# Patient Record
Sex: Male | Born: 1945 | Race: White | Hispanic: No | Marital: Married | Smoking: Never smoker
Health system: Southern US, Community
[De-identification: ages and names within clinical notes are randomized; demographics above are authoritative.]

## PROBLEM LIST (undated history)

## (undated) DIAGNOSIS — I2510 Atherosclerotic heart disease of native coronary artery without angina pectoris: Secondary | ICD-10-CM

## (undated) DIAGNOSIS — I722 Aneurysm of renal artery: Secondary | ICD-10-CM

## (undated) DIAGNOSIS — I679 Cerebrovascular disease, unspecified: Secondary | ICD-10-CM

## (undated) DIAGNOSIS — F329 Major depressive disorder, single episode, unspecified: Secondary | ICD-10-CM

## (undated) DIAGNOSIS — I6381 Other cerebral infarction due to occlusion or stenosis of small artery: Secondary | ICD-10-CM

## (undated) DIAGNOSIS — G459 Transient cerebral ischemic attack, unspecified: Secondary | ICD-10-CM

## (undated) DIAGNOSIS — E785 Hyperlipidemia, unspecified: Secondary | ICD-10-CM

## (undated) DIAGNOSIS — I779 Disorder of arteries and arterioles, unspecified: Secondary | ICD-10-CM

## (undated) DIAGNOSIS — I10 Essential (primary) hypertension: Secondary | ICD-10-CM

## (undated) DIAGNOSIS — E119 Type 2 diabetes mellitus without complications: Secondary | ICD-10-CM

## (undated) DIAGNOSIS — J439 Emphysema, unspecified: Secondary | ICD-10-CM

## (undated) DIAGNOSIS — H9190 Unspecified hearing loss, unspecified ear: Secondary | ICD-10-CM

## (undated) DIAGNOSIS — I4891 Unspecified atrial fibrillation: Secondary | ICD-10-CM

## (undated) DIAGNOSIS — F32A Depression, unspecified: Secondary | ICD-10-CM

## (undated) HISTORY — DX: Emphysema, unspecified: J43.9

## (undated) HISTORY — DX: Hyperlipidemia, unspecified: E78.5

## (undated) HISTORY — DX: Depression, unspecified: F32.A

## (undated) HISTORY — DX: Cerebrovascular disease, unspecified: I67.9

## (undated) HISTORY — DX: Other cerebral infarction due to occlusion or stenosis of small artery: I63.81

## (undated) HISTORY — DX: Major depressive disorder, single episode, unspecified: F32.9

---

## 2012-08-06 HISTORY — PX: COLONOSCOPY: SHX174

## 2012-11-17 HISTORY — PX: SIGMOIDOSCOPY: SHX6686

## 2013-11-12 ENCOUNTER — Ambulatory Visit: Payer: Self-pay

## 2014-04-08 DIAGNOSIS — Z7901 Long term (current) use of anticoagulants: Secondary | ICD-10-CM

## 2014-04-08 DIAGNOSIS — E785 Hyperlipidemia, unspecified: Secondary | ICD-10-CM | POA: Insufficient documentation

## 2014-04-08 DIAGNOSIS — I10 Essential (primary) hypertension: Secondary | ICD-10-CM

## 2014-04-08 HISTORY — DX: Essential (primary) hypertension: I10

## 2014-04-08 HISTORY — DX: Long term (current) use of anticoagulants: Z79.01

## 2014-04-09 HISTORY — PX: CAROTID ENDARTERECTOMY: SUR193

## 2015-05-04 DIAGNOSIS — E785 Hyperlipidemia, unspecified: Secondary | ICD-10-CM | POA: Insufficient documentation

## 2015-05-04 DIAGNOSIS — I722 Aneurysm of renal artery: Secondary | ICD-10-CM

## 2015-05-04 DIAGNOSIS — I482 Chronic atrial fibrillation, unspecified: Secondary | ICD-10-CM

## 2015-05-04 HISTORY — DX: Hyperlipidemia, unspecified: E78.5

## 2015-05-04 HISTORY — DX: Chronic atrial fibrillation, unspecified: I48.20

## 2015-05-04 HISTORY — DX: Aneurysm of renal artery: I72.2

## 2015-11-10 DIAGNOSIS — M7742 Metatarsalgia, left foot: Secondary | ICD-10-CM | POA: Diagnosis not present

## 2015-11-10 DIAGNOSIS — M2042 Other hammer toe(s) (acquired), left foot: Secondary | ICD-10-CM | POA: Diagnosis not present

## 2015-12-07 DIAGNOSIS — I10 Essential (primary) hypertension: Secondary | ICD-10-CM | POA: Diagnosis not present

## 2015-12-07 DIAGNOSIS — I722 Aneurysm of renal artery: Secondary | ICD-10-CM | POA: Diagnosis not present

## 2015-12-07 DIAGNOSIS — I482 Chronic atrial fibrillation: Secondary | ICD-10-CM | POA: Diagnosis not present

## 2015-12-07 DIAGNOSIS — I6523 Occlusion and stenosis of bilateral carotid arteries: Secondary | ICD-10-CM | POA: Diagnosis not present

## 2015-12-15 DIAGNOSIS — I129 Hypertensive chronic kidney disease with stage 1 through stage 4 chronic kidney disease, or unspecified chronic kidney disease: Secondary | ICD-10-CM | POA: Diagnosis not present

## 2015-12-15 DIAGNOSIS — E559 Vitamin D deficiency, unspecified: Secondary | ICD-10-CM | POA: Diagnosis not present

## 2015-12-15 DIAGNOSIS — E1122 Type 2 diabetes mellitus with diabetic chronic kidney disease: Secondary | ICD-10-CM | POA: Diagnosis not present

## 2015-12-15 DIAGNOSIS — I1310 Hypertensive heart and chronic kidney disease without heart failure, with stage 1 through stage 4 chronic kidney disease, or unspecified chronic kidney disease: Secondary | ICD-10-CM | POA: Diagnosis not present

## 2015-12-15 DIAGNOSIS — N182 Chronic kidney disease, stage 2 (mild): Secondary | ICD-10-CM | POA: Diagnosis not present

## 2015-12-15 DIAGNOSIS — E785 Hyperlipidemia, unspecified: Secondary | ICD-10-CM | POA: Diagnosis not present

## 2015-12-15 DIAGNOSIS — M109 Gout, unspecified: Secondary | ICD-10-CM | POA: Diagnosis not present

## 2015-12-26 DIAGNOSIS — E119 Type 2 diabetes mellitus without complications: Secondary | ICD-10-CM | POA: Diagnosis not present

## 2015-12-26 DIAGNOSIS — H40003 Preglaucoma, unspecified, bilateral: Secondary | ICD-10-CM | POA: Diagnosis not present

## 2016-11-01 DIAGNOSIS — Z79899 Other long term (current) drug therapy: Secondary | ICD-10-CM | POA: Diagnosis not present

## 2016-11-01 DIAGNOSIS — E1122 Type 2 diabetes mellitus with diabetic chronic kidney disease: Secondary | ICD-10-CM | POA: Diagnosis not present

## 2016-11-01 DIAGNOSIS — I129 Hypertensive chronic kidney disease with stage 1 through stage 4 chronic kidney disease, or unspecified chronic kidney disease: Secondary | ICD-10-CM | POA: Diagnosis not present

## 2016-11-01 DIAGNOSIS — N182 Chronic kidney disease, stage 2 (mild): Secondary | ICD-10-CM | POA: Diagnosis not present

## 2016-11-05 DIAGNOSIS — I10 Essential (primary) hypertension: Secondary | ICD-10-CM | POA: Diagnosis not present

## 2016-11-05 DIAGNOSIS — E118 Type 2 diabetes mellitus with unspecified complications: Secondary | ICD-10-CM | POA: Diagnosis not present

## 2016-11-05 DIAGNOSIS — I6523 Occlusion and stenosis of bilateral carotid arteries: Secondary | ICD-10-CM | POA: Diagnosis not present

## 2016-11-05 DIAGNOSIS — I722 Aneurysm of renal artery: Secondary | ICD-10-CM | POA: Diagnosis not present

## 2016-11-05 DIAGNOSIS — I482 Chronic atrial fibrillation: Secondary | ICD-10-CM | POA: Diagnosis not present

## 2016-11-05 DIAGNOSIS — E785 Hyperlipidemia, unspecified: Secondary | ICD-10-CM | POA: Diagnosis not present

## 2016-11-27 DIAGNOSIS — I129 Hypertensive chronic kidney disease with stage 1 through stage 4 chronic kidney disease, or unspecified chronic kidney disease: Secondary | ICD-10-CM | POA: Diagnosis not present

## 2016-11-27 DIAGNOSIS — I1310 Hypertensive heart and chronic kidney disease without heart failure, with stage 1 through stage 4 chronic kidney disease, or unspecified chronic kidney disease: Secondary | ICD-10-CM | POA: Diagnosis not present

## 2016-11-27 DIAGNOSIS — I4891 Unspecified atrial fibrillation: Secondary | ICD-10-CM | POA: Diagnosis not present

## 2016-11-27 DIAGNOSIS — M109 Gout, unspecified: Secondary | ICD-10-CM | POA: Diagnosis not present

## 2016-11-27 DIAGNOSIS — E785 Hyperlipidemia, unspecified: Secondary | ICD-10-CM | POA: Diagnosis not present

## 2016-11-27 DIAGNOSIS — E669 Obesity, unspecified: Secondary | ICD-10-CM | POA: Diagnosis not present

## 2016-11-27 DIAGNOSIS — N182 Chronic kidney disease, stage 2 (mild): Secondary | ICD-10-CM | POA: Diagnosis not present

## 2016-11-27 DIAGNOSIS — E1122 Type 2 diabetes mellitus with diabetic chronic kidney disease: Secondary | ICD-10-CM | POA: Diagnosis not present

## 2016-11-27 DIAGNOSIS — Z79899 Other long term (current) drug therapy: Secondary | ICD-10-CM | POA: Diagnosis not present

## 2016-11-27 DIAGNOSIS — Z87898 Personal history of other specified conditions: Secondary | ICD-10-CM | POA: Diagnosis not present

## 2017-03-10 DIAGNOSIS — M65311 Trigger thumb, right thumb: Secondary | ICD-10-CM | POA: Diagnosis not present

## 2017-03-12 DIAGNOSIS — M722 Plantar fascial fibromatosis: Secondary | ICD-10-CM | POA: Diagnosis not present

## 2017-03-12 DIAGNOSIS — M6702 Short Achilles tendon (acquired), left ankle: Secondary | ICD-10-CM | POA: Insufficient documentation

## 2017-03-12 HISTORY — DX: Short Achilles tendon (acquired), left ankle: M67.02

## 2017-03-14 DIAGNOSIS — M65311 Trigger thumb, right thumb: Secondary | ICD-10-CM | POA: Diagnosis not present

## 2017-03-14 DIAGNOSIS — M79641 Pain in right hand: Secondary | ICD-10-CM | POA: Diagnosis not present

## 2017-04-11 DIAGNOSIS — M65311 Trigger thumb, right thumb: Secondary | ICD-10-CM | POA: Diagnosis not present

## 2017-04-11 DIAGNOSIS — M79641 Pain in right hand: Secondary | ICD-10-CM | POA: Diagnosis not present

## 2017-05-02 DIAGNOSIS — I722 Aneurysm of renal artery: Secondary | ICD-10-CM | POA: Diagnosis not present

## 2017-05-02 DIAGNOSIS — I6521 Occlusion and stenosis of right carotid artery: Secondary | ICD-10-CM | POA: Diagnosis not present

## 2017-05-02 DIAGNOSIS — I701 Atherosclerosis of renal artery: Secondary | ICD-10-CM | POA: Diagnosis not present

## 2017-05-08 DIAGNOSIS — N182 Chronic kidney disease, stage 2 (mild): Secondary | ICD-10-CM | POA: Diagnosis not present

## 2017-05-08 DIAGNOSIS — I4891 Unspecified atrial fibrillation: Secondary | ICD-10-CM | POA: Diagnosis not present

## 2017-05-08 DIAGNOSIS — E785 Hyperlipidemia, unspecified: Secondary | ICD-10-CM | POA: Diagnosis not present

## 2017-05-08 DIAGNOSIS — I129 Hypertensive chronic kidney disease with stage 1 through stage 4 chronic kidney disease, or unspecified chronic kidney disease: Secondary | ICD-10-CM | POA: Diagnosis not present

## 2017-05-08 DIAGNOSIS — M109 Gout, unspecified: Secondary | ICD-10-CM | POA: Diagnosis not present

## 2017-05-08 DIAGNOSIS — E1122 Type 2 diabetes mellitus with diabetic chronic kidney disease: Secondary | ICD-10-CM | POA: Diagnosis not present

## 2017-05-08 DIAGNOSIS — E559 Vitamin D deficiency, unspecified: Secondary | ICD-10-CM | POA: Diagnosis not present

## 2017-05-08 DIAGNOSIS — I1310 Hypertensive heart and chronic kidney disease without heart failure, with stage 1 through stage 4 chronic kidney disease, or unspecified chronic kidney disease: Secondary | ICD-10-CM | POA: Diagnosis not present

## 2017-05-08 DIAGNOSIS — Z79899 Other long term (current) drug therapy: Secondary | ICD-10-CM | POA: Diagnosis not present

## 2017-05-14 ENCOUNTER — Ambulatory Visit (INDEPENDENT_AMBULATORY_CARE_PROVIDER_SITE_OTHER): Payer: PPO | Admitting: Cardiology

## 2017-05-14 ENCOUNTER — Encounter: Payer: Self-pay | Admitting: Cardiology

## 2017-05-14 VITALS — BP 138/86 | HR 57 | Ht 77.0 in | Wt 294.1 lb

## 2017-05-14 DIAGNOSIS — E785 Hyperlipidemia, unspecified: Secondary | ICD-10-CM | POA: Diagnosis not present

## 2017-05-14 DIAGNOSIS — Z7901 Long term (current) use of anticoagulants: Secondary | ICD-10-CM | POA: Diagnosis not present

## 2017-05-14 DIAGNOSIS — I722 Aneurysm of renal artery: Secondary | ICD-10-CM | POA: Diagnosis not present

## 2017-05-14 DIAGNOSIS — I10 Essential (primary) hypertension: Secondary | ICD-10-CM

## 2017-05-14 DIAGNOSIS — Z9889 Other specified postprocedural states: Secondary | ICD-10-CM

## 2017-05-14 DIAGNOSIS — E0840 Diabetes mellitus due to underlying condition with diabetic neuropathy, unspecified: Secondary | ICD-10-CM | POA: Diagnosis not present

## 2017-05-14 DIAGNOSIS — I6522 Occlusion and stenosis of left carotid artery: Secondary | ICD-10-CM

## 2017-05-14 DIAGNOSIS — I482 Chronic atrial fibrillation, unspecified: Secondary | ICD-10-CM

## 2017-05-14 DIAGNOSIS — I6529 Occlusion and stenosis of unspecified carotid artery: Secondary | ICD-10-CM | POA: Insufficient documentation

## 2017-05-14 HISTORY — DX: Occlusion and stenosis of unspecified carotid artery: I65.29

## 2017-05-14 HISTORY — DX: Other specified postprocedural states: Z98.890

## 2017-05-14 NOTE — Progress Notes (Signed)
Cardiology Office Note:    Date:  05/14/2017   ID:  Ryan Vaughan, DOB 03/30/1946, MRN [id number]  PCP:  Melony Overly, MD  Cardiologist:  Jenean Lindau, MD   Referring MD: Melony Overly, MD    ASSESSMENT:    1. Chronic atrial fibrillation ([place])   2. H/O carotid endarterectomy   3. Stenosis of left carotid artery   4. Essential hypertension   5. 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 Diabetes mellitus due to underlying condition with diabetic neuropathy, without long-term current use of insulin ([place])   7. Chronic anticoagulation   8. Dyslipidemia    PLAN:    In order of problems listed above:  1. I discussed with the patient atrial fibrillation, disease process. Management and therapy including rate and rhythm control, anticoagulation benefits and potential risks were discussed extensively with the patient. Patient had multiple questions which were answered to patient's satisfaction. 2. Patient's blood pressure stable. Lipids are followed by his primary care physician and we will await a copy of those records. 3. We will monitor and follow his lipids aggressively once they're available from his primary care physician. This is in light of his extensive atherosclerotic vascular disease documented on carotid and renal arterial Dopplers. 4. Diet was discussed with dyslipidemia and diabetes mellitus and obesity. This of obesity explained and he verbalized understanding. Importance of regular exercise stressed. He will be seen in follow-up appointment in 6 months or earlier if he has any concerns. Total time for this evaluation was 35 minutes.   Medication Adjustments/Labs and Tests Ordered: Current medicines are reviewed at length with the patient today.  Concerns regarding medicines are outlined above.  No orders of the defined types were placed in this encounter.  No orders of the defined types were placed in this encounter.    History of Present Illness:    Ryan Vaughan is a 71 y.o. male who is being seen today for the evaluation of Chronic atrial fibrillation at the request of Melony Overly, MD. Patient is a pleasant 71 year old male with past medical history of atherosclerotic vascular disease, chronic atrial fibrillation on anticoagulation, diabetes mellitus and dyslipidemia and obesity. I have taken care of him in the past in the previous practice and he transferred his care here at this time and wants to be established. He denies any problems at this time and takes care of activities of daily living. No chest pain orthopnea or PND. At the time of my evaluation is alert awake oriented and in no distress. He walks on a regular basis as his health departments and the weather permits. Patient mentions to me he had evaluation done on his renal arteries and carotid arteries by his vascular surgeon.  History reviewed. No pertinent past medical history.  History reviewed. No pertinent surgical history.  Current Medications: Current Meds  Medication Sig  . amLODipine (NORVASC) 5 MG tablet Take 5 mg by mouth daily.  Marland Kitchen aspirin EC 81 MG tablet Take 81 mg by mouth daily.  Marland Kitchen atorvastatin (LIPITOR) 80 MG tablet Take 80 mg by mouth daily.  . carvedilol (COREG) 12.5 MG tablet Take 12.5 mg by mouth 2 (two) times daily.  Marland Kitchen DIGOX 250 MCG tablet Take 0.25 mg by mouth daily.  . empagliflozin (JARDIANCE) 25 MG TABS tablet Take 25 mg by mouth daily.  Marland Kitchen gabapentin (NEURONTIN) 800 MG tablet Take 800 mg by mouth daily as needed.  . hydrALAZINE (APRESOLINE) 10 MG tablet Take  10 mg by mouth daily.  Marland Kitchen losartan (COZAAR) 100 MG tablet Take 100 mg by mouth.  . nitroGLYCERIN (NITROSTAT) 0.4 MG SL tablet Place 0.4 mg under the tongue.  . rivaroxaban (XARELTO) 20 MG TABS tablet Take 20 mg by mouth daily.  Marland Kitchen VICTOZA 18 MG/3ML SOPN Inject 1.6 mg into the skin daily.     Allergies:   Penicillins   Social History   Social History  . Marital status: Married    Spouse name: N/A   . Number of children: N/A  . Years of education: N/A   Social History Main Topics  . Smoking status: Never Smoker  . Smokeless tobacco: Never Used  . Alcohol use No  . Drug use: No  . Sexual activity: Not Asked   Other Topics Concern  . None   Social History Narrative  . None     Family History: The patient's family history is not on file.  ROS:   Please see the history of present illness.    All other systems reviewed and are negative.  EKGs/Labs/Other Studies Reviewed:    The following studies were reviewed today: I reviewed his carotid Doppler and renal arterial ultrasounds and discussed these results with him. I told him to wait for his doctor who will perform these tests for an official evaluation and interpretation of these results and localized understanding. I reviewed previous office records extensively. Patient has had blood work with his primary care physician and he has told his doctor to send me those records and I will review them when available.   Recent Labs: No results found for requested labs within last 8760 hours.  Recent Lipid Panel No results found for: CHOL, TRIG, HDL, CHOLHDL, VLDL, LDLCALC, LDLDIRECT  Physical Exam:    VS:  BP 138/86   Pulse (!) 57   Ht 6\' 5"  (1.956 m)   Wt 294 lb 0.8 oz (133.4 kg)   SpO2 98%   BMI 34.87 kg/m     Wt Readings from Last 3 Encounters:  05/14/17 294 lb 0.8 oz (133.4 kg)     GEN: Patient is in no acute distress HEENT: Normal NECK: No JVD; No carotid bruits LYMPHATICS: No lymphadenopathy CARDIAC: S1 S2 irregular, 2/6 systolic murmur at the apex. RESPIRATORY:  Clear to auscultation without rales, wheezing or rhonchi  ABDOMEN: Soft, non-tender, non-distended MUSCULOSKELETAL:  No edema; No deformity  SKIN: Warm and dry NEUROLOGIC:  Alert and oriented x 3 PSYCHIATRIC:  Normal affect    Signed, Jenean Lindau, MD  05/14/2017 8:45 AM    [facility]

## 2017-05-14 NOTE — Patient Instructions (Signed)
Medication Instructions:  Your physician recommends that you continue on your current medications as directed. Please refer to the Current Medication list given to you today. Please complete the patient assistance form for Xarelto.   Labwork: None   Testing/Procedures: None   Follow-Up: Your physician wants you to follow-up in: 6 Months. You will receive a reminder letter in the mail two months in advance. If you don't receive a letter, please call our office to schedule the follow-up appointment.  Any Other Special Instructions Will Be Listed Below (If Applicable).  Please note that any paperwork needing to be filled out by the provider will need to be addressed at the front desk prior to seeing the provider. Please note that any paperwork FMLA, Disability or other documents regarding health condition is subject to a $25.00 charge that must be received prior to completion of paperwork.    If you need a refill on your cardiac medications before your next appointment, please call your pharmacy.

## 2017-05-24 DIAGNOSIS — I722 Aneurysm of renal artery: Secondary | ICD-10-CM | POA: Diagnosis not present

## 2017-05-24 DIAGNOSIS — I6521 Occlusion and stenosis of right carotid artery: Secondary | ICD-10-CM | POA: Diagnosis not present

## 2017-05-24 DIAGNOSIS — I701 Atherosclerosis of renal artery: Secondary | ICD-10-CM | POA: Diagnosis not present

## 2017-05-24 DIAGNOSIS — Z48812 Encounter for surgical aftercare following surgery on the circulatory system: Secondary | ICD-10-CM | POA: Diagnosis not present

## 2017-05-30 DIAGNOSIS — E119 Type 2 diabetes mellitus without complications: Secondary | ICD-10-CM | POA: Diagnosis not present

## 2017-05-30 DIAGNOSIS — H40003 Preglaucoma, unspecified, bilateral: Secondary | ICD-10-CM | POA: Diagnosis not present

## 2017-06-11 DIAGNOSIS — I129 Hypertensive chronic kidney disease with stage 1 through stage 4 chronic kidney disease, or unspecified chronic kidney disease: Secondary | ICD-10-CM | POA: Diagnosis not present

## 2017-06-11 DIAGNOSIS — E1122 Type 2 diabetes mellitus with diabetic chronic kidney disease: Secondary | ICD-10-CM | POA: Diagnosis not present

## 2017-06-11 DIAGNOSIS — N182 Chronic kidney disease, stage 2 (mild): Secondary | ICD-10-CM | POA: Diagnosis not present

## 2017-07-05 DIAGNOSIS — I129 Hypertensive chronic kidney disease with stage 1 through stage 4 chronic kidney disease, or unspecified chronic kidney disease: Secondary | ICD-10-CM | POA: Diagnosis not present

## 2017-07-05 DIAGNOSIS — N182 Chronic kidney disease, stage 2 (mild): Secondary | ICD-10-CM | POA: Diagnosis not present

## 2017-07-05 DIAGNOSIS — E1122 Type 2 diabetes mellitus with diabetic chronic kidney disease: Secondary | ICD-10-CM | POA: Diagnosis not present

## 2017-07-05 DIAGNOSIS — Z23 Encounter for immunization: Secondary | ICD-10-CM | POA: Diagnosis not present

## 2017-08-05 DIAGNOSIS — I4891 Unspecified atrial fibrillation: Secondary | ICD-10-CM | POA: Diagnosis not present

## 2017-08-05 DIAGNOSIS — I160 Hypertensive urgency: Secondary | ICD-10-CM | POA: Diagnosis not present

## 2017-08-05 DIAGNOSIS — R531 Weakness: Secondary | ICD-10-CM | POA: Diagnosis not present

## 2017-08-05 DIAGNOSIS — R079 Chest pain, unspecified: Secondary | ICD-10-CM | POA: Diagnosis not present

## 2017-08-05 DIAGNOSIS — N182 Chronic kidney disease, stage 2 (mild): Secondary | ICD-10-CM | POA: Diagnosis not present

## 2017-08-05 DIAGNOSIS — Z87891 Personal history of nicotine dependence: Secondary | ICD-10-CM | POA: Diagnosis not present

## 2017-08-05 DIAGNOSIS — E785 Hyperlipidemia, unspecified: Secondary | ICD-10-CM | POA: Diagnosis not present

## 2017-08-05 DIAGNOSIS — E1122 Type 2 diabetes mellitus with diabetic chronic kidney disease: Secondary | ICD-10-CM | POA: Diagnosis not present

## 2017-08-05 DIAGNOSIS — Z8673 Personal history of transient ischemic attack (TIA), and cerebral infarction without residual deficits: Secondary | ICD-10-CM | POA: Diagnosis not present

## 2017-08-05 DIAGNOSIS — Z6836 Body mass index (BMI) 36.0-36.9, adult: Secondary | ICD-10-CM | POA: Diagnosis not present

## 2017-08-05 DIAGNOSIS — Z713 Dietary counseling and surveillance: Secondary | ICD-10-CM | POA: Diagnosis not present

## 2017-08-05 DIAGNOSIS — Z79899 Other long term (current) drug therapy: Secondary | ICD-10-CM | POA: Diagnosis not present

## 2017-08-05 DIAGNOSIS — Z7982 Long term (current) use of aspirin: Secondary | ICD-10-CM | POA: Diagnosis not present

## 2017-08-05 DIAGNOSIS — E119 Type 2 diabetes mellitus without complications: Secondary | ICD-10-CM | POA: Diagnosis not present

## 2017-08-05 DIAGNOSIS — I480 Paroxysmal atrial fibrillation: Secondary | ICD-10-CM | POA: Diagnosis not present

## 2017-08-05 DIAGNOSIS — I10 Essential (primary) hypertension: Secondary | ICD-10-CM | POA: Diagnosis not present

## 2017-08-05 DIAGNOSIS — R0789 Other chest pain: Secondary | ICD-10-CM | POA: Diagnosis not present

## 2017-08-05 DIAGNOSIS — I1310 Hypertensive heart and chronic kidney disease without heart failure, with stage 1 through stage 4 chronic kidney disease, or unspecified chronic kidney disease: Secondary | ICD-10-CM | POA: Diagnosis not present

## 2017-08-05 DIAGNOSIS — I129 Hypertensive chronic kidney disease with stage 1 through stage 4 chronic kidney disease, or unspecified chronic kidney disease: Secondary | ICD-10-CM | POA: Diagnosis not present

## 2017-08-05 DIAGNOSIS — R0602 Shortness of breath: Secondary | ICD-10-CM | POA: Diagnosis not present

## 2017-08-06 DIAGNOSIS — I4891 Unspecified atrial fibrillation: Secondary | ICD-10-CM | POA: Diagnosis not present

## 2017-08-06 DIAGNOSIS — E785 Hyperlipidemia, unspecified: Secondary | ICD-10-CM | POA: Diagnosis not present

## 2017-08-06 DIAGNOSIS — I10 Essential (primary) hypertension: Secondary | ICD-10-CM | POA: Diagnosis not present

## 2017-08-06 DIAGNOSIS — R079 Chest pain, unspecified: Secondary | ICD-10-CM | POA: Diagnosis not present

## 2017-08-07 ENCOUNTER — Other Ambulatory Visit: Payer: Self-pay | Admitting: Cardiology

## 2017-08-12 DIAGNOSIS — E1122 Type 2 diabetes mellitus with diabetic chronic kidney disease: Secondary | ICD-10-CM | POA: Diagnosis not present

## 2017-08-12 DIAGNOSIS — Z79899 Other long term (current) drug therapy: Secondary | ICD-10-CM | POA: Diagnosis not present

## 2017-08-12 DIAGNOSIS — I4891 Unspecified atrial fibrillation: Secondary | ICD-10-CM | POA: Diagnosis not present

## 2017-08-12 DIAGNOSIS — N182 Chronic kidney disease, stage 2 (mild): Secondary | ICD-10-CM | POA: Diagnosis not present

## 2017-08-12 DIAGNOSIS — I129 Hypertensive chronic kidney disease with stage 1 through stage 4 chronic kidney disease, or unspecified chronic kidney disease: Secondary | ICD-10-CM | POA: Diagnosis not present

## 2017-08-12 DIAGNOSIS — R079 Chest pain, unspecified: Secondary | ICD-10-CM | POA: Diagnosis not present

## 2017-08-12 DIAGNOSIS — I1310 Hypertensive heart and chronic kidney disease without heart failure, with stage 1 through stage 4 chronic kidney disease, or unspecified chronic kidney disease: Secondary | ICD-10-CM | POA: Diagnosis not present

## 2017-08-13 ENCOUNTER — Other Ambulatory Visit: Payer: Self-pay

## 2017-08-13 ENCOUNTER — Encounter: Payer: Self-pay | Admitting: Cardiology

## 2017-08-13 ENCOUNTER — Ambulatory Visit: Payer: PPO | Admitting: Cardiology

## 2017-08-13 VITALS — BP 136/80 | HR 58 | Ht 77.0 in | Wt 289.1 lb

## 2017-08-13 DIAGNOSIS — E0840 Diabetes mellitus due to underlying condition with diabetic neuropathy, unspecified: Secondary | ICD-10-CM

## 2017-08-13 DIAGNOSIS — I6529 Occlusion and stenosis of unspecified carotid artery: Secondary | ICD-10-CM

## 2017-08-13 DIAGNOSIS — I722 Aneurysm of renal artery: Secondary | ICD-10-CM

## 2017-08-13 DIAGNOSIS — Z7901 Long term (current) use of anticoagulants: Secondary | ICD-10-CM | POA: Diagnosis not present

## 2017-08-13 DIAGNOSIS — Z9889 Other specified postprocedural states: Secondary | ICD-10-CM

## 2017-08-13 DIAGNOSIS — E785 Hyperlipidemia, unspecified: Secondary | ICD-10-CM | POA: Diagnosis not present

## 2017-08-13 DIAGNOSIS — I482 Chronic atrial fibrillation, unspecified: Secondary | ICD-10-CM

## 2017-08-13 DIAGNOSIS — I10 Essential (primary) hypertension: Secondary | ICD-10-CM | POA: Diagnosis not present

## 2017-08-13 MED ORDER — CARVEDILOL 12.5 MG PO TABS: 12.5000 mg | ORAL_TABLET | Freq: Two times a day (BID) | ORAL | 2 refills | Status: DC

## 2017-08-13 MED ORDER — HYDRALAZINE HCL 10 MG PO TABS: 10.0000 mg | ORAL_TABLET | Freq: Every day | ORAL | 1 refills | Status: DC

## 2017-08-13 NOTE — Patient Instructions (Signed)
Medication Instructions:  Your physician recommends that you continue on your current medications as directed. Please refer to the Current Medication list given to you today.  Refill sent for Coreg Samples of Xarelto given  Labwork: None  Testing/Procedures: None  Follow-Up: Your physician recommends that you schedule a follow-up appointment in: 6 months   Any Other Special Instructions Will Be Listed Below (If Applicable).     If you need a refill on your cardiac medications before your next appointment, please call your pharmacy.   [place], RN, BSN

## 2017-08-13 NOTE — Addendum Note (Signed)
Addended by: Mattie Marlin on: 08/13/2017 10:17 AM   Modules accepted: Orders

## 2017-08-13 NOTE — Progress Notes (Signed)
Cardiology Office Note:    Date:  08/13/2017   ID:  Ryan Vaughan, DOB 05/07/1946, MRN [id number]  PCP:  Melony Overly, MD  Cardiologist:  Jenean Lindau, MD   Referring MD: Melony Overly, MD    ASSESSMENT:    1. Stenosis of carotid artery, unspecified laterality   2. Chronic atrial fibrillation (H[facility])   3. Essential hypertension   4. 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 Chronic anticoagulation   6. 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 Dyslipidemia   8. H/O carotid endarterectomy    PLAN:    In order of problems listed above:  1. Secondary prevention stressed to the patient.  Importance of compliance with diet and medication stressed.  Lipids are followed by his primary care physician.  Diet was discussed with dyslipidemia and diabetes mellitus.  Importance of regular exercise and aerobic exercise and walking stressed and he has lived a sedentary lifestyle for the past several months.  I cautioned him against this. 2. Risks of obesity explaining vocalized understanding.  He is planning to diet and exercise regularly and lose more weight. 3.   I discussed with the patient atrial fibrillation, disease process. Management and therapy       including rate and rhythm control, anticoagulation benefits and potential risks were discussed extensively with the patient. Patient had multiple questions which were answered to patient's satisfaction. 4.    Patient will be seen in follow-up appointment in 6 months or earlier if the patient has any concerns   Medication Adjustments/Labs and Tests Ordered: Current medicines are reviewed at length with the patient today.  Concerns regarding medicines are outlined above.  No orders of the defined types were placed in this encounter.  Meds ordered this encounter  Medications  . carvedilol (COREG) 12.5 MG tablet    Sig: Take 1 tablet (12.5 mg total) 2 (two)  times daily by mouth.    Dispense:  180 tablet    Refill:  2     Chief Complaint  Patient presents with  . Hospitalization Follow-up  . Chest Pain     History of Present Illness:    Ryan Vaughan is a 71 y.o. male.  The patient has atherosclerotic vascular disease.  The patient has atrial fibrillation, essential hypertension and diabetes mellitus.  He denies any problems at this time and takes care of activities of daily living.  No chest pain orthopnea or PND.  At the time of my evaluation he is alert awake oriented and in no distress.  He was recently in the hospital with chest pain and elevated blood pressure.  He was evaluated and treated an echocardiogram and a stress test was unremarkable.  I reviewed hospital documents including lab work extensively.  History reviewed. No pertinent past medical history.  History reviewed. No pertinent surgical history.  Current Medications: Current Meds  Medication Sig  . amLODipine (NORVASC) 5 MG tablet Take 5 mg by mouth daily.  Marland Kitchen aspirin EC 81 MG tablet Take 81 mg by mouth daily.  Marland Kitchen atorvastatin (LIPITOR) 80 MG tablet TAKE 1 TABLET BY MOUTH ONCE DAILY  . carvedilol (COREG) 12.5 MG tablet Take 1 tablet (12.5 mg total) 2 (two) times daily by mouth.  Marland Kitchen DIGOX 250 MCG tablet Take 0.25 mg by mouth daily.  . empagliflozin (JARDIANCE) 25 MG TABS tablet Take 25 mg by mouth daily.  Marland Kitchen gabapentin (NEURONTIN) 800 MG  tablet Take 800 mg by mouth daily as needed.  . hydrALAZINE (APRESOLINE) 10 MG tablet Take 10 mg by mouth daily.  Marland Kitchen losartan (COZAAR) 100 MG tablet Take 100 mg by mouth.  . nitroGLYCERIN (NITROSTAT) 0.4 MG SL tablet Place 0.4 mg under the tongue.  . rivaroxaban (XARELTO) 20 MG TABS tablet Take 20 mg by mouth daily.  Marland Kitchen VICTOZA 18 MG/3ML SOPN Inject 1.6 mg into the skin daily.  . [DISCONTINUED] carvedilol (COREG) 12.5 MG tablet Take 12.5 mg by mouth 2 (two) times daily.     Allergies:   Penicillins   Social History   Socioeconomic  History  . Marital status: Married    Spouse name: None  . Number of children: None  . Years of education: None  . Highest education level: None  Social Needs  . Financial resource strain: None  . Food insecurity - worry: None  . Food insecurity - inability: None  . Transportation needs - medical: None  . Transportation needs - non-medical: None  Occupational History  . None  Tobacco Use  . Smoking status: Never Smoker  . Smokeless tobacco: Never Used  Substance and Sexual Activity  . Alcohol use: No  . Drug use: No  . Sexual activity: None  Other Topics Concern  . None  Social History Narrative  . None     Family History: The patient's family history is not on file.  ROS:   Please see the history of present illness.    All other systems reviewed and are negative.  EKGs/Labs/Other Studies Reviewed:    The following studies were reviewed today: I reviewed the hospital records extensively.  Stress test and echocardiogram reports were also reviewed.   Recent Labs: No results found for requested labs within last 8760 hours.  Recent Lipid Panel No results found for: CHOL, TRIG, HDL, CHOLHDL, VLDL, LDLCALC, LDLDIRECT  Physical Exam:    VS:  BP 136/80   Pulse (!) 58   Ht 6\' 5"  (1.956 m)   Wt 289 lb 1.9 oz (131.1 kg)   SpO2 98%   BMI 34.28 kg/m     Wt Readings from Last 3 Encounters:  08/13/17 289 lb 1.9 oz (131.1 kg)  05/14/17 294 lb 0.8 oz (133.4 kg)     GEN: Patient is in no acute distress HEENT: Normal NECK: No JVD; No carotid bruits LYMPHATICS: No lymphadenopathy CARDIAC: Hear sounds regular, 2/6 systolic murmur at the apex. RESPIRATORY:  Clear to auscultation without rales, wheezing or rhonchi  ABDOMEN: Soft, non-tender, non-distended MUSCULOSKELETAL:  No edema; No deformity  SKIN: Warm and dry NEUROLOGIC:  Alert and oriented x 3 PSYCHIATRIC:  Normal affect   Signed, Jenean Lindau, MD  08/13/2017 9:47 AM    [facility]

## 2017-08-13 NOTE — Patient Outreach (Signed)
[facility]) Care Management  08/13/2017  Ryan Vaughan 25-May-1946 [id number]  Referral Date: 08/13/17 Referral Source: [facility] Date of Admission: 08/05/17 Diagnosis: Chest pain and Increased Blood pressure Date of Discharge:08/06/17 Facility: [place]: [facility]TA  Outreach attempt # 1 Patient reports he went to the hospital for chest pain and increased blood pressure. He reports nothing was found and saw his heart doctor today and reports no changes in anything.  Patient checks his blood pressure at least daily.    Social: Patient lives with his wife who is supportive.    Conditions: Patient admits to heart stenosis, HTN, A. Fib., and DM  Medications: Patient reports he is taking all his medications and does not have any issues.   Appointments: Patient has transportation to appointments and saw heart doctor today.  Consent: RN CM reviewed [facility] services with patient. Patient denies any needs and no needs identified by nurse.    Plan: RN CM will close case at this time and notify care management assistant of case status.   RN CM will send letter and brochure for future reference.   Jone Baseman, RN, MSN [facility] Care Management Care Management Coordinator Direct Line [phone] Toll Free: [phone]  Fax: [phone]

## 2017-08-20 DIAGNOSIS — E785 Hyperlipidemia, unspecified: Secondary | ICD-10-CM | POA: Diagnosis not present

## 2017-08-20 DIAGNOSIS — I129 Hypertensive chronic kidney disease with stage 1 through stage 4 chronic kidney disease, or unspecified chronic kidney disease: Secondary | ICD-10-CM | POA: Diagnosis not present

## 2017-08-20 DIAGNOSIS — E669 Obesity, unspecified: Secondary | ICD-10-CM | POA: Diagnosis not present

## 2017-08-20 DIAGNOSIS — Z1211 Encounter for screening for malignant neoplasm of colon: Secondary | ICD-10-CM | POA: Diagnosis not present

## 2017-08-20 DIAGNOSIS — Z9181 History of falling: Secondary | ICD-10-CM | POA: Diagnosis not present

## 2017-08-20 DIAGNOSIS — E1122 Type 2 diabetes mellitus with diabetic chronic kidney disease: Secondary | ICD-10-CM | POA: Diagnosis not present

## 2017-08-20 DIAGNOSIS — I1310 Hypertensive heart and chronic kidney disease without heart failure, with stage 1 through stage 4 chronic kidney disease, or unspecified chronic kidney disease: Secondary | ICD-10-CM | POA: Diagnosis not present

## 2017-08-20 DIAGNOSIS — N182 Chronic kidney disease, stage 2 (mild): Secondary | ICD-10-CM | POA: Diagnosis not present

## 2017-08-20 DIAGNOSIS — Z125 Encounter for screening for malignant neoplasm of prostate: Secondary | ICD-10-CM | POA: Diagnosis not present

## 2017-08-20 DIAGNOSIS — E559 Vitamin D deficiency, unspecified: Secondary | ICD-10-CM | POA: Diagnosis not present

## 2017-08-20 DIAGNOSIS — Z0000 Encounter for general adult medical examination without abnormal findings: Secondary | ICD-10-CM | POA: Diagnosis not present

## 2017-08-20 DIAGNOSIS — Z1331 Encounter for screening for depression: Secondary | ICD-10-CM | POA: Diagnosis not present

## 2017-09-12 DIAGNOSIS — I4891 Unspecified atrial fibrillation: Secondary | ICD-10-CM | POA: Diagnosis not present

## 2017-09-12 DIAGNOSIS — E559 Vitamin D deficiency, unspecified: Secondary | ICD-10-CM | POA: Diagnosis not present

## 2017-09-12 DIAGNOSIS — E785 Hyperlipidemia, unspecified: Secondary | ICD-10-CM | POA: Diagnosis not present

## 2017-09-12 DIAGNOSIS — M109 Gout, unspecified: Secondary | ICD-10-CM | POA: Diagnosis not present

## 2017-09-12 DIAGNOSIS — I1310 Hypertensive heart and chronic kidney disease without heart failure, with stage 1 through stage 4 chronic kidney disease, or unspecified chronic kidney disease: Secondary | ICD-10-CM | POA: Diagnosis not present

## 2017-09-12 DIAGNOSIS — I129 Hypertensive chronic kidney disease with stage 1 through stage 4 chronic kidney disease, or unspecified chronic kidney disease: Secondary | ICD-10-CM | POA: Diagnosis not present

## 2017-09-12 DIAGNOSIS — E1122 Type 2 diabetes mellitus with diabetic chronic kidney disease: Secondary | ICD-10-CM | POA: Diagnosis not present

## 2017-09-12 DIAGNOSIS — N182 Chronic kidney disease, stage 2 (mild): Secondary | ICD-10-CM | POA: Diagnosis not present

## 2017-10-10 DIAGNOSIS — J22 Unspecified acute lower respiratory infection: Secondary | ICD-10-CM | POA: Diagnosis not present

## 2017-10-10 DIAGNOSIS — R05 Cough: Secondary | ICD-10-CM | POA: Diagnosis not present

## 2017-10-17 DIAGNOSIS — J209 Acute bronchitis, unspecified: Secondary | ICD-10-CM | POA: Diagnosis not present

## 2017-10-17 DIAGNOSIS — J0100 Acute maxillary sinusitis, unspecified: Secondary | ICD-10-CM | POA: Diagnosis not present

## 2017-10-19 ENCOUNTER — Other Ambulatory Visit: Payer: Self-pay | Admitting: Cardiology

## 2017-11-11 DIAGNOSIS — H2513 Age-related nuclear cataract, bilateral: Secondary | ICD-10-CM | POA: Diagnosis not present

## 2017-11-11 DIAGNOSIS — H40003 Preglaucoma, unspecified, bilateral: Secondary | ICD-10-CM | POA: Diagnosis not present

## 2017-11-11 DIAGNOSIS — H524 Presbyopia: Secondary | ICD-10-CM | POA: Diagnosis not present

## 2017-11-11 DIAGNOSIS — M6702 Short Achilles tendon (acquired), left ankle: Secondary | ICD-10-CM | POA: Diagnosis not present

## 2017-11-11 DIAGNOSIS — M722 Plantar fascial fibromatosis: Secondary | ICD-10-CM | POA: Diagnosis not present

## 2017-12-02 ENCOUNTER — Telehealth: Payer: Self-pay | Admitting: Cardiology

## 2017-12-02 NOTE — Telephone Encounter (Signed)
Wants samples of Xarelto (wife has an appt tomorrow and wants it sent with her)

## 2017-12-03 NOTE — Telephone Encounter (Signed)
Samples will be given today.

## 2018-01-14 DIAGNOSIS — I4891 Unspecified atrial fibrillation: Secondary | ICD-10-CM | POA: Diagnosis not present

## 2018-01-14 DIAGNOSIS — M109 Gout, unspecified: Secondary | ICD-10-CM | POA: Diagnosis not present

## 2018-01-14 DIAGNOSIS — E1129 Type 2 diabetes mellitus with other diabetic kidney complication: Secondary | ICD-10-CM | POA: Diagnosis not present

## 2018-01-14 DIAGNOSIS — E559 Vitamin D deficiency, unspecified: Secondary | ICD-10-CM | POA: Diagnosis not present

## 2018-01-14 DIAGNOSIS — E785 Hyperlipidemia, unspecified: Secondary | ICD-10-CM | POA: Diagnosis not present

## 2018-01-14 DIAGNOSIS — I10 Essential (primary) hypertension: Secondary | ICD-10-CM | POA: Diagnosis not present

## 2018-01-14 DIAGNOSIS — Z139 Encounter for screening, unspecified: Secondary | ICD-10-CM | POA: Diagnosis not present

## 2018-01-14 DIAGNOSIS — Z79899 Other long term (current) drug therapy: Secondary | ICD-10-CM | POA: Diagnosis not present

## 2018-01-14 DIAGNOSIS — Z6841 Body Mass Index (BMI) 40.0 and over, adult: Secondary | ICD-10-CM | POA: Diagnosis not present

## 2018-01-24 DIAGNOSIS — M109 Gout, unspecified: Secondary | ICD-10-CM

## 2018-01-24 DIAGNOSIS — M1712 Unilateral primary osteoarthritis, left knee: Secondary | ICD-10-CM

## 2018-01-24 DIAGNOSIS — E669 Obesity, unspecified: Secondary | ICD-10-CM | POA: Insufficient documentation

## 2018-01-24 HISTORY — DX: Unilateral primary osteoarthritis, left knee: M17.12

## 2018-01-24 HISTORY — DX: Gout, unspecified: M10.9

## 2018-01-27 DIAGNOSIS — Z1389 Encounter for screening for other disorder: Secondary | ICD-10-CM | POA: Diagnosis not present

## 2018-01-27 DIAGNOSIS — Z0000 Encounter for general adult medical examination without abnormal findings: Secondary | ICD-10-CM | POA: Diagnosis not present

## 2018-01-27 DIAGNOSIS — Z6833 Body mass index (BMI) 33.0-33.9, adult: Secondary | ICD-10-CM | POA: Diagnosis not present

## 2018-01-27 DIAGNOSIS — Z136 Encounter for screening for cardiovascular disorders: Secondary | ICD-10-CM | POA: Diagnosis not present

## 2018-01-27 DIAGNOSIS — E785 Hyperlipidemia, unspecified: Secondary | ICD-10-CM | POA: Diagnosis not present

## 2018-01-27 DIAGNOSIS — Z1331 Encounter for screening for depression: Secondary | ICD-10-CM | POA: Diagnosis not present

## 2018-01-27 DIAGNOSIS — Z125 Encounter for screening for malignant neoplasm of prostate: Secondary | ICD-10-CM | POA: Diagnosis not present

## 2018-01-27 DIAGNOSIS — Z9181 History of falling: Secondary | ICD-10-CM | POA: Diagnosis not present

## 2018-01-27 DIAGNOSIS — E669 Obesity, unspecified: Secondary | ICD-10-CM | POA: Diagnosis not present

## 2018-02-03 ENCOUNTER — Other Ambulatory Visit: Payer: Self-pay | Admitting: Cardiology

## 2018-02-18 ENCOUNTER — Ambulatory Visit: Payer: PPO | Admitting: Cardiology

## 2018-02-26 ENCOUNTER — Encounter: Payer: Self-pay | Admitting: Cardiology

## 2018-02-26 ENCOUNTER — Ambulatory Visit (INDEPENDENT_AMBULATORY_CARE_PROVIDER_SITE_OTHER): Payer: PPO | Admitting: Cardiology

## 2018-02-26 VITALS — BP 132/70 | HR 75 | Ht 77.0 in | Wt 275.0 lb

## 2018-02-26 DIAGNOSIS — I722 Aneurysm of renal artery: Secondary | ICD-10-CM

## 2018-02-26 DIAGNOSIS — E0840 Diabetes mellitus due to underlying condition with diabetic neuropathy, unspecified: Secondary | ICD-10-CM

## 2018-02-26 DIAGNOSIS — I10 Essential (primary) hypertension: Secondary | ICD-10-CM

## 2018-02-26 DIAGNOSIS — Z7901 Long term (current) use of anticoagulants: Secondary | ICD-10-CM | POA: Diagnosis not present

## 2018-02-26 DIAGNOSIS — I482 Chronic atrial fibrillation, unspecified: Secondary | ICD-10-CM

## 2018-02-26 DIAGNOSIS — E785 Hyperlipidemia, unspecified: Secondary | ICD-10-CM

## 2018-02-26 NOTE — Addendum Note (Signed)
Addended by: Tarri Glenn on: 02/26/2018 09:22 AM   Modules accepted: Orders

## 2018-02-26 NOTE — Progress Notes (Signed)
Cardiology Office Note:    Date:  02/26/2018   ID:  CHRISTION LEONHARD, DOB 12-Sep-1946, MRN [id number]  PCP:  Melony Overly, MD  Cardiologist:  Jenean Lindau, MD   Referring MD: Melony Overly, MD    ASSESSMENT:    1. Chronic atrial fibrillation ([place])   2. 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 Essential hypertension   4. Diabetes mellitus due to underlying condition with diabetic neuropathy, without long-term current use of insulin ([place])   5. Dyslipidemia   6. Chronic anticoagulation    PLAN:    In order of problems listed above:  1. Primary prevention stressed with the patient.  Importance of compliance with diet and medications stressed and he vocalized understanding.  He has done a remarkable job with diet and exercise and lost weight and asked him to continue the trend and do more. 2. Importance of regular exercise stressed exercise protocol outlined. 3. I discussed with the patient atrial fibrillation, disease process. Management and therapy including rate and rhythm control, anticoagulation benefits and potential risks were discussed extensively with the patient. Patient had multiple questions which were answered to patient's satisfaction. 4. I reviewed his blood work and his labs especially lipids are well including his LDL.  Diet was further discussed. 5. Patient will be seen in follow-up appointment in 6 months or earlier if the patient has any concerns    Medication Adjustments/Labs and Tests Ordered: Current medicines are reviewed at length with the patient today.  Concerns regarding medicines are outlined above.  No orders of the defined types were placed in this encounter.  No orders of the defined types were placed in this encounter.    Chief Complaint  Patient presents with  . Follow-up     History of Present Illness:    Ryan Vaughan is a 72 y.o. male.  Patient has past medical history of essential hypertension, diabetes mellitus, dyslipidemia and had  history of morbid obesity.  His chronic atrial fibrillation on anticoagulation.  He has a remarkable job with his activity and his diet and has lost a tremendous amount of weight with his self-determination.  He denies any problems at this time.  No chest pain orthopnea or PND.  At the time of my evaluation, the patient is alert awake oriented and in no distress.  History reviewed. No pertinent past medical history.  History reviewed. No pertinent surgical history.  Current Medications: Current Meds  Medication Sig  . amLODipine (NORVASC) 5 MG tablet Take 5 mg by mouth daily.  Marland Kitchen aspirin EC 81 MG tablet Take 81 mg by mouth daily.  Marland Kitchen atorvastatin (LIPITOR) 80 MG tablet TAKE 1 TABLET BY MOUTH ONCE DAILY  . carvedilol (COREG) 12.5 MG tablet Take 1 tablet (12.5 mg total) 2 (two) times daily by mouth.  Marland Kitchen DIGOX 250 MCG tablet TAKE 1 TABLET BY MOUTH DAILY  . gabapentin (NEURONTIN) 800 MG tablet Take 800 mg by mouth daily as needed.  . hydrALAZINE (APRESOLINE) 10 MG tablet TAKE 1 TABLET BY MOUTH DAILY  . losartan (COZAAR) 100 MG tablet Take 100 mg by mouth daily.   . metFORMIN (GLUCOPHAGE) 1000 MG tablet Take 1,000 mg by mouth 2 (two) times daily with a meal.  . nitroGLYCERIN (NITROSTAT) 0.4 MG SL tablet Place 0.4 mg under the tongue.  . rivaroxaban (XARELTO) 20 MG TABS tablet Take 20 mg by mouth daily.  Marland Kitchen VICTOZA 18 MG/3ML SOPN Inject 1.6 mg into the skin daily.  . [DISCONTINUED]  empagliflozin (JARDIANCE) 25 MG TABS tablet Take 25 mg by mouth daily.     Allergies:   Penicillins   Social History   Socioeconomic History  . Marital status: Married    Spouse name: Not on file  . Number of children: Not on file  . Years of education: Not on file  . Highest education level: Not on file  Occupational History  . Not on file  Social Needs  . Financial resource strain: Not on file  . Food insecurity:    Worry: Not on file    Inability: Not on file  . Transportation needs:    Medical: Not on  file    Non-medical: Not on file  Tobacco Use  . Smoking status: Never Smoker  . Smokeless tobacco: Never Used  Substance and Sexual Activity  . Alcohol use: No  . Drug use: No  . Sexual activity: Not on file  Lifestyle  . Physical activity:    Days per week: Not on file    Minutes per session: Not on file  . Stress: Not on file  Relationships  . Social connections:    Talks on phone: Not on file    Gets together: Not on file    Attends religious service: Not on file    Active member of club or organization: Not on file    Attends meetings of clubs or organizations: Not on file    Relationship status: Not on file  Other Topics Concern  . Not on file  Social History Narrative  . Not on file     Family History: The patient's family history is not on file.  ROS:   Please see the history of present illness.    All other systems reviewed and are negative.  EKGs/Labs/Other Studies Reviewed:    The following studies were reviewed today: EKG revealed atrial fibrillation with nonspecific ST-T changes and well-controlled ventricular rate.   Recent Labs: No results found for requested labs within last 8760 hours.  Recent Lipid Panel No results found for: CHOL, TRIG, HDL, CHOLHDL, VLDL, LDLCALC, LDLDIRECT  Physical Exam:    VS:  BP 132/70 (BP Location: Left Arm, Patient Position: Sitting, Cuff Size: Normal)   Pulse 75   Ht 6\' 5"  (1.956 m)   Wt 275 lb (124.7 kg)   SpO2 98%   BMI 32.61 kg/m     Wt Readings from Last 3 Encounters:  02/26/18 275 lb (124.7 kg)  08/13/17 289 lb 1.9 oz (131.1 kg)  05/14/17 294 lb 0.8 oz (133.4 kg)     GEN: Patient is in no acute distress HEENT: Normal NECK: No JVD; No carotid bruits LYMPHATICS: No lymphadenopathy CARDIAC: Hear sounds regular, 2/6 systolic murmur at the apex. RESPIRATORY:  Clear to auscultation without rales, wheezing or rhonchi  ABDOMEN: Soft, non-tender, non-distended MUSCULOSKELETAL:  No edema; No deformity  SKIN:  Warm and dry NEUROLOGIC:  Alert and oriented x 3 PSYCHIATRIC:  Normal affect   Signed, Jenean Lindau, MD  02/26/2018 8:53 AM    [place]

## 2018-02-26 NOTE — Patient Instructions (Signed)
Medication Instructions:  Your physician recommends that you continue on your current medications as directed. Please refer to the Current Medication list given to you today.  Samples for xarelto given.  Labwork: None  Testing/Procedures: None  Follow-Up: Your physician recommends that you schedule a follow-up appointment in: 6 months  Any Other Special Instructions Will Be Listed Below (If Applicable).     If you need a refill on your cardiac medications before your next appointment, please call your pharmacy.   [place], RN, BSN

## 2018-04-17 DIAGNOSIS — M109 Gout, unspecified: Secondary | ICD-10-CM | POA: Diagnosis not present

## 2018-04-17 DIAGNOSIS — I10 Essential (primary) hypertension: Secondary | ICD-10-CM | POA: Diagnosis not present

## 2018-04-17 DIAGNOSIS — I4891 Unspecified atrial fibrillation: Secondary | ICD-10-CM | POA: Diagnosis not present

## 2018-04-17 DIAGNOSIS — E1129 Type 2 diabetes mellitus with other diabetic kidney complication: Secondary | ICD-10-CM | POA: Diagnosis not present

## 2018-04-17 DIAGNOSIS — Z79899 Other long term (current) drug therapy: Secondary | ICD-10-CM | POA: Diagnosis not present

## 2018-04-17 DIAGNOSIS — Z1339 Encounter for screening examination for other mental health and behavioral disorders: Secondary | ICD-10-CM | POA: Diagnosis not present

## 2018-04-17 DIAGNOSIS — E559 Vitamin D deficiency, unspecified: Secondary | ICD-10-CM | POA: Diagnosis not present

## 2018-04-17 DIAGNOSIS — E785 Hyperlipidemia, unspecified: Secondary | ICD-10-CM | POA: Diagnosis not present

## 2018-04-21 DIAGNOSIS — I6521 Occlusion and stenosis of right carotid artery: Secondary | ICD-10-CM | POA: Diagnosis not present

## 2018-04-21 DIAGNOSIS — Z48812 Encounter for surgical aftercare following surgery on the circulatory system: Secondary | ICD-10-CM | POA: Diagnosis not present

## 2018-05-05 DIAGNOSIS — M25512 Pain in left shoulder: Secondary | ICD-10-CM | POA: Diagnosis not present

## 2018-05-07 DIAGNOSIS — M5412 Radiculopathy, cervical region: Secondary | ICD-10-CM | POA: Diagnosis not present

## 2018-05-12 DIAGNOSIS — H2513 Age-related nuclear cataract, bilateral: Secondary | ICD-10-CM | POA: Diagnosis not present

## 2018-05-12 DIAGNOSIS — E119 Type 2 diabetes mellitus without complications: Secondary | ICD-10-CM | POA: Diagnosis not present

## 2018-05-12 DIAGNOSIS — H40003 Preglaucoma, unspecified, bilateral: Secondary | ICD-10-CM | POA: Diagnosis not present

## 2018-05-14 DIAGNOSIS — M546 Pain in thoracic spine: Secondary | ICD-10-CM | POA: Diagnosis not present

## 2018-05-14 DIAGNOSIS — M542 Cervicalgia: Secondary | ICD-10-CM | POA: Diagnosis not present

## 2018-05-15 DIAGNOSIS — M542 Cervicalgia: Secondary | ICD-10-CM | POA: Diagnosis not present

## 2018-05-15 DIAGNOSIS — M546 Pain in thoracic spine: Secondary | ICD-10-CM | POA: Diagnosis not present

## 2018-05-20 DIAGNOSIS — M546 Pain in thoracic spine: Secondary | ICD-10-CM | POA: Diagnosis not present

## 2018-05-20 DIAGNOSIS — M542 Cervicalgia: Secondary | ICD-10-CM | POA: Diagnosis not present

## 2018-05-22 DIAGNOSIS — M546 Pain in thoracic spine: Secondary | ICD-10-CM | POA: Diagnosis not present

## 2018-05-22 DIAGNOSIS — M542 Cervicalgia: Secondary | ICD-10-CM | POA: Diagnosis not present

## 2018-05-27 DIAGNOSIS — M546 Pain in thoracic spine: Secondary | ICD-10-CM | POA: Diagnosis not present

## 2018-05-27 DIAGNOSIS — M542 Cervicalgia: Secondary | ICD-10-CM | POA: Diagnosis not present

## 2018-06-03 DIAGNOSIS — M546 Pain in thoracic spine: Secondary | ICD-10-CM | POA: Diagnosis not present

## 2018-06-03 DIAGNOSIS — M542 Cervicalgia: Secondary | ICD-10-CM | POA: Diagnosis not present

## 2018-06-05 DIAGNOSIS — M546 Pain in thoracic spine: Secondary | ICD-10-CM | POA: Diagnosis not present

## 2018-06-05 DIAGNOSIS — M542 Cervicalgia: Secondary | ICD-10-CM | POA: Diagnosis not present

## 2018-06-09 DIAGNOSIS — M546 Pain in thoracic spine: Secondary | ICD-10-CM | POA: Diagnosis not present

## 2018-06-09 DIAGNOSIS — M542 Cervicalgia: Secondary | ICD-10-CM | POA: Diagnosis not present

## 2018-06-23 DIAGNOSIS — M542 Cervicalgia: Secondary | ICD-10-CM | POA: Diagnosis not present

## 2018-06-23 DIAGNOSIS — M546 Pain in thoracic spine: Secondary | ICD-10-CM | POA: Diagnosis not present

## 2018-06-26 DIAGNOSIS — M546 Pain in thoracic spine: Secondary | ICD-10-CM | POA: Diagnosis not present

## 2018-06-26 DIAGNOSIS — M542 Cervicalgia: Secondary | ICD-10-CM | POA: Diagnosis not present

## 2018-07-29 DIAGNOSIS — I4891 Unspecified atrial fibrillation: Secondary | ICD-10-CM | POA: Diagnosis not present

## 2018-07-29 DIAGNOSIS — I10 Essential (primary) hypertension: Secondary | ICD-10-CM | POA: Diagnosis not present

## 2018-07-29 DIAGNOSIS — E1129 Type 2 diabetes mellitus with other diabetic kidney complication: Secondary | ICD-10-CM | POA: Diagnosis not present

## 2018-07-29 DIAGNOSIS — M109 Gout, unspecified: Secondary | ICD-10-CM | POA: Diagnosis not present

## 2018-07-29 DIAGNOSIS — E785 Hyperlipidemia, unspecified: Secondary | ICD-10-CM | POA: Diagnosis not present

## 2018-07-29 DIAGNOSIS — Z23 Encounter for immunization: Secondary | ICD-10-CM | POA: Diagnosis not present

## 2018-07-29 DIAGNOSIS — Z79899 Other long term (current) drug therapy: Secondary | ICD-10-CM | POA: Diagnosis not present

## 2018-07-29 DIAGNOSIS — E559 Vitamin D deficiency, unspecified: Secondary | ICD-10-CM | POA: Diagnosis not present

## 2018-08-25 ENCOUNTER — Ambulatory Visit (INDEPENDENT_AMBULATORY_CARE_PROVIDER_SITE_OTHER): Payer: PPO | Admitting: Cardiology

## 2018-08-25 ENCOUNTER — Encounter: Payer: Self-pay | Admitting: Cardiology

## 2018-08-25 VITALS — BP 166/84 | HR 95 | Ht 77.0 in | Wt 290.0 lb

## 2018-08-25 DIAGNOSIS — I722 Aneurysm of renal artery: Secondary | ICD-10-CM | POA: Diagnosis not present

## 2018-08-25 DIAGNOSIS — E119 Type 2 diabetes mellitus without complications: Secondary | ICD-10-CM

## 2018-08-25 DIAGNOSIS — I10 Essential (primary) hypertension: Secondary | ICD-10-CM | POA: Diagnosis not present

## 2018-08-25 DIAGNOSIS — I482 Chronic atrial fibrillation, unspecified: Secondary | ICD-10-CM

## 2018-08-25 DIAGNOSIS — E785 Hyperlipidemia, unspecified: Secondary | ICD-10-CM

## 2018-08-25 DIAGNOSIS — Z7901 Long term (current) use of anticoagulants: Secondary | ICD-10-CM

## 2018-08-25 DIAGNOSIS — E088 Diabetes mellitus due to underlying condition with unspecified complications: Secondary | ICD-10-CM | POA: Insufficient documentation

## 2018-08-25 DIAGNOSIS — Z9889 Other specified postprocedural states: Secondary | ICD-10-CM | POA: Diagnosis not present

## 2018-08-25 HISTORY — DX: Diabetes mellitus due to underlying condition with unspecified complications: E08.8

## 2018-08-25 HISTORY — DX: Type 2 diabetes mellitus without complications: E11.9

## 2018-08-25 NOTE — Progress Notes (Signed)
Cardiology Office Note:    Date:  08/25/2018   ID:  ELIODORO GULLETT, DOB Jan 11, 1946, MRN [id number]  PCP:  Melony Overly, MD  Cardiologist:  Jenean Lindau, MD   Referring MD: Melony Overly, MD    ASSESSMENT:    1. 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 Essential hypertension   3. Chronic atrial fibrillation   4. H/O carotid endarterectomy   5. Dyslipidemia   6. Chronic anticoagulation   7. Diabetes mellitus due to underlying condition with unspecified complications ([place])    PLAN:    In order of problems listed above:  1. Primary prevention stressed with the patient.  Importance of compliance with diet and medication stressed and he vocalized understanding.  His blood pressure is stable.  He will keep a track of it and send [place] the readings and we will adjust his blood pressure medications if necessary. 2. I discussed with the patient atrial fibrillation, disease process. Management and therapy including rate and rhythm control, anticoagulation benefits and potential risks were discussed extensively with the patient. Patient had multiple questions which were answered to patient's satisfaction. 3. He will have blood work today including fasting lipids 4. Patient will be seen in follow-up appointment in 6 months or earlier if the patient has any concerns    Medication Adjustments/Labs and Tests Ordered: Current medicines are reviewed at length with the patient today.  Concerns regarding medicines are outlined above.  No orders of the defined types were placed in this encounter.  No orders of the defined types were placed in this encounter.    No chief complaint on file.    History of Present Illness:    Ryan Vaughan is a 72 y.o. male.  The patient has history of chronic atrial fibrillation, essential hypertension and diabetes mellitus.  He denies any problems at this time and takes care of activities of daily living.  He has been lax with his exercise and diet and has  gained more than 10 pounds since the last visit and he is not happy about it.  No chest pain orthopnea or PND.  At the time of my evaluation, the patient is alert awake oriented and in no distress.  He mentions to me that his blood pressure is elevated as he has had a hectic past day or 2 and he tells me that generally is fine and he is going to keep track of it.  History reviewed. No pertinent past medical history.  History reviewed. No pertinent surgical history.  Current Medications: Current Meds  Medication Sig  . amLODipine (NORVASC) 5 MG tablet Take 5 mg by mouth daily.  Marland Kitchen aspirin EC 81 MG tablet Take 81 mg by mouth daily.  Marland Kitchen atorvastatin (LIPITOR) 80 MG tablet TAKE 1 TABLET BY MOUTH ONCE DAILY  . carvedilol (COREG) 12.5 MG tablet Take 1 tablet (12.5 mg total) 2 (two) times daily by mouth.  Marland Kitchen DIGOX 250 MCG tablet TAKE 1 TABLET BY MOUTH DAILY  . gabapentin (NEURONTIN) 800 MG tablet Take 800 mg by mouth daily as needed.  . hydrALAZINE (APRESOLINE) 10 MG tablet TAKE 1 TABLET BY MOUTH DAILY  . losartan (COZAAR) 100 MG tablet Take 100 mg by mouth daily.   . metFORMIN (GLUCOPHAGE) 1000 MG tablet Take 1,000 mg by mouth 2 (two) times daily with a meal.  . nitroGLYCERIN (NITROSTAT) 0.4 MG SL tablet Place 0.4 mg under the tongue.  . rivaroxaban (XARELTO) 20 MG TABS tablet Take 20 mg  by mouth daily.  Marland Kitchen VICTOZA 18 MG/3ML SOPN Inject 1.6 mg into the skin daily.     Allergies:   Penicillins   Social History   Socioeconomic History  . Marital status: Married    Spouse name: Not on file  . Number of children: Not on file  . Years of education: Not on file  . Highest education level: Not on file  Occupational History  . Not on file  Social Needs  . Financial resource strain: Not on file  . Food insecurity:    Worry: Not on file    Inability: Not on file  . Transportation needs:    Medical: Not on file    Non-medical: Not on file  Tobacco Use  . Smoking status: Never Smoker  .  Smokeless tobacco: Never Used  Substance and Sexual Activity  . Alcohol use: No  . Drug use: No  . Sexual activity: Not on file  Lifestyle  . Physical activity:    Days per week: Not on file    Minutes per session: Not on file  . Stress: Not on file  Relationships  . Social connections:    Talks on phone: Not on file    Gets together: Not on file    Attends religious service: Not on file    Active member of club or organization: Not on file    Attends meetings of clubs or organizations: Not on file    Relationship status: Not on file  Other Topics Concern  . Not on file  Social History Narrative  . Not on file     Family History: The patient's family history is not on file.  ROS:   Please see the history of present illness.    All other systems reviewed and are negative.  EKGs/Labs/Other Studies Reviewed:    The following studies were reviewed today: I discussed my findings with patient at extensive length.   Recent Labs: No results found for requested labs within last 8760 hours.  Recent Lipid Panel No results found for: CHOL, TRIG, HDL, CHOLHDL, VLDL, LDLCALC, LDLDIRECT  Physical Exam:    VS:  BP (!) 166/84 (BP Location: Right Arm, Patient Position: Sitting, Cuff Size: Normal)   Pulse 95   Ht 6\' 5"  (1.956 m)   Wt 290 lb (131.5 kg)   SpO2 99%   BMI 34.39 kg/m     Wt Readings from Last 3 Encounters:  08/25/18 290 lb (131.5 kg)  02/26/18 275 lb (124.7 kg)  08/13/17 289 lb 1.9 oz (131.1 kg)     GEN: Patient is in no acute distress HEENT: Normal NECK: No JVD; No carotid bruits LYMPHATICS: No lymphadenopathy CARDIAC: Hear sounds regular, 2/6 systolic murmur at the apex. RESPIRATORY:  Clear to auscultation without rales, wheezing or rhonchi  ABDOMEN: Soft, non-tender, non-distended MUSCULOSKELETAL:  No edema; No deformity  SKIN: Warm and dry NEUROLOGIC:  Alert and oriented x 3 PSYCHIATRIC:  Normal affect   Signed, Jenean Lindau, MD  08/25/2018  8:32 AM    [facility]

## 2018-08-25 NOTE — Patient Instructions (Signed)
Medication Instructions:  Your physician recommends that you continue on your current medications as directed. Please refer to the Current Medication list given to you today.  If you need a refill on your cardiac medications before your next appointment, please call your pharmacy.   Lab work: Your physician recommends that you have the following labs drawn: CBC, BMP, TSH, liver and lipid panel.  If you have labs (blood work) drawn today and your tests are completely normal, you will receive your results only by: Marland Kitchen MyChart Message (if you have MyChart) OR . A paper copy in the mail If you have any lab test that is abnormal or we need to change your treatment, we will call you to review the results.  Testing/Procedures: None  Follow-Up: At [facility], you and your health needs are our priority.  As part of our continuing mission to provide you with exceptional heart care, we have created designated Provider Care Teams.  These Care Teams include your primary Cardiologist (physician) and Advanced Practice Providers (APPs -  Physician Assistants and Nurse Practitioners) who all work together to provide you with the care you need, when you need it.  You will need a follow up appointment in 6 months.  Please call our office 2 months in advance to schedule this appointment.  You may see another member of our [employer] Provider Team in [place]: Jenne Campus, MD . Shirlee More, MD  Any Other Special Instructions Will Be Listed Below (If Applicable).

## 2018-08-26 LAB — BASIC METABOLIC PANEL
BUN / CREAT RATIO: 12 (ref 10–24)
BUN: 13 mg/dL (ref 8–27)
CO2: 25 mmol/L (ref 20–29)
Calcium: 9.5 mg/dL (ref 8.6–10.2)
Chloride: 97 mmol/L (ref 96–106)
Creatinine, Ser: 1.06 mg/dL (ref 0.76–1.27)
GFR calc Af Amer: 81 mL/min/{1.73_m2} (ref >59)
GFR, EST NON AFRICAN AMERICAN: 70 mL/min/{1.73_m2} (ref >59)
Glucose: 209 mg/dL — ABNORMAL HIGH (ref 65–99)
Potassium: 4.7 mmol/L (ref 3.5–5.2)
Sodium: 137 mmol/L (ref 134–144)

## 2018-08-26 LAB — CBC WITH DIFFERENTIAL/PLATELET
BASOS: 1 %
Basophils Absolute: 0.1 10*3/uL (ref 0.0–0.2)
EOS (ABSOLUTE): 0.3 10*3/uL (ref 0.0–0.4)
EOS: 4 %
HEMATOCRIT: 42.3 % (ref 37.5–51.0)
HEMOGLOBIN: 14.3 g/dL (ref 13.0–17.7)
IMMATURE GRANULOCYTES: 1 %
Immature Grans (Abs): 0.1 10*3/uL (ref 0.0–0.1)
Lymphocytes Absolute: 1.7 10*3/uL (ref 0.7–3.1)
Lymphs: 20 %
MCH: 32.1 pg (ref 26.6–33.0)
MCHC: 33.8 g/dL (ref 31.5–35.7)
MCV: 95 fL (ref 79–97)
MONOCYTES: 7 %
MONOS ABS: 0.6 10*3/uL (ref 0.1–0.9)
NEUTROS PCT: 67 %
Neutrophils Absolute: 5.6 10*3/uL (ref 1.4–7.0)
Platelets: 197 10*3/uL (ref 150–450)
RBC: 4.46 x10E6/uL (ref 4.14–5.80)
RDW: 12.8 % (ref 12.3–15.4)
WBC: 8.4 10*3/uL (ref 3.4–10.8)

## 2018-08-26 LAB — LIPID PANEL
CHOLESTEROL TOTAL: 141 mg/dL (ref 100–199)
Chol/HDL Ratio: 3.4 ratio (ref 0.0–5.0)
HDL: 41 mg/dL (ref >39)
LDL Calculated: 84 mg/dL (ref 0–99)
TRIGLYCERIDES: 80 mg/dL (ref 0–149)
VLDL Cholesterol Cal: 16 mg/dL (ref 5–40)

## 2018-08-26 LAB — HEPATIC FUNCTION PANEL
ALBUMIN: 4.3 g/dL (ref 3.5–4.8)
ALK PHOS: 63 IU/L (ref 39–117)
ALT: 9 IU/L (ref 0–44)
AST: 19 IU/L (ref 0–40)
BILIRUBIN TOTAL: 0.4 mg/dL (ref 0.0–1.2)
BILIRUBIN, DIRECT: 0.11 mg/dL (ref 0.00–0.40)
TOTAL PROTEIN: 7.2 g/dL (ref 6.0–8.5)

## 2018-08-26 LAB — TSH: TSH: 1.6 u[IU]/mL (ref 0.450–4.500)

## 2018-09-03 ENCOUNTER — Telehealth: Payer: Self-pay

## 2018-09-03 NOTE — Telephone Encounter (Signed)
Samples have been given to the patient.

## 2018-09-03 NOTE — Telephone Encounter (Signed)
Patient has run out of xarelto, can we please see if we have any samples in the [place] office for this patient. Patient was informed we would call if samples are available.

## 2018-09-29 ENCOUNTER — Other Ambulatory Visit: Payer: Self-pay | Admitting: Cardiology

## 2018-09-30 DIAGNOSIS — M722 Plantar fascial fibromatosis: Secondary | ICD-10-CM | POA: Diagnosis not present

## 2018-09-30 DIAGNOSIS — M6702 Short Achilles tendon (acquired), left ankle: Secondary | ICD-10-CM | POA: Diagnosis not present

## 2018-10-29 DIAGNOSIS — I10 Essential (primary) hypertension: Secondary | ICD-10-CM | POA: Diagnosis not present

## 2018-10-29 DIAGNOSIS — I4891 Unspecified atrial fibrillation: Secondary | ICD-10-CM | POA: Diagnosis not present

## 2018-10-29 DIAGNOSIS — E1129 Type 2 diabetes mellitus with other diabetic kidney complication: Secondary | ICD-10-CM | POA: Diagnosis not present

## 2018-10-29 DIAGNOSIS — Z79899 Other long term (current) drug therapy: Secondary | ICD-10-CM | POA: Diagnosis not present

## 2018-10-29 DIAGNOSIS — H40053 Ocular hypertension, bilateral: Secondary | ICD-10-CM | POA: Diagnosis not present

## 2018-10-29 DIAGNOSIS — E559 Vitamin D deficiency, unspecified: Secondary | ICD-10-CM | POA: Diagnosis not present

## 2018-10-29 DIAGNOSIS — M109 Gout, unspecified: Secondary | ICD-10-CM | POA: Diagnosis not present

## 2018-10-29 DIAGNOSIS — E785 Hyperlipidemia, unspecified: Secondary | ICD-10-CM | POA: Diagnosis not present

## 2018-11-12 ENCOUNTER — Other Ambulatory Visit: Payer: Self-pay

## 2018-11-12 ENCOUNTER — Telehealth: Payer: Self-pay | Admitting: Cardiology

## 2018-11-12 DIAGNOSIS — I482 Chronic atrial fibrillation, unspecified: Secondary | ICD-10-CM

## 2018-11-12 MED ORDER — RIVAROXABAN 20 MG PO TABS: 20.0000 mg | ORAL_TABLET | Freq: Every day | ORAL | 3 refills | Status: DC

## 2018-11-12 NOTE — Telephone Encounter (Signed)
Patient calling the office for samples of medication:   1.  What medication and dosage are you requesting samples for? Xarelto  2.  Are you currently out of this medication? Yes  Patient would like samples please.

## 2018-11-19 NOTE — Telephone Encounter (Signed)
Did anyone take care of this??

## 2018-11-20 NOTE — Telephone Encounter (Signed)
A refill was sent to his Pharmacy 11/12/2018. I will check for samples and notify pt.

## 2018-11-20 NOTE — Telephone Encounter (Signed)
Samples at front desk ready for pick up. Patient informed.

## 2018-11-26 ENCOUNTER — Other Ambulatory Visit: Payer: Self-pay | Admitting: Cardiology

## 2019-01-28 DIAGNOSIS — E1129 Type 2 diabetes mellitus with other diabetic kidney complication: Secondary | ICD-10-CM | POA: Diagnosis not present

## 2019-01-28 DIAGNOSIS — E559 Vitamin D deficiency, unspecified: Secondary | ICD-10-CM | POA: Diagnosis not present

## 2019-01-28 DIAGNOSIS — M109 Gout, unspecified: Secondary | ICD-10-CM | POA: Diagnosis not present

## 2019-01-28 DIAGNOSIS — E785 Hyperlipidemia, unspecified: Secondary | ICD-10-CM | POA: Diagnosis not present

## 2019-01-28 DIAGNOSIS — I10 Essential (primary) hypertension: Secondary | ICD-10-CM | POA: Diagnosis not present

## 2019-01-28 DIAGNOSIS — Z125 Encounter for screening for malignant neoplasm of prostate: Secondary | ICD-10-CM | POA: Diagnosis not present

## 2019-01-28 DIAGNOSIS — I4891 Unspecified atrial fibrillation: Secondary | ICD-10-CM | POA: Diagnosis not present

## 2019-01-28 DIAGNOSIS — Z139 Encounter for screening, unspecified: Secondary | ICD-10-CM | POA: Diagnosis not present

## 2019-01-28 DIAGNOSIS — Z79899 Other long term (current) drug therapy: Secondary | ICD-10-CM | POA: Diagnosis not present

## 2019-01-29 DIAGNOSIS — E669 Obesity, unspecified: Secondary | ICD-10-CM | POA: Diagnosis not present

## 2019-01-29 DIAGNOSIS — Z0000 Encounter for general adult medical examination without abnormal findings: Secondary | ICD-10-CM | POA: Diagnosis not present

## 2019-01-29 DIAGNOSIS — Z125 Encounter for screening for malignant neoplasm of prostate: Secondary | ICD-10-CM | POA: Diagnosis not present

## 2019-01-29 DIAGNOSIS — E785 Hyperlipidemia, unspecified: Secondary | ICD-10-CM | POA: Diagnosis not present

## 2019-01-29 DIAGNOSIS — Z136 Encounter for screening for cardiovascular disorders: Secondary | ICD-10-CM | POA: Diagnosis not present

## 2019-01-29 DIAGNOSIS — Z139 Encounter for screening, unspecified: Secondary | ICD-10-CM | POA: Diagnosis not present

## 2019-01-29 DIAGNOSIS — Z9181 History of falling: Secondary | ICD-10-CM | POA: Diagnosis not present

## 2019-01-29 DIAGNOSIS — Z6835 Body mass index (BMI) 35.0-35.9, adult: Secondary | ICD-10-CM | POA: Diagnosis not present

## 2019-01-29 DIAGNOSIS — Z1331 Encounter for screening for depression: Secondary | ICD-10-CM | POA: Diagnosis not present

## 2019-02-19 ENCOUNTER — Ambulatory Visit: Payer: PPO | Admitting: Cardiology

## 2019-04-16 ENCOUNTER — Ambulatory Visit: Payer: PPO | Admitting: Cardiology

## 2019-04-23 ENCOUNTER — Encounter: Payer: Self-pay | Admitting: Cardiology

## 2019-04-23 ENCOUNTER — Other Ambulatory Visit: Payer: Self-pay

## 2019-04-23 ENCOUNTER — Ambulatory Visit (INDEPENDENT_AMBULATORY_CARE_PROVIDER_SITE_OTHER): Payer: PPO | Admitting: Cardiology

## 2019-04-23 VITALS — BP 118/62 | HR 62 | Ht 77.0 in | Wt 294.0 lb

## 2019-04-23 DIAGNOSIS — I722 Aneurysm of renal artery: Secondary | ICD-10-CM | POA: Diagnosis not present

## 2019-04-23 DIAGNOSIS — Z9889 Other specified postprocedural states: Secondary | ICD-10-CM | POA: Diagnosis not present

## 2019-04-23 DIAGNOSIS — Z7901 Long term (current) use of anticoagulants: Secondary | ICD-10-CM

## 2019-04-23 DIAGNOSIS — I482 Chronic atrial fibrillation, unspecified: Secondary | ICD-10-CM

## 2019-04-23 DIAGNOSIS — E088 Diabetes mellitus due to underlying condition with unspecified complications: Secondary | ICD-10-CM

## 2019-04-23 DIAGNOSIS — I10 Essential (primary) hypertension: Secondary | ICD-10-CM | POA: Diagnosis not present

## 2019-04-23 DIAGNOSIS — E785 Hyperlipidemia, unspecified: Secondary | ICD-10-CM

## 2019-04-23 NOTE — Progress Notes (Signed)
Cardiology Office Note:    Date:  04/23/2019   ID:  Ryan Vaughan, DOB Nov 27, 1945, MRN [id number]  PCP:  Nicoletta Dress, MD  Cardiologist:  Jenean Lindau, MD   Referring MD: Melony Overly, MD    ASSESSMENT:    1. Chronic atrial fibrillation   2. Essential hypertension   3. 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 Diabetes mellitus due to underlying condition with unspecified complications ([place])   5. H/O carotid endarterectomy   6. Dyslipidemia   7. Chronic anticoagulation    PLAN:    In order of problems listed above:  1. Atrial fibrillation, permanent:I discussed with the patient atrial fibrillation, disease process. Management and therapy including rate and rhythm control, anticoagulation benefits and potential risks were discussed extensively with the patient. Patient had multiple questions which were answered to patient's satisfaction. 2. Essential hypertension: Blood pressure stable 3. Mixed dyslipidemia: Lipids were recently checked by primary care and we will get a copy of the records 4. Patient will be seen in follow-up appointment in 6 months or earlier if the patient has any concerns    Medication Adjustments/Labs and Tests Ordered: Current medicines are reviewed at length with the patient today.  Concerns regarding medicines are outlined above.  No orders of the defined types were placed in this encounter.  No orders of the defined types were placed in this encounter.    No chief complaint on file.    History of Present Illness:    Ryan Vaughan is a 73 y.o. male with past medical history of chronic atrial fibrillation which is permanent, essential hypertension and dyslipidemia.  He denies any problems at this time and takes care of activities of daily living.  No chest pain orthopnea or PND.  At the time of my evaluation, the patient is alert awake oriented and in no distress.  History reviewed. No pertinent past medical history.  History reviewed.  No pertinent surgical history.  Current Medications: Current Meds  Medication Sig  . amLODipine (NORVASC) 5 MG tablet Take 5 mg by mouth daily.  Marland Kitchen aspirin EC 81 MG tablet Take 81 mg by mouth daily.  Marland Kitchen atorvastatin (LIPITOR) 80 MG tablet TAKE 1 TABLET BY MOUTH ONCE DAILY  . carvedilol (COREG) 12.5 MG tablet TAKE ONE (1) TABLET BY MOUTH TWO (2) TIMES DAILY  . DIGOX 250 MCG tablet TAKE ONE TABLET BY MOUTH ONCE DAILY  . gabapentin (NEURONTIN) 800 MG tablet Take 800 mg by mouth daily as needed.  . hydrALAZINE (APRESOLINE) 10 MG tablet TAKE 1 TABLET BY MOUTH DAILY  . losartan (COZAAR) 100 MG tablet Take 100 mg by mouth daily.   . metFORMIN (GLUCOPHAGE) 1000 MG tablet Take 1,000 mg by mouth 2 (two) times daily with a meal.  . nitroGLYCERIN (NITROSTAT) 0.4 MG SL tablet Place 0.4 mg under the tongue.  . rivaroxaban (XARELTO) 20 MG TABS tablet Take 1 tablet (20 mg total) by mouth daily.  Marland Kitchen VICTOZA 18 MG/3ML SOPN Inject 1.6 mg into the skin daily.     Allergies:   Penicillins   Social History   Socioeconomic History  . Marital status: Married    Spouse name: Not on file  . Number of children: Not on file  . Years of education: Not on file  . Highest education level: Not on file  Occupational History  . Not on file  Social Needs  . Financial resource strain: Not on file  . Food insecurity  Worry: Not on file    Inability: Not on file  . Transportation needs    Medical: Not on file    Non-medical: Not on file  Tobacco Use  . Smoking status: Never Smoker  . Smokeless tobacco: Never Used  Substance and Sexual Activity  . Alcohol use: No  . Drug use: No  . Sexual activity: Not on file  Lifestyle  . Physical activity    Days per week: Not on file    Minutes per session: Not on file  . Stress: Not on file  Relationships  . Social Herbalist on phone: Not on file    Gets together: Not on file    Attends religious service: Not on file    Active member of club or  organization: Not on file    Attends meetings of clubs or organizations: Not on file    Relationship status: Not on file  Other Topics Concern  . Not on file  Social History Narrative  . Not on file     Family History: The patient's family history is not on file.  ROS:   Please see the history of present illness.    All other systems reviewed and are negative.  EKGs/Labs/Other Studies Reviewed:    The following studies were reviewed today: Lab work from last evaluation was discussed with the patient.  EKG reveals atrial fibrillation and well-controlled ventricular rate   Recent Labs: 08/25/2018: ALT 9; BUN 13; Creatinine, Ser 1.06; Hemoglobin 14.3; Platelets 197; Potassium 4.7; Sodium 137; TSH 1.600  Recent Lipid Panel    Component Value Date/Time   CHOL 141 08/25/2018 0839   TRIG 80 08/25/2018 0839   HDL 41 08/25/2018 0839   CHOLHDL 3.4 08/25/2018 0839   LDLCALC 84 08/25/2018 0839    Physical Exam:    VS:  BP 118/62 (BP Location: Left Arm, Patient Position: Sitting, Cuff Size: Normal)   Pulse 62   Ht 6\' 5"  (1.956 m)   Wt 294 lb (133.4 kg)   SpO2 98%   BMI 34.86 kg/m     Wt Readings from Last 3 Encounters:  04/23/19 294 lb (133.4 kg)  08/25/18 290 lb (131.5 kg)  02/26/18 275 lb (124.7 kg)     GEN: Patient is in no acute distress HEENT: Normal NECK: No JVD; No carotid bruits LYMPHATICS: No lymphadenopathy CARDIAC: Hear sounds irregular, 2/6 systolic murmur at the apex. RESPIRATORY:  Clear to auscultation without rales, wheezing or rhonchi  ABDOMEN: Soft, non-tender, non-distended MUSCULOSKELETAL:  No edema; No deformity  SKIN: Warm and dry NEUROLOGIC:  Alert and oriented x 3 PSYCHIATRIC:  Normal affect   Signed, Jenean Lindau, MD  04/23/2019 2:29 PM    [place]

## 2019-04-23 NOTE — Addendum Note (Signed)
Addended by: Anselm Pancoast on: 04/23/2019 02:48 PM   Modules accepted: Orders

## 2019-04-23 NOTE — Patient Instructions (Addendum)
Medication Instructions:  Your physician recommends that you continue on your current medications as directed. Please refer to the Current Medication list given to you today.  If you need a refill on your cardiac medications before your next appointment, please call your pharmacy.   Lab work: NONE If you have labs (blood work) drawn today and your tests are completely normal, you will receive your results only by: Marland Kitchen MyChart Message (if you have MyChart) OR . A paper copy in the mail If you have any lab test that is abnormal or we need to change your treatment, we will call you to review the results.  Testing/Procedures: YOU had an EKG performed today.  Follow-Up: At [facility], you and your health needs are our priority.  As part of our continuing mission to provide you with exceptional heart care, we have created designated Provider Care Teams.  These Care Teams include your primary Cardiologist (physician) and Advanced Practice Providers (APPs -  Physician Assistants and Nurse Practitioners) who all work together to provide you with the care you need, when you need it. You will need a follow up appointment in 6 months.

## 2019-04-29 DIAGNOSIS — H524 Presbyopia: Secondary | ICD-10-CM | POA: Diagnosis not present

## 2019-04-29 DIAGNOSIS — H40053 Ocular hypertension, bilateral: Secondary | ICD-10-CM | POA: Diagnosis not present

## 2019-05-04 DIAGNOSIS — E1129 Type 2 diabetes mellitus with other diabetic kidney complication: Secondary | ICD-10-CM | POA: Diagnosis not present

## 2019-05-04 DIAGNOSIS — I10 Essential (primary) hypertension: Secondary | ICD-10-CM | POA: Diagnosis not present

## 2019-05-04 DIAGNOSIS — Z6836 Body mass index (BMI) 36.0-36.9, adult: Secondary | ICD-10-CM | POA: Diagnosis not present

## 2019-05-04 DIAGNOSIS — M109 Gout, unspecified: Secondary | ICD-10-CM | POA: Diagnosis not present

## 2019-05-04 DIAGNOSIS — E785 Hyperlipidemia, unspecified: Secondary | ICD-10-CM | POA: Diagnosis not present

## 2019-05-04 DIAGNOSIS — Z79899 Other long term (current) drug therapy: Secondary | ICD-10-CM | POA: Diagnosis not present

## 2019-05-04 DIAGNOSIS — I4891 Unspecified atrial fibrillation: Secondary | ICD-10-CM | POA: Diagnosis not present

## 2019-05-04 DIAGNOSIS — E1165 Type 2 diabetes mellitus with hyperglycemia: Secondary | ICD-10-CM | POA: Diagnosis not present

## 2019-05-04 DIAGNOSIS — E559 Vitamin D deficiency, unspecified: Secondary | ICD-10-CM | POA: Diagnosis not present

## 2019-05-14 ENCOUNTER — Other Ambulatory Visit: Payer: Self-pay | Admitting: Cardiology

## 2019-06-04 DIAGNOSIS — M722 Plantar fascial fibromatosis: Secondary | ICD-10-CM | POA: Diagnosis not present

## 2019-06-04 DIAGNOSIS — M6702 Short Achilles tendon (acquired), left ankle: Secondary | ICD-10-CM | POA: Diagnosis not present

## 2019-06-05 DIAGNOSIS — M6702 Short Achilles tendon (acquired), left ankle: Secondary | ICD-10-CM | POA: Diagnosis not present

## 2019-06-05 DIAGNOSIS — M722 Plantar fascial fibromatosis: Secondary | ICD-10-CM | POA: Diagnosis not present

## 2019-07-02 ENCOUNTER — Ambulatory Visit: Payer: PPO | Admitting: Cardiology

## 2019-07-15 ENCOUNTER — Other Ambulatory Visit: Payer: Self-pay

## 2019-07-15 ENCOUNTER — Ambulatory Visit (INDEPENDENT_AMBULATORY_CARE_PROVIDER_SITE_OTHER): Payer: PPO

## 2019-07-15 ENCOUNTER — Ambulatory Visit: Payer: PPO | Admitting: Cardiology

## 2019-07-15 ENCOUNTER — Ambulatory Visit (INDEPENDENT_AMBULATORY_CARE_PROVIDER_SITE_OTHER): Payer: PPO | Admitting: Cardiology

## 2019-07-15 ENCOUNTER — Encounter: Payer: Self-pay | Admitting: Cardiology

## 2019-07-15 VITALS — BP 136/86 | HR 76 | Ht 76.0 in | Wt 293.0 lb

## 2019-07-15 DIAGNOSIS — Z9889 Other specified postprocedural states: Secondary | ICD-10-CM | POA: Diagnosis not present

## 2019-07-15 DIAGNOSIS — I10 Essential (primary) hypertension: Secondary | ICD-10-CM | POA: Diagnosis not present

## 2019-07-15 DIAGNOSIS — Z23 Encounter for immunization: Secondary | ICD-10-CM | POA: Diagnosis not present

## 2019-07-15 DIAGNOSIS — I722 Aneurysm of renal artery: Secondary | ICD-10-CM | POA: Diagnosis not present

## 2019-07-15 DIAGNOSIS — I482 Chronic atrial fibrillation, unspecified: Secondary | ICD-10-CM

## 2019-07-15 DIAGNOSIS — E088 Diabetes mellitus due to underlying condition with unspecified complications: Secondary | ICD-10-CM

## 2019-07-15 DIAGNOSIS — R0600 Dyspnea, unspecified: Secondary | ICD-10-CM | POA: Diagnosis not present

## 2019-07-15 DIAGNOSIS — Z7901 Long term (current) use of anticoagulants: Secondary | ICD-10-CM

## 2019-07-15 DIAGNOSIS — R0609 Other forms of dyspnea: Secondary | ICD-10-CM

## 2019-07-15 DIAGNOSIS — E785 Hyperlipidemia, unspecified: Secondary | ICD-10-CM

## 2019-07-15 NOTE — Progress Notes (Signed)
Cardiology Office Note:    Date:  07/15/2019   ID:  Ryan Vaughan, DOB December 07, 1945, MRN [id number]  PCP:  Nicoletta Dress, MD  Cardiologist:  Jenean Lindau, MD   Referring MD: Nicoletta Dress, MD    ASSESSMENT:    1. Dyspnea on exertion   2. Chronic atrial fibrillation (H[facility])   3. Essential hypertension   4. 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 Diabetes mellitus due to underlying condition with unspecified complications ([place])   6. H/O carotid endarterectomy   7. Dyslipidemia   8. Chronic anticoagulation    PLAN:    In order of problems listed above:  1. Dyspnea on exertion: This could be multifactorial.  We will get copy of blood work from primary care physician's office.  We will get a 3-day ZIO monitoring to make sure his heart rate is well controlled.  He has a baseline atrial fibrillation.  Also this could be an anginal equivalent in a patient with known coronary artery disease.  So we will do a Lexiscan sestamibi with 2 per protocol.  Echocardiogram will be done to assess murmur heard on auscultation. 2. Essential hypertension: Blood pressure stable 3. Diet was discussed for dyslipidemia and diabetes mellitus and weight reduction was stressed. 4. He will be seen in follow-up appointment in a month or earlier if he has any concerns.  He had multiple questions which were answered to his satisfaction.   Medication Adjustments/Labs and Tests Ordered: Current medicines are reviewed at length with the patient today.  Concerns regarding medicines are outlined above.  Orders Placed This Encounter  Procedures  . MYOCARDIAL PERFUSION IMAGING  . LONG TERM MONITOR (3-14 DAYS)  . ECHOCARDIOGRAM COMPLETE   No orders of the defined types were placed in this encounter.    No chief complaint on file.    History of Present Illness:    BRODUS Vaughan is a 73 y.o. male.  Patient has past medical history of coronary artery disease, essential hypertension, dyslipidemia and  diabetes mellitus.  He is morbidly obese but he has lost weight.  He has permanent atrial fibrillation.  He denies any problems at this time except that recently has been noticing shortness of breath on exertion.  No chest pain orthopnea or PND.  At the time of my evaluation, the patient is alert awake oriented and in no distress.  He is very concerned about it.  He mentions to me that his wife tells him that he is getting short of breath on exertion and activity around the house.  History reviewed. No pertinent past medical history.  History reviewed. No pertinent surgical history.  Current Medications: Current Meds  Medication Sig  . amLODipine (NORVASC) 5 MG tablet Take 5 mg by mouth daily.  Marland Kitchen aspirin EC 81 MG tablet Take 81 mg by mouth daily.  Marland Kitchen atorvastatin (LIPITOR) 80 MG tablet TAKE 1 TABLET BY MOUTH ONCE DAILY  . carvedilol (COREG) 12.5 MG tablet TAKE ONE (1) TABLET BY MOUTH TWO (2) TIMES DAILY  . DIGOX 250 MCG tablet TAKE ONE TABLET BY MOUTH ONCE DAILY  . gabapentin (NEURONTIN) 800 MG tablet Take 800 mg by mouth daily as needed.  . hydrALAZINE (APRESOLINE) 10 MG tablet TAKE 1 TABLET BY MOUTH DAILY  . metFORMIN (GLUCOPHAGE) 1000 MG tablet Take 1,000 mg by mouth 2 (two) times daily with a meal.  . nitroGLYCERIN (NITROSTAT) 0.4 MG SL tablet Place 0.4 mg under the tongue.  . rivaroxaban (XARELTO) 20 MG  TABS tablet Take 1 tablet (20 mg total) by mouth daily.  . Vitamin D, Ergocalciferol, (DRISDOL) 1.25 MG (50000 UT) CAPS capsule Take 50,000 Units by mouth every 7 (seven) days.     Allergies:   Penicillins   Social History   Socioeconomic History  . Marital status: Married    Spouse name: Not on file  . Number of children: Not on file  . Years of education: Not on file  . Highest education level: Not on file  Occupational History  . Not on file  Social Needs  . Financial resource strain: Not on file  . Food insecurity    Worry: Not on file    Inability: Not on file  .  Transportation needs    Medical: Not on file    Non-medical: Not on file  Tobacco Use  . Smoking status: Never Smoker  . Smokeless tobacco: Never Used  Substance and Sexual Activity  . Alcohol use: No  . Drug use: No  . Sexual activity: Not on file  Lifestyle  . Physical activity    Days per week: Not on file    Minutes per session: Not on file  . Stress: Not on file  Relationships  . Social Herbalist on phone: Not on file    Gets together: Not on file    Attends religious service: Not on file    Active member of club or organization: Not on file    Attends meetings of clubs or organizations: Not on file    Relationship status: Not on file  Other Topics Concern  . Not on file  Social History Narrative  . Not on file     Family History: The patient's family history is not on file.  ROS:   Please see the history of present illness.    All other systems reviewed and are negative.  EKGs/Labs/Other Studies Reviewed:    The following studies were reviewed today: I discussed my findings with the patient at length.  We will obtain lab work from primary care doctor's office   Recent Labs: 08/25/2018: ALT 9; BUN 13; Creatinine, Ser 1.06; Hemoglobin 14.3; Platelets 197; Potassium 4.7; Sodium 137; TSH 1.600  Recent Lipid Panel    Component Value Date/Time   CHOL 141 08/25/2018 0839   TRIG 80 08/25/2018 0839   HDL 41 08/25/2018 0839   CHOLHDL 3.4 08/25/2018 0839   LDLCALC 84 08/25/2018 0839    Physical Exam:    VS:  BP 136/86 (BP Location: Left Arm, Patient Position: Sitting, Cuff Size: Large)   Pulse 76   Ht 6\' 4"  (1.93 m)   Wt 293 lb (132.9 kg)   SpO2 99%   BMI 35.67 kg/m     Wt Readings from Last 3 Encounters:  07/15/19 293 lb (132.9 kg)  04/23/19 294 lb (133.4 kg)  08/25/18 290 lb (131.5 kg)     GEN: Patient is in no acute distress HEENT: Normal NECK: No JVD; No carotid bruits LYMPHATICS: No lymphadenopathy CARDIAC: Hear sounds regular, 2/6  systolic murmur at the apex. RESPIRATORY:  Clear to auscultation without rales, wheezing or rhonchi  ABDOMEN: Soft, non-tender, non-distended MUSCULOSKELETAL:  No edema; No deformity  SKIN: Warm and dry NEUROLOGIC:  Alert and oriented x 3 PSYCHIATRIC:  Normal affect   Signed, Jenean Lindau, MD  07/15/2019 11:58 AM    [place]

## 2019-07-15 NOTE — Patient Instructions (Signed)
Medication Instructions:  Your physician recommends that you continue on your current medications as directed. Please refer to the Current Medication list given to you today.  If you need a refill on your cardiac medications before your next appointment, please call your pharmacy.   Lab work: NONE If you have labs (blood work) drawn today and your tests are completely normal, you will receive your results only by: Marland Kitchen MyChart Message (if you have MyChart) OR . A paper copy in the mail If you have any lab test that is abnormal or we need to change your treatment, we will call you to review the results.  Testing/Procedures: Your physician has requested that you have an echocardiogram. Echocardiography is a painless test that uses sound waves to create images of your heart. It provides your doctor with information about the size and shape of your heart and how well your heart's chambers and valves are working. This procedure takes approximately one hour. There are no restrictions for this procedure.  Your physician has requested that you have a lexiscan myoview. For further information please visit [email or web address]. Please follow instruction sheet, as given.  Your physician has recommended that you wear a ZIO monitor. ZIO monitors are medical devices that record the heart's electrical activity. Doctors most often use these monitors to diagnose arrhythmias. Arrhythmias are problems with the speed or rhythm of the heartbeat. The monitor is a small, portable device. You can wear one while you do your normal daily activities. This is usually used to diagnose what is causing palpitations/syncope (passing out).You will wear this device for 3 days.    Follow-Up: At [facility], you and your health needs are our priority.  As part of our continuing mission to provide you with exceptional heart care, we have created designated Provider Care Teams.  These Care Teams include your primary Cardiologist  (physician) and Advanced Practice Providers (APPs -  Physician Assistants and Nurse Practitioners) who all work together to provide you with the care you need, when you need it. You will need a follow up appointment in 1 months.    Any Other Special Instructions Will Be Listed Below  Regadenoson injection What is this medicine? REGADENOSON is used to test the heart for coronary artery disease. It is used in patients who can not exercise for their stress test. This medicine may be used for other purposes; ask your health care provider or pharmacist if you have questions. COMMON BRAND NAME(S): Lexiscan What should I tell my health care provider before I take this medicine? They need to know if you have any of these conditions:  heart problems  lung or breathing disease, like asthma or COPD  an unusual or allergic reaction to regadenoson, other medicines, foods, dyes, or preservatives  pregnant or trying to get pregnant  breast-feeding How should I use this medicine? This medicine is for injection into a vein. It is given by a health care professional in a hospital or clinic setting. Talk to your pediatrician regarding the use of this medicine in children. Special care may be needed. Overdosage: If you think you have taken too much of this medicine contact a poison control center or emergency room at once. NOTE: This medicine is only for you. Do not share this medicine with others. What if I miss a dose? This does not apply. What may interact with this medicine?  caffeine  dipyridamole  guarana  theophylline This list may not describe all possible interactions. Give your health care provider  a list of all the medicines, herbs, non-prescription drugs, or dietary supplements you use. Also tell them if you smoke, drink alcohol, or use illegal drugs. Some items may interact with your medicine. What should I watch for while using this medicine? Your condition will be monitored  carefully while you are receiving this medicine. Do not take medicines, foods, or drinks with caffeine (like coffee, tea, or colas) for at least 12 hours before your test. If you do not know if something contains caffeine, ask your health care professional. What side effects may I notice from receiving this medicine? Side effects that you should report to your doctor or health care professional as soon as possible:  allergic reactions like skin rash, itching or hives, swelling of the face, lips, or tongue  breathing problems  chest pain, tightness or palpitations  severe headache Side effects that usually do not require medical attention (report to your doctor or health care professional if they continue or are bothersome):  flushing  headache  irritation or pain at site where injected  nausea, vomiting This list may not describe all possible side effects. Call your doctor for medical advice about side effects. You may report side effects to FDA at 1-800-FDA-1088. Where should I keep my medicine? This drug is given in a hospital or clinic and will not be stored at home. NOTE: This sheet is a summary. It may not cover all possible information. If you have questions about this medicine, talk to your doctor, pharmacist, or health care provider.  2020 Elsevier/Gold Standard (2008-05-17 15:08:13)    Cardiac Nuclear Scan A cardiac nuclear scan is a test that is done to check the flow of blood to your heart. It is done when you are resting and when you are exercising. The test looks for problems such as:  Not enough blood reaching a portion of the heart.  The heart muscle not working as it should. You may need this test if:  You have heart disease.  You have had lab results that are not normal.  You have had heart surgery or a balloon procedure to open up blocked arteries (angioplasty).  You have chest pain.  You have shortness of breath. In this test, a special dye (tracer)  is put into your bloodstream. The tracer will travel to your heart. A camera will then take pictures of your heart to see how the tracer moves through your heart. This test is usually done at a hospital and takes 2-4 hours. Tell a doctor about:  Any allergies you have.  All medicines you are taking, including vitamins, herbs, eye drops, creams, and over-the-counter medicines.  Any problems you or family members have had with anesthetic medicines.  Any blood disorders you have.  Any surgeries you have had.  Any medical conditions you have.  Whether you are pregnant or may be pregnant. What are the risks? Generally, this is a safe test. However, problems may occur, such as:  Serious chest pain and heart attack. This is only a risk if the stress portion of the test is done.  Rapid heartbeat.  A feeling of warmth in your chest. This feeling usually does not last long.  Allergic reaction to the tracer. What happens before the test?  Ask your doctor about changing or stopping your normal medicines. This is important.  Follow instructions from your doctor about what you cannot eat or drink.  Remove your jewelry on the day of the test. What happens during the test?  An IV tube will be inserted into one of your veins.  Your doctor will give you a small amount of tracer through the IV tube.  You will wait for 20-40 minutes while the tracer moves through your bloodstream.  Your heart will be monitored with an electrocardiogram (ECG).  You will lie down on an exam table.  Pictures of your heart will be taken for about 15-20 minutes.  You may also have a stress test. For this test, one of these things may be done: ? You will be asked to exercise on a treadmill or a stationary bike. ? You will be given medicines that will make your heart work harder. This is done if you are unable to exercise.  When blood flow to your heart has peaked, a tracer will again be given through the IV  tube.  After 20-40 minutes, you will get back on the exam table. More pictures will be taken of your heart.  Depending on the tracer that is used, more pictures may need to be taken 3-4 hours later.  Your IV tube will be removed when the test is over. The test may vary among doctors and hospitals. What happens after the test?  Ask your doctor: ? Whether you can return to your normal schedule, including diet, activities, and medicines. ? Whether you should drink more fluids. This will help to remove the tracer from your body. Drink enough fluid to keep your pee (urine) pale yellow.  Ask your doctor, or the department that is doing the test: ? When will my results be ready? ? How will I get my results? Summary  A cardiac nuclear scan is a test that is done to check the flow of blood to your heart.  Tell your doctor whether you are pregnant or may be pregnant.  Before the test, ask your doctor about changing or stopping your normal medicines. This is important.  Ask your doctor whether you can return to your normal activities. You may be asked to drink more fluids. This information is not intended to replace advice given to you by your health care provider. Make sure you discuss any questions you have with your health care provider. Document Released: 03/03/2018 Document Revised: 01/07/2019 Document Reviewed: 03/03/2018 Elsevier Patient Education  [place].  Echocardiogram An echocardiogram is a procedure that uses painless sound waves (ultrasound) to produce an image of the heart. Images from an echocardiogram can provide important information about:  Signs of coronary artery disease (CAD).  Aneurysm detection. An aneurysm is a weak or damaged part of an artery wall that bulges out from the normal force of blood pumping through the body.  Heart size and shape. Changes in the size or shape of the heart can be associated with certain conditions, including heart failure,  aneurysm, and CAD.  Heart muscle function.  Heart valve function.  Signs of a past heart attack.  Fluid buildup around the heart.  Thickening of the heart muscle.  A tumor or infectious growth around the heart valves. Tell a health care provider about:  Any allergies you have.  All medicines you are taking, including vitamins, herbs, eye drops, creams, and over-the-counter medicines.  Any blood disorders you have.  Any surgeries you have had.  Any medical conditions you have.  Whether you are pregnant or may be pregnant. What are the risks? Generally, this is a safe procedure. However, problems may occur, including:  Allergic reaction to dye (contrast) that may be used  during the procedure. What happens before the procedure? No specific preparation is needed. You may eat and drink normally. What happens during the procedure?   An IV tube may be inserted into one of your veins.  You may receive contrast through this tube. A contrast is an injection that improves the quality of the pictures from your heart.  A gel will be applied to your chest.  A wand-like tool (transducer) will be moved over your chest. The gel will help to transmit the sound waves from the transducer.  The sound waves will harmlessly bounce off of your heart to allow the heart images to be captured in real-time motion. The images will be recorded on a computer. The procedure may vary among health care providers and hospitals. What happens after the procedure?  You may return to your normal, everyday life, including diet, activities, and medicines, unless your health care provider tells you not to do that. Summary  An echocardiogram is a procedure that uses painless sound waves (ultrasound) to produce an image of the heart.  Images from an echocardiogram can provide important information about the size and shape of your heart, heart muscle function, heart valve function, and fluid buildup around  your heart.  You do not need to do anything to prepare before this procedure. You may eat and drink normally.  After the echocardiogram is completed, you may return to your normal, everyday life, unless your health care provider tells you not to do that. This information is not intended to replace advice given to you by your health care provider. Make sure you discuss any questions you have with your health care provider. Document Released: 09/14/2000 Document Revised: 01/08/2019 Document Reviewed: 10/20/2016 Elsevier Patient Education  2020 [employer].

## 2019-07-28 ENCOUNTER — Telehealth: Payer: Self-pay

## 2019-07-28 ENCOUNTER — Telehealth (HOSPITAL_COMMUNITY): Payer: Self-pay | Admitting: *Deleted

## 2019-07-28 DIAGNOSIS — I482 Chronic atrial fibrillation, unspecified: Secondary | ICD-10-CM

## 2019-07-28 DIAGNOSIS — R0600 Dyspnea, unspecified: Secondary | ICD-10-CM | POA: Diagnosis not present

## 2019-07-28 NOTE — Telephone Encounter (Signed)
Urgent irhythm results relayed to patient per Dr. Docia Furl, patient instructed have labs drawn on day of lexi

## 2019-07-28 NOTE — Telephone Encounter (Signed)
Left message on voicemail per DPR in reference to upcoming appointment scheduled on 08/04/2019 at 1130 with detailed instructions given per Myocardial Perfusion Study Information Sheet for the test. LM to arrive 15 minutes early, and that it is imperative to arrive on time for appointment to keep from having the test rescheduled. If you need to cancel or reschedule your appointment, please call the office within 24 hours of your appointment. Failure to do so may result in a cancellation of your appointment, and a $50 no show fee. Phone number given for call back for any questions.   No my chart avaiable.Holly Pring, Ranae Palms

## 2019-08-04 ENCOUNTER — Other Ambulatory Visit: Payer: Self-pay

## 2019-08-04 ENCOUNTER — Ambulatory Visit (INDEPENDENT_AMBULATORY_CARE_PROVIDER_SITE_OTHER): Payer: PPO

## 2019-08-04 DIAGNOSIS — I482 Chronic atrial fibrillation, unspecified: Secondary | ICD-10-CM

## 2019-08-04 DIAGNOSIS — R0600 Dyspnea, unspecified: Secondary | ICD-10-CM | POA: Diagnosis not present

## 2019-08-04 MED ORDER — TECHNETIUM TC 99M TETROFOSMIN IV KIT
33.0000 | PACK | Freq: Once | INTRAVENOUS | Status: AC | PRN
  Administered 2019-08-04: 33 via INTRAVENOUS

## 2019-08-04 MED ORDER — REGADENOSON 0.4 MG/5ML IV SOLN
0.4000 mg | Freq: Once | INTRAVENOUS | Status: AC
  Administered 2019-08-04: 0.4 mg via INTRAVENOUS

## 2019-08-05 ENCOUNTER — Ambulatory Visit: Payer: PPO

## 2019-08-05 DIAGNOSIS — I482 Chronic atrial fibrillation, unspecified: Secondary | ICD-10-CM | POA: Diagnosis not present

## 2019-08-05 LAB — MYOCARDIAL PERFUSION IMAGING
LV dias vol: 203 mL (ref 62–150)
LV sys vol: 111 mL
Peak HR: 89 {beats}/min
Rest HR: 78 {beats}/min
SDS: 6
SRS: 2
SSS: 8
TID: 1.08

## 2019-08-05 MED ORDER — TECHNETIUM TC 99M TETROFOSMIN IV KIT
31.2000 | PACK | Freq: Once | INTRAVENOUS | Status: AC | PRN
  Administered 2019-08-05: 31.2 via INTRAVENOUS

## 2019-08-06 ENCOUNTER — Telehealth: Payer: Self-pay | Admitting: Cardiology

## 2019-08-06 LAB — BASIC METABOLIC PANEL
BUN/Creatinine Ratio: 16 (ref 10–24)
BUN: 13 mg/dL (ref 8–27)
CO2: 22 mmol/L (ref 20–29)
Calcium: 9.6 mg/dL (ref 8.6–10.2)
Chloride: 101 mmol/L (ref 96–106)
Creatinine, Ser: 0.82 mg/dL (ref 0.76–1.27)
GFR calc Af Amer: 102 mL/min/{1.73_m2} (ref >59)
GFR calc non Af Amer: 88 mL/min/{1.73_m2} (ref >59)
Glucose: 251 mg/dL — ABNORMAL HIGH (ref 65–99)
Potassium: 4.8 mmol/L (ref 3.5–5.2)
Sodium: 138 mmol/L (ref 134–144)

## 2019-08-06 LAB — MAGNESIUM: Magnesium: 1.9 mg/dL (ref 1.6–2.3)

## 2019-08-06 NOTE — Telephone Encounter (Signed)
Please call him with Lexiscan results f om yesterday.. he has yard work that needs to be done.Marland Kitchen

## 2019-08-07 DIAGNOSIS — E538 Deficiency of other specified B group vitamins: Secondary | ICD-10-CM | POA: Diagnosis not present

## 2019-08-07 DIAGNOSIS — I4891 Unspecified atrial fibrillation: Secondary | ICD-10-CM | POA: Diagnosis not present

## 2019-08-07 DIAGNOSIS — E785 Hyperlipidemia, unspecified: Secondary | ICD-10-CM | POA: Diagnosis not present

## 2019-08-07 DIAGNOSIS — E1165 Type 2 diabetes mellitus with hyperglycemia: Secondary | ICD-10-CM | POA: Diagnosis not present

## 2019-08-07 DIAGNOSIS — Z79899 Other long term (current) drug therapy: Secondary | ICD-10-CM | POA: Diagnosis not present

## 2019-08-07 DIAGNOSIS — E559 Vitamin D deficiency, unspecified: Secondary | ICD-10-CM | POA: Diagnosis not present

## 2019-08-07 DIAGNOSIS — I10 Essential (primary) hypertension: Secondary | ICD-10-CM | POA: Diagnosis not present

## 2019-08-07 DIAGNOSIS — E1129 Type 2 diabetes mellitus with other diabetic kidney complication: Secondary | ICD-10-CM | POA: Diagnosis not present

## 2019-08-07 DIAGNOSIS — M109 Gout, unspecified: Secondary | ICD-10-CM | POA: Diagnosis not present

## 2019-08-07 DIAGNOSIS — Z6836 Body mass index (BMI) 36.0-36.9, adult: Secondary | ICD-10-CM | POA: Diagnosis not present

## 2019-08-10 NOTE — Telephone Encounter (Signed)
Results for lexi and lab work relayed to patient. Copy of sent to Dr. Delena Bali.

## 2019-08-12 NOTE — Telephone Encounter (Signed)
lexi and echo results relayed, copy sent to Dr. Delena Bali

## 2019-08-19 ENCOUNTER — Other Ambulatory Visit: Payer: Self-pay

## 2019-08-19 ENCOUNTER — Ambulatory Visit (INDEPENDENT_AMBULATORY_CARE_PROVIDER_SITE_OTHER): Payer: PPO

## 2019-08-19 DIAGNOSIS — I10 Essential (primary) hypertension: Secondary | ICD-10-CM | POA: Diagnosis not present

## 2019-08-19 DIAGNOSIS — R0600 Dyspnea, unspecified: Secondary | ICD-10-CM

## 2019-08-19 NOTE — Progress Notes (Signed)
Complete echocardiogram has been performed.  Jimmy Macarthur Lorusso RDCS, RVT 

## 2019-08-24 ENCOUNTER — Encounter: Payer: Self-pay | Admitting: Cardiology

## 2019-08-24 ENCOUNTER — Other Ambulatory Visit: Payer: Self-pay

## 2019-08-24 ENCOUNTER — Ambulatory Visit (INDEPENDENT_AMBULATORY_CARE_PROVIDER_SITE_OTHER): Payer: PPO | Admitting: Cardiology

## 2019-08-24 VITALS — BP 168/102 | HR 77 | Ht 76.5 in | Wt 294.8 lb

## 2019-08-24 DIAGNOSIS — Z7901 Long term (current) use of anticoagulants: Secondary | ICD-10-CM | POA: Diagnosis not present

## 2019-08-24 DIAGNOSIS — R931 Abnormal findings on diagnostic imaging of heart and coronary circulation: Secondary | ICD-10-CM | POA: Diagnosis not present

## 2019-08-24 DIAGNOSIS — Z9889 Other specified postprocedural states: Secondary | ICD-10-CM | POA: Diagnosis not present

## 2019-08-24 DIAGNOSIS — I517 Cardiomegaly: Secondary | ICD-10-CM | POA: Diagnosis not present

## 2019-08-24 DIAGNOSIS — E088 Diabetes mellitus due to underlying condition with unspecified complications: Secondary | ICD-10-CM | POA: Diagnosis not present

## 2019-08-24 DIAGNOSIS — E785 Hyperlipidemia, unspecified: Secondary | ICD-10-CM

## 2019-08-24 DIAGNOSIS — I722 Aneurysm of renal artery: Secondary | ICD-10-CM | POA: Diagnosis not present

## 2019-08-24 DIAGNOSIS — I10 Essential (primary) hypertension: Secondary | ICD-10-CM | POA: Diagnosis not present

## 2019-08-24 DIAGNOSIS — I482 Chronic atrial fibrillation, unspecified: Secondary | ICD-10-CM | POA: Diagnosis not present

## 2019-08-24 DIAGNOSIS — Z01818 Encounter for other preprocedural examination: Secondary | ICD-10-CM | POA: Diagnosis not present

## 2019-08-24 MED ORDER — NITROGLYCERIN 0.4 MG SL SUBL: 0.4000 mg | SUBLINGUAL_TABLET | SUBLINGUAL | 4 refills | Status: DC | PRN

## 2019-08-24 NOTE — Addendum Note (Signed)
Addended by: Ashok Norris on: 08/24/2019 11:55 AM   Modules accepted: Orders

## 2019-08-24 NOTE — H&P (View-Only) (Signed)
Cardiology Office Note:    Date:  08/24/2019   ID:  Ryan Vaughan, DOB 16-Aug-1946, MRN [id number]  PCP:  Ryan Dress, MD  Cardiologist:  Jenean Lindau, MD   Referring MD: Ryan Dress, MD    ASSESSMENT:    1. Chronic atrial fibrillation (Ryan Vaughan)   2. Abnormal nuclear cardiac imaging test   3. Essential hypertension   4. 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Ryan Vaughan)   5. Chronic anticoagulation   6. Diabetes mellitus due to underlying condition with unspecified complications (Ryan Vaughan)   7. Dyslipidemia   8. H/O carotid endarterectomy    PLAN:    In order of problems listed above:  1. Abnormal nuclear stress testing in a patient with known coronary artery disease and dyspnea on exertion which is worsening: I discussed my findings with the patient at extensive length.  His symptoms are concerning.  He is undergone coronary angiography in the remote past and was told to have obstructive disease not amenable to intervention.  It has been approximately 10 years.  I could not access that report however in view of abnormal stress test and ventricular arrhythmias I recommended coronary angiography.  His symptoms are also concerning.I discussed coronary angiography and left heart catheterization with the patient at extensive length. Procedure, benefits and potential risks were explained. Patient had multiple questions which were answered to the patient's satisfaction. Patient agreed and consented for the procedure. Further recommendations will be made based on the findings of the coronary angiography. In the interim. The patient has any significant symptoms he knows to go to the nearest emergency room. 2. Sublingual nitroglycerin prescription was sent, its protocol and 911 protocol explained and the patient vocalized understanding questions were answered to the patient's satisfaction 3. Essential hypertension: Blood pressure is stable 4. Mixed dyslipidemia and diabetes mellitus: Diet was discussed and  the patient vocalized understanding.  He plans to do better to lose weight. 5. Further recommendations will be made based on the findings of the coronary angiography.  6. Chronic atrial fibrillation:I discussed with the patient atrial fibrillation, disease process. Management and therapy including rate and rhythm control, anticoagulation benefits and potential risks were discussed extensively with the patient. Patient had multiple questions which were answered to patient's satisfaction.    Medication Adjustments/Labs and Tests Ordered: Current medicines are reviewed at length with the patient today.  Concerns regarding medicines are outlined above.  No orders of the defined types were placed in this encounter.  No orders of the defined types were placed in this encounter.    No chief complaint on file.    History of Present Illness:    Ryan Vaughan is a 73 y.o. male.  Patient has past medical history of coronary artery disease, essential hypertension, dyslipidemia, diabetes mellitus, obesity and permanent atrial fibrillation.  He was having symptoms of shortness of breath and therefore he underwent stress testing which was abnormal.  Detailed report mentioned below.  Echocardiogram was unremarkable event monitoring revealed nonsustained ventricular tachycardia.  At the time of my evaluation, the patient is alert awake oriented and in no distress.  He does give history of shortness of breath on exertion.  History reviewed. No pertinent past medical history.  History reviewed. No pertinent surgical history.  Current Medications: Current Meds  Medication Sig  . amLODipine (NORVASC) 5 MG tablet Take 5 mg by mouth daily.  Marland Kitchen aspirin EC 81 MG tablet Take 81 mg by mouth daily.  Marland Kitchen atorvastatin (LIPITOR) 80 MG tablet TAKE 1  TABLET BY MOUTH ONCE DAILY  . carvedilol (COREG) 12.5 MG tablet TAKE ONE (1) TABLET BY MOUTH TWO (2) TIMES DAILY  . DIGOX 250 MCG tablet TAKE ONE TABLET BY MOUTH ONCE  DAILY  . gabapentin (NEURONTIN) 800 MG tablet Take 800 mg by mouth daily as needed.  Marland Kitchen glimepiride (AMARYL) 4 MG tablet Take 4 mg by mouth daily.  . hydrALAZINE (APRESOLINE) 10 MG tablet TAKE 1 TABLET BY MOUTH DAILY  . losartan (COZAAR) 100 MG tablet Take 100 mg by mouth daily.   . metFORMIN (GLUCOPHAGE) 1000 MG tablet Take 1,000 mg by mouth 2 (two) times daily with a meal.  . nitroGLYCERIN (NITROSTAT) 0.4 MG SL tablet Place 0.4 mg under the tongue.  . rivaroxaban (XARELTO) 20 MG TABS tablet Take 1 tablet (20 mg total) by mouth daily.  . Vitamin D, Ergocalciferol, (DRISDOL) 1.25 MG (50000 UT) CAPS capsule Take 50,000 Units by mouth every 7 (seven) days.     Allergies:   Penicillins   Social History   Socioeconomic History  . Marital status: Married    Spouse name: Not on file  . Number of children: Not on file  . Years of education: Not on file  . Highest education level: Not on file  Occupational History  . Not on file  Social Needs  . Financial resource strain: Not on file  . Food insecurity    Worry: Not on file    Inability: Not on file  . Transportation needs    Medical: Not on file    Non-medical: Not on file  Tobacco Use  . Smoking status: Never Smoker  . Smokeless tobacco: Never Used  Substance and Sexual Activity  . Alcohol use: No  . Drug use: No  . Sexual activity: Not on file  Lifestyle  . Physical activity    Days per week: Not on file    Minutes per session: Not on file  . Stress: Not on file  Relationships  . Social Herbalist on phone: Not on file    Gets together: Not on file    Attends religious service: Not on file    Active member of club or organization: Not on file    Attends meetings of clubs or organizations: Not on file    Relationship status: Not on file  Other Topics Concern  . Not on file  Social History Narrative  . Not on file     Family History: The patient's family history is not on file.  ROS:   Please see the  history of present illness.    All other systems reviewed and are negative.  EKGs/Labs/Other Studies Reviewed:    The following studies were reviewed today: IMPRESSIONS    1. Left ventricular ejection fraction, by visual estimation, is 55 to 60%. The left ventricle has normal function. Left ventricular septal wall thickness was severely increased. Severely increased left ventricular posterior wall thickness. There is  severely increased left ventricular hypertrophy.  2. Moderately dilated left ventricular internal cavity size.  3. Global right ventricle has normal systolic function.The right ventricular size is moderately enlarged. No increase in right ventricular wall thickness.  4. Left atrial size was moderately to severelydilated.  5. Right atrial size was moderately dilated.  6. The mitral valve is normal in structure. No evidence of mitral valve regurgitation. No evidence of mitral stenosis.  7. The tricuspid valve is normal in structure. Tricuspid valve regurgitation is mild.  8. The aortic valve  is tricuspid. Aortic valve regurgitation is not visualized. Mild to moderate aortic valve sclerosis/calcification without any evidence of aortic stenosis.  9. The pulmonic valve was normal in structure. Pulmonic valve regurgitation is not visualized. 10. 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 11. Normal pulmonary artery systolic pressure. 12. The inferior vena cava is normal in size with greater than 50% respiratory variability, suggesting right atrial pressure of 3 mmHg. 13. Left ventricular diastolic parameters are indeterminate. 14. Moderate mitral annular calcification.  Study Highlights   Nuclear stress EF: 45%.  There was no ST segment deviation noted during stress.  Defect 1: There is a medium defect of moderate severity present in the basal inferior and mid inferior location.  Findings consistent with ischemia.  This is an intermediate risk  study.  The left ventricular ejection fraction is mildly decreased (45-54%).   EVENT MONITOR REPORT:   Patient was monitored from 07/15/2019 to 07/18/2019. Indication:                    Atrial fibrillation Ordering physician:  Jenean Lindau, MD  Referring physician:        Jenean Lindau, MD    Baseline rhythm: Atrial fibrillation  Minimum heart rate: 34 BPM.  Average heart rate: 68 BPM.  Maximal heart rate 155 BPM.  Atrial arrhythmia: Patient remained in atrial fibrillation throughout the monitoring  Ventricular arrhythmia: Few PVCs.  One 7 beat nonsustained ventricular tachycardia.  Conduction abnormality: None significant  Symptoms: None significant   Conclusion:  Abnormal event monitor.  Patient remained in atrial fibrillation with fairly controlled ventricular rates.  One 7 beat nonsustained ventricular tachycardia as mentioned above.  Interpreting  cardiologist: Jenean Lindau, MD  Date: 08/05/2019 2:57 PM    Recent Labs: 08/25/2018: ALT 9; Hemoglobin 14.3; Platelets 197; TSH 1.600 08/05/2019: BUN 13; Creatinine, Ser 0.82; Magnesium 1.9; Potassium 4.8; Sodium 138  Recent Lipid Panel    Component Value Date/Time   CHOL 141 08/25/2018 0839   TRIG 80 08/25/2018 0839   HDL 41 08/25/2018 0839   CHOLHDL 3.4 08/25/2018 0839   LDLCALC 84 08/25/2018 0839    Physical Exam:    VS:  BP (!) 168/102 (BP Location: Left Arm, Patient Position: Sitting, Cuff Size: Normal)   Pulse 77   Ht 6' 4.5" (1.943 m)   Wt 294 lb 12.8 oz (133.7 kg)   SpO2 96%   BMI 35.42 kg/m     Wt Readings from Last 3 Encounters:  08/24/19 294 lb 12.8 oz (133.7 kg)  08/04/19 293 lb (132.9 kg)  07/15/19 293 lb (132.9 kg)     GEN: Patient is in no acute distress HEENT: Normal NECK: No JVD; No carotid bruits LYMPHATICS: No lymphadenopathy CARDIAC: Hear sounds regular, 2/6 systolic murmur at the apex. RESPIRATORY:  Clear to auscultation without rales, wheezing or rhonchi   ABDOMEN: Soft, non-tender, non-distended MUSCULOSKELETAL:  No edema; No deformity  SKIN: Warm and dry NEUROLOGIC:  Alert and oriented x 3 PSYCHIATRIC:  Normal affect   Signed, Jenean Lindau, MD  08/24/2019 10:59 AM    [facility]

## 2019-08-24 NOTE — Addendum Note (Signed)
Addended by: Beckey Rutter on: 08/24/2019 11:08 AM   Modules accepted: Orders

## 2019-08-24 NOTE — Progress Notes (Signed)
Cardiology Office Note:    Date:  08/24/2019   ID:  Ryan Vaughan, DOB 04-15-1946, MRN [id number]  PCP:  Nicoletta Dress, MD  Cardiologist:  Jenean Lindau, MD   Referring MD: Nicoletta Dress, MD    ASSESSMENT:    1. Chronic atrial fibrillation ([place])   2. Abnormal nuclear cardiac imaging test   3. Essential hypertension   4. 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 Chronic anticoagulation   6. Diabetes mellitus due to underlying condition with unspecified complications ([place])   7. Dyslipidemia   8. H/O carotid endarterectomy    PLAN:    In order of problems listed above:  1. Abnormal nuclear stress testing in a patient with known coronary artery disease and dyspnea on exertion which is worsening: I discussed my findings with the patient at extensive length.  His symptoms are concerning.  He is undergone coronary angiography in the remote past and was told to have obstructive disease not amenable to intervention.  It has been approximately 10 years.  I could not access that report however in view of abnormal stress test and ventricular arrhythmias I recommended coronary angiography.  His symptoms are also concerning.I discussed coronary angiography and left heart catheterization with the patient at extensive length. Procedure, benefits and potential risks were explained. Patient had multiple questions which were answered to the patient's satisfaction. Patient agreed and consented for the procedure. Further recommendations will be made based on the findings of the coronary angiography. In the interim. The patient has any significant symptoms he knows to go to the nearest emergency room. 2. Sublingual nitroglycerin prescription was sent, its protocol and 911 protocol explained and the patient vocalized understanding questions were answered to the patient's satisfaction 3. Essential hypertension: Blood pressure is stable 4. Mixed dyslipidemia and diabetes mellitus: Diet was discussed and  the patient vocalized understanding.  He plans to do better to lose weight. 5. Further recommendations will be made based on the findings of the coronary angiography.  6. Chronic atrial fibrillation:I discussed with the patient atrial fibrillation, disease process. Management and therapy including rate and rhythm control, anticoagulation benefits and potential risks were discussed extensively with the patient. Patient had multiple questions which were answered to patient's satisfaction.    Medication Adjustments/Labs and Tests Ordered: Current medicines are reviewed at length with the patient today.  Concerns regarding medicines are outlined above.  No orders of the defined types were placed in this encounter.  No orders of the defined types were placed in this encounter.    No chief complaint on file.    History of Present Illness:    Ryan Vaughan is a 73 y.o. male.  Patient has past medical history of coronary artery disease, essential hypertension, dyslipidemia, diabetes mellitus, obesity and permanent atrial fibrillation.  He was having symptoms of shortness of breath and therefore he underwent stress testing which was abnormal.  Detailed report mentioned below.  Echocardiogram was unremarkable event monitoring revealed nonsustained ventricular tachycardia.  At the time of my evaluation, the patient is alert awake oriented and in no distress.  He does give history of shortness of breath on exertion.  History reviewed. No pertinent past medical history.  History reviewed. No pertinent surgical history.  Current Medications: Current Meds  Medication Sig  . amLODipine (NORVASC) 5 MG tablet Take 5 mg by mouth daily.  Marland Kitchen aspirin EC 81 MG tablet Take 81 mg by mouth daily.  Marland Kitchen atorvastatin (LIPITOR) 80 MG tablet TAKE 1  TABLET BY MOUTH ONCE DAILY  . carvedilol (COREG) 12.5 MG tablet TAKE ONE (1) TABLET BY MOUTH TWO (2) TIMES DAILY  . DIGOX 250 MCG tablet TAKE ONE TABLET BY MOUTH ONCE  DAILY  . gabapentin (NEURONTIN) 800 MG tablet Take 800 mg by mouth daily as needed.  Marland Kitchen glimepiride (AMARYL) 4 MG tablet Take 4 mg by mouth daily.  . hydrALAZINE (APRESOLINE) 10 MG tablet TAKE 1 TABLET BY MOUTH DAILY  . losartan (COZAAR) 100 MG tablet Take 100 mg by mouth daily.   . metFORMIN (GLUCOPHAGE) 1000 MG tablet Take 1,000 mg by mouth 2 (two) times daily with a meal.  . nitroGLYCERIN (NITROSTAT) 0.4 MG SL tablet Place 0.4 mg under the tongue.  . rivaroxaban (XARELTO) 20 MG TABS tablet Take 1 tablet (20 mg total) by mouth daily.  . Vitamin D, Ergocalciferol, (DRISDOL) 1.25 MG (50000 UT) CAPS capsule Take 50,000 Units by mouth every 7 (seven) days.     Allergies:   Penicillins   Social History   Socioeconomic History  . Marital status: Married    Spouse name: Not on file  . Number of children: Not on file  . Years of education: Not on file  . Highest education level: Not on file  Occupational History  . Not on file  Social Needs  . Financial resource strain: Not on file  . Food insecurity    Worry: Not on file    Inability: Not on file  . Transportation needs    Medical: Not on file    Non-medical: Not on file  Tobacco Use  . Smoking status: Never Smoker  . Smokeless tobacco: Never Used  Substance and Sexual Activity  . Alcohol use: No  . Drug use: No  . Sexual activity: Not on file  Lifestyle  . Physical activity    Days per week: Not on file    Minutes per session: Not on file  . Stress: Not on file  Relationships  . Social Herbalist on phone: Not on file    Gets together: Not on file    Attends religious service: Not on file    Active member of club or organization: Not on file    Attends meetings of clubs or organizations: Not on file    Relationship status: Not on file  Other Topics Concern  . Not on file  Social History Narrative  . Not on file     Family History: The patient's family history is not on file.  ROS:   Please see the  history of present illness.    All other systems reviewed and are negative.  EKGs/Labs/Other Studies Reviewed:    The following studies were reviewed today: IMPRESSIONS    1. Left ventricular ejection fraction, by visual estimation, is 55 to 60%. The left ventricle has normal function. Left ventricular septal wall thickness was severely increased. Severely increased left ventricular posterior wall thickness. There is  severely increased left ventricular hypertrophy.  2. Moderately dilated left ventricular internal cavity size.  3. Global right ventricle has normal systolic function.The right ventricular size is moderately enlarged. No increase in right ventricular wall thickness.  4. Left atrial size was moderately to severelydilated.  5. Right atrial size was moderately dilated.  6. The mitral valve is normal in structure. No evidence of mitral valve regurgitation. No evidence of mitral stenosis.  7. The tricuspid valve is normal in structure. Tricuspid valve regurgitation is mild.  8. The aortic valve  is tricuspid. Aortic valve regurgitation is not visualized. Mild to moderate aortic valve sclerosis/calcification without any evidence of aortic stenosis.  9. The pulmonic valve was normal in structure. Pulmonic valve regurgitation is not visualized. 10. 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 11. Normal pulmonary artery systolic pressure. 12. The inferior vena cava is normal in size with greater than 50% respiratory variability, suggesting right atrial pressure of 3 mmHg. 13. Left ventricular diastolic parameters are indeterminate. 14. Moderate mitral annular calcification.  Study Highlights   Nuclear stress EF: 45%.  There was no ST segment deviation noted during stress.  Defect 1: There is a medium defect of moderate severity present in the basal inferior and mid inferior location.  Findings consistent with ischemia.  This is an intermediate risk  study.  The left ventricular ejection fraction is mildly decreased (45-54%).   EVENT MONITOR REPORT:   Patient was monitored from 07/15/2019 to 07/18/2019. Indication:                    Atrial fibrillation Ordering physician:  Jenean Lindau, MD  Referring physician:        Jenean Lindau, MD    Baseline rhythm: Atrial fibrillation  Minimum heart rate: 34 BPM.  Average heart rate: 68 BPM.  Maximal heart rate 155 BPM.  Atrial arrhythmia: Patient remained in atrial fibrillation throughout the monitoring  Ventricular arrhythmia: Few PVCs.  One 7 beat nonsustained ventricular tachycardia.  Conduction abnormality: None significant  Symptoms: None significant   Conclusion:  Abnormal event monitor.  Patient remained in atrial fibrillation with fairly controlled ventricular rates.  One 7 beat nonsustained ventricular tachycardia as mentioned above.  Interpreting  cardiologist: Jenean Lindau, MD  Date: 08/05/2019 2:57 PM    Recent Labs: 08/25/2018: ALT 9; Hemoglobin 14.3; Platelets 197; TSH 1.600 08/05/2019: BUN 13; Creatinine, Ser 0.82; Magnesium 1.9; Potassium 4.8; Sodium 138  Recent Lipid Panel    Component Value Date/Time   CHOL 141 08/25/2018 0839   TRIG 80 08/25/2018 0839   HDL 41 08/25/2018 0839   CHOLHDL 3.4 08/25/2018 0839   LDLCALC 84 08/25/2018 0839    Physical Exam:    VS:  BP (!) 168/102 (BP Location: Left Arm, Patient Position: Sitting, Cuff Size: Normal)   Pulse 77   Ht 6' 4.5" (1.943 m)   Wt 294 lb 12.8 oz (133.7 kg)   SpO2 96%   BMI 35.42 kg/m     Wt Readings from Last 3 Encounters:  08/24/19 294 lb 12.8 oz (133.7 kg)  08/04/19 293 lb (132.9 kg)  07/15/19 293 lb (132.9 kg)     GEN: Patient is in no acute distress HEENT: Normal NECK: No JVD; No carotid bruits LYMPHATICS: No lymphadenopathy CARDIAC: Hear sounds regular, 2/6 systolic murmur at the apex. RESPIRATORY:  Clear to auscultation without rales, wheezing or rhonchi   ABDOMEN: Soft, non-tender, non-distended MUSCULOSKELETAL:  No edema; No deformity  SKIN: Warm and dry NEUROLOGIC:  Alert and oriented x 3 PSYCHIATRIC:  Normal affect   Signed, Jenean Lindau, MD  08/24/2019 10:59 AM    [facility]

## 2019-08-24 NOTE — Patient Instructions (Addendum)
Medication Instructions:  Your physician recommends that you continue on your current medications as directed. Please refer to the Current Medication list given to you today.  *If you need a refill on your cardiac medications before your next appointment, please call your pharmacy*  Lab Work: Your physician recommends that you return for lab work today: bmp, cbc  If you have labs (blood work) drawn today and your tests are completely normal, you will receive your results only by:  MyChart Message (if you have MyChart) OR  A paper copy in the mail If you have any lab test that is abnormal or we need to change your treatment, we will call you to review the results.  Testing/Procedures: A chest x-ray takes a picture of the organs and structures inside the chest, including the heart, lungs, and blood vessels. This test can show several things, including, whether the heart is enlarges; whether fluid is building up in the lungs; and whether pacemaker / defibrillator leads are still in place.     [place] [address] Dept: [phone] Loc: [place]  08/24/2019  You are scheduled for a Cardiac Catheterization on Friday, November 27 with Dr. Daneen Schick.  1. Please arrive at the [facility] (Main Entrance A) at [facility]: [address] [place], [place] [address] at 5:30 AM (This time is two hours before your procedure to ensure your preparation). Free valet parking service is available.   Special note: Every effort is made to have your procedure done on time. Please understand that emergencies sometimes delay scheduled procedures.  2. Diet: Do not eat solid foods after midnight.  The patient may have clear liquids until 5am upon the day of the procedure.  3. Labs: You will have labs drawn today.   4. Medication instructions in preparation for your  procedure:      Stop Xarelto on 08/26/2019   Do not take Diabetes Med Glucophage (Metformin) on the day of the procedure and HOLD 48 HOURS AFTER THE PROCEDURE.   HOLD Glimeperide the day of the procedure  On the morning of your procedure, take your Aspirin and any morning medicines NOT listed above.  You may use sips of water.  5. Plan for one night stay--bring personal belongings. 6. Bring a current list of your medications and current insurance cards. 7. You MUST have a responsible person to drive you home. 8. Someone MUST be with you the first 24 hours after you arrive home or your discharge will be delayed. 9. Please wear clothes that are easy to get on and off and wear slip-on shoes.  Thank you for allowing [place] to care for you!   -- [facility]   Follow-Up: At [facility], you and your health needs are our priority.  As part of our continuing mission to provide you with exceptional heart care, we have created designated Provider Care Teams.  These Care Teams include your primary Cardiologist (physician) and Advanced Practice Providers (APPs -  Physician Assistants and Nurse Practitioners) who all work together to provide you with the care you need, when you need it.  Your next appointment:   1 month(s)  The format for your next appointment:   In Person  Provider:   Jyl Heinz, MD  Other Instructions    Coronary Angiogram With Stent Coronary angiogram with stent placement is a procedure to widen  or open a narrow blood vessel of the heart (coronary artery). Arteries may become blocked by cholesterol buildup (plaques) in the lining of the wall. When a coronary artery becomes partially blocked, blood flow to that area decreases. This may lead to chest pain or a heart attack (myocardial infarction). A stent is a small piece of metal that looks like mesh or a spring. Stent placement may be done as treatment for a heart attack or right  after a coronary angiogram in which a blocked artery is found. Let your health care provider know about:  Any allergies you have.  All medicines you are taking, including vitamins, herbs, eye drops, creams, and over-the-counter medicines.  Any problems you or family members have had with anesthetic medicines.  Any blood disorders you have.  Any surgeries you have had.  Any medical conditions you have.  Whether you are pregnant or may be pregnant. What are the risks? Generally, this is a safe procedure. However, problems may occur, including:  Damage to the heart or its blood vessels.  A return of blockage.  Bleeding, infection, or bruising at the insertion site.  A collection of blood under the skin (hematoma) at the insertion site.  A blood clot in another part of the body.  Kidney injury.  Allergic reaction to the dye or contrast that is used.  Bleeding into the abdomen (retroperitoneal bleeding). What happens before the procedure? Staying hydrated Follow instructions from your health care provider about hydration, which may include:  Up to 2 hours before the procedure - you may continue to drink clear liquids, such as water, clear fruit juice, black coffee, and plain tea.  Eating and drinking restrictions Follow instructions from your health care provider about eating and drinking, which may include:  8 hours before the procedure - stop eating heavy meals or foods such as meat, fried foods, or fatty foods.  6 hours before the procedure - stop eating light meals or foods, such as toast or cereal.  2 hours before the procedure - stop drinking clear liquids. Ask your health care provider about:  Changing or stopping your regular medicines. This is especially important if you are taking diabetes medicines or blood thinners.  Taking medicines such as ibuprofen. These medicines can thin your blood. Do not take these medicines before your procedure if your health care  provider instructs you not to. Generally, aspirin is recommended before a procedure of passing a small, thin tube (catheter) through a blood vessel and into the heart (cardiac catheterization). What happens during the procedure?   An IV tube will be inserted into one of your veins.  You will be given one or more of the following: ? A medicine to help you relax (sedative). ? A medicine to numb the area where the catheter will be inserted into an artery (local anesthetic).  To reduce your risk of infection: ? Your health care team will wash or sanitize their hands. ? Your skin will be washed with soap. ? Hair may be removed from the area where the catheter will be inserted.  Using a guide wire, the catheter will be inserted into an artery. The location may be in your groin, in your wrist, or in the fold of your arm (near your elbow).  A type of X-ray (fluoroscopy) will be used to help guide the catheter to the opening of the arteries in the heart.  A dye will be injected into the catheter, and X-rays will be taken. The dye  will help to show where any narrowing or blockages are located in the arteries.  A tiny wire will be guided to the blocked spot, and a balloon will be inflated to make the artery wider.  The stent will be expanded and will crush the plaques into the wall of the vessel. The stent will hold the area open and improve the blood flow. Most stents have a drug coating to reduce the risk of the stent narrowing over time.  The artery may be made wider using a drill, laser, or other tools to remove plaques.  When the blood flow is better, the catheter will be removed. The lining of the artery will grow over the stent, which stays where it was placed. This procedure may vary among health care providers and hospitals. What happens after the procedure?  If the procedure is done through the leg, you will be kept in bed lying flat for about 6 hours. You will be instructed to not bend  and not cross your legs.  The insertion site will be checked frequently.  The pulse in your foot or wrist will be checked frequently.  You may have additional blood tests, X-rays, and a test that records the electrical activity of your heart (electrocardiogram, or ECG). This information is not intended to replace advice given to you by your health care provider. Make sure you discuss any questions you have with your health care provider. Document Released: 03/24/2003 Document Revised: 12/27/2017 Document Reviewed: 04/22/2016 Elsevier Patient Education  2020 [employer].   Chest X-Ray A chest X-ray is a painless test that uses radiation to create images of the structures inside of your chest. Chest X-rays are used to look for many health conditions, including heart failure, pneumonia, tuberculosis, rib fractures, breathing disorders, and cancer. They may be used to diagnose chest pain, constant coughing, or trouble breathing. Tell a health care provider about:  Any allergies you have.  All medicines you are taking, including vitamins, herbs, eye drops, creams, and over-the-counter medicines.  Any surgeries you have had.  Any medical conditions you have.  Whether you are pregnant or may be pregnant. What are the risks? Getting a chest X-ray is a safe procedure. However, you will be exposed to a small amount of radiation. Being exposed to too much radiation over a lifetime can increase the risk of cancer. This risk is small, but it may occur if you have many X-rays throughout your life. What happens before the procedure?  You may be asked to remove glasses, jewelry, and any other metal objects.  You will be asked to undress from the waist up. You may be given a hospital gown to wear.  You may be asked to wear a protective lead apron to protect parts of your body from radiation. What happens during the procedure?   You will be asked to stand still as each picture is taken to get  the best possible images.  You will be asked to take a deep breath and hold your breath for a few seconds.  The X-ray machine will create a picture of your chest using a tiny burst of radiation. This is painless.  More pictures may be taken from other angles. Typically, one picture will be taken while you face the X-ray camera, and another picture will be taken from the side while you stand. If you cannot stand, you may be asked to lie down. The procedure may vary among health care providers and hospitals. What happens after  the procedure?  The X-ray(s) will be reviewed by your health care provider or an X-ray (radiology) specialist.  It is up to you to get your test results. Ask your health care provider, or the department that is doing the test, when your results will be ready.  Your health care provider will tell you if you need more tests or a follow-up exam. Keep all follow-up visits as told by your health care provider. This is important. Summary  A chest X-ray is a safe, painless test that is used to examine the inside of the chest, heart, and lungs.  You will need to undress from the waist up and remove jewelry and metal objects before the procedure.  You will be exposed to a small amount of radiation during the procedure.  The X-ray machine will take one or more pictures of your chest while you remain as still as possible.  Later, a health care provider or specialist will review the test results with you. This information is not intended to replace advice given to you by your health care provider. Make sure you discuss any questions you have with your health care provider. Document Released: 11/13/2016 Document Revised: 01/07/2019 Document Reviewed: 11/13/2016 Elsevier Patient Education  2020 [employer].

## 2019-08-25 ENCOUNTER — Telehealth: Payer: Self-pay | Admitting: *Deleted

## 2019-08-25 ENCOUNTER — Other Ambulatory Visit (HOSPITAL_COMMUNITY)
Admission: RE | Admit: 2019-08-25 | Discharge: 2019-08-25 | Disposition: A | Discharge status: Home / Self Care | Payer: PPO | Source: Ambulatory Visit | Attending: Interventional Cardiology | Admitting: Interventional Cardiology

## 2019-08-25 DIAGNOSIS — Z01812 Encounter for preprocedural laboratory examination: Secondary | ICD-10-CM | POA: Insufficient documentation

## 2019-08-25 DIAGNOSIS — Z20828 Contact with and (suspected) exposure to other viral communicable diseases: Secondary | ICD-10-CM | POA: Insufficient documentation

## 2019-08-25 LAB — CBC
Hematocrit: 41 % (ref 37.5–51.0)
Hemoglobin: 14.1 g/dL (ref 13.0–17.7)
MCH: 33.3 pg — ABNORMAL HIGH (ref 26.6–33.0)
MCHC: 34.4 g/dL (ref 31.5–35.7)
MCV: 97 fL (ref 79–97)
Platelets: 159 10*3/uL (ref 150–450)
RBC: 4.23 x10E6/uL (ref 4.14–5.80)
RDW: 12 % (ref 11.6–15.4)
WBC: 7.6 10*3/uL (ref 3.4–10.8)

## 2019-08-25 LAB — SARS CORONAVIRUS 2 (TAT 6-24 HRS): SARS Coronavirus 2: NEGATIVE

## 2019-08-25 LAB — BASIC METABOLIC PANEL
BUN/Creatinine Ratio: 17 (ref 10–24)
BUN: 16 mg/dL (ref 8–27)
CO2: 22 mmol/L (ref 20–29)
Calcium: 9.3 mg/dL (ref 8.6–10.2)
Chloride: 102 mmol/L (ref 96–106)
Creatinine, Ser: 0.93 mg/dL (ref 0.76–1.27)
GFR calc Af Amer: 94 mL/min/{1.73_m2} (ref >59)
GFR calc non Af Amer: 82 mL/min/{1.73_m2} (ref >59)
Glucose: 235 mg/dL — ABNORMAL HIGH (ref 65–99)
Potassium: 5.2 mmol/L (ref 3.5–5.2)
Sodium: 140 mmol/L (ref 134–144)

## 2019-08-25 NOTE — Telephone Encounter (Signed)
Pt contacted pre-catheterization scheduled at [facility] for: Friday November 27,2020 7:30 AM Verified arrival time and place: [facility]) at: 5:30 AM   No solid food after midnight prior to cath, clear liquids until 5 AM day of procedure. Contrast allergy: no  Hold: Xarelto-none 08/26/19 until post procedure. Glimepiride-AM of procedure. Metformin-day of procedure and 48 hours post procedure.  Except hold medications AM meds can be  taken pre-cath with sip of water including: ASA 81 mg   Confirmed patient has responsible adult to drive home post procedure and observe 24 hours after arriving home: yes  Currently, due to Covid-19 pandemic, only one support person will be allowed with patient. Must be the same support person for that patient's entire stay, will be screened and required to wear a mask. They will be asked to wait in the waiting room for the duration of the patient's stay.  Patients are required to wear a mask when they enter the hospital.     COVID-19 Pre-Screening Questions:  . In the past 7 to 10 days have you had a cough,  shortness of breath, headache, congestion, fever (100 or greater) body aches, chills, sore throat, or sudden loss of taste or sense of smell? no . Have you been around anyone with known Covid 19? no . Have you been around anyone who is awaiting Covid 19 test results in the past 7 to 10 days? no . Have you been around anyone who has been exposed to Covid 19, or has mentioned symptoms of Covid 19 within the past 7 to 10 days? no   I reviewed procedure/mask/visitor instructions, Covid-19 screening questions with patient, he verbalized understanding, thanked me for call.

## 2019-08-26 ENCOUNTER — Telehealth: Payer: Self-pay

## 2019-08-26 NOTE — Telephone Encounter (Signed)
Patient notified that chest xray was ok, no further questions

## 2019-08-26 NOTE — Telephone Encounter (Signed)
Patient notified that chest xray and labs was ok, no further questions

## 2019-08-26 NOTE — Telephone Encounter (Signed)
-----   Message from Jenean Lindau, MD sent at 08/25/2019  8:15 AM EST ----- The results of the study is unremarkable. Please inform patient. I will discuss in detail at next appointment. Cc  primary care/referring physician Jenean Lindau, MD 08/25/2019 8:15 AM

## 2019-08-28 ENCOUNTER — Encounter (HOSPITAL_COMMUNITY)
Admission: RE | Disposition: A | Discharge status: Home / Self Care | Payer: Self-pay | Source: Home / Self Care | Attending: Interventional Cardiology

## 2019-08-28 ENCOUNTER — Other Ambulatory Visit: Payer: Self-pay

## 2019-08-28 ENCOUNTER — Encounter (HOSPITAL_COMMUNITY): Payer: Self-pay | Admitting: Interventional Cardiology

## 2019-08-28 ENCOUNTER — Ambulatory Visit (HOSPITAL_COMMUNITY)
Admission: RE | Admit: 2019-08-28 | Discharge: 2019-08-28 | Disposition: A | Discharge status: Home / Self Care | Payer: PPO | Attending: Interventional Cardiology | Admitting: Interventional Cardiology

## 2019-08-28 ENCOUNTER — Encounter: Payer: Self-pay | Admitting: Physician Assistant

## 2019-08-28 DIAGNOSIS — Z7984 Long term (current) use of oral hypoglycemic drugs: Secondary | ICD-10-CM | POA: Insufficient documentation

## 2019-08-28 DIAGNOSIS — E088 Diabetes mellitus due to underlying condition with unspecified complications: Secondary | ICD-10-CM

## 2019-08-28 DIAGNOSIS — E785 Hyperlipidemia, unspecified: Secondary | ICD-10-CM | POA: Diagnosis not present

## 2019-08-28 DIAGNOSIS — R931 Abnormal findings on diagnostic imaging of heart and coronary circulation: Secondary | ICD-10-CM

## 2019-08-28 DIAGNOSIS — Z7901 Long term (current) use of anticoagulants: Secondary | ICD-10-CM

## 2019-08-28 DIAGNOSIS — R9439 Abnormal result of other cardiovascular function study: Secondary | ICD-10-CM | POA: Insufficient documentation

## 2019-08-28 DIAGNOSIS — I25119 Atherosclerotic heart disease of native coronary artery with unspecified angina pectoris: Secondary | ICD-10-CM | POA: Diagnosis not present

## 2019-08-28 DIAGNOSIS — Z79899 Other long term (current) drug therapy: Secondary | ICD-10-CM | POA: Diagnosis not present

## 2019-08-28 DIAGNOSIS — E669 Obesity, unspecified: Secondary | ICD-10-CM | POA: Insufficient documentation

## 2019-08-28 DIAGNOSIS — Z9889 Other specified postprocedural states: Secondary | ICD-10-CM

## 2019-08-28 DIAGNOSIS — I493 Ventricular premature depolarization: Secondary | ICD-10-CM | POA: Diagnosis not present

## 2019-08-28 DIAGNOSIS — I10 Essential (primary) hypertension: Secondary | ICD-10-CM | POA: Diagnosis not present

## 2019-08-28 DIAGNOSIS — E119 Type 2 diabetes mellitus without complications: Secondary | ICD-10-CM | POA: Diagnosis present

## 2019-08-28 DIAGNOSIS — R0609 Other forms of dyspnea: Secondary | ICD-10-CM | POA: Insufficient documentation

## 2019-08-28 DIAGNOSIS — Z880 Allergy status to penicillin: Secondary | ICD-10-CM | POA: Insufficient documentation

## 2019-08-28 DIAGNOSIS — Z6835 Body mass index (BMI) 35.0-35.9, adult: Secondary | ICD-10-CM | POA: Diagnosis not present

## 2019-08-28 DIAGNOSIS — I472 Ventricular tachycardia: Secondary | ICD-10-CM | POA: Diagnosis not present

## 2019-08-28 DIAGNOSIS — I482 Chronic atrial fibrillation, unspecified: Secondary | ICD-10-CM | POA: Diagnosis not present

## 2019-08-28 DIAGNOSIS — Z7982 Long term (current) use of aspirin: Secondary | ICD-10-CM | POA: Insufficient documentation

## 2019-08-28 DIAGNOSIS — I722 Aneurysm of renal artery: Secondary | ICD-10-CM | POA: Insufficient documentation

## 2019-08-28 HISTORY — PX: LEFT HEART CATH AND CORONARY ANGIOGRAPHY: CATH118249

## 2019-08-28 LAB — GLUCOSE, CAPILLARY: Glucose-Capillary: 206 mg/dL — ABNORMAL HIGH (ref 70–99)

## 2019-08-28 SURGERY — LEFT HEART CATH AND CORONARY ANGIOGRAPHY
Anesthesia: LOCAL

## 2019-08-28 MED ORDER — IOHEXOL 350 MG/ML SOLN
INTRAVENOUS | Status: DC | PRN
  Administered 2019-08-28: 09:00:00 90 mL via INTRA_ARTERIAL

## 2019-08-28 MED ORDER — HEPARIN (PORCINE) IN NACL 1000-0.9 UT/500ML-% IV SOLN
INTRAVENOUS | Status: AC
  Filled 2019-08-28: qty 1000

## 2019-08-28 MED ORDER — SODIUM CHLORIDE 0.9% FLUSH: 3.0000 mL | INTRAVENOUS | Status: DC | PRN

## 2019-08-28 MED ORDER — SODIUM CHLORIDE 0.9 % IV SOLN: INTRAVENOUS | Status: DC

## 2019-08-28 MED ORDER — ASPIRIN 81 MG PO CHEW: 81.0000 mg | CHEWABLE_TABLET | ORAL | Status: DC

## 2019-08-28 MED ORDER — VERAPAMIL HCL 2.5 MG/ML IV SOLN
INTRAVENOUS | Status: DC | PRN
  Administered 2019-08-28: 10 mL via INTRA_ARTERIAL

## 2019-08-28 MED ORDER — SODIUM CHLORIDE 0.9% FLUSH: 3.0000 mL | Freq: Two times a day (BID) | INTRAVENOUS | Status: DC

## 2019-08-28 MED ORDER — LABETALOL HCL 5 MG/ML IV SOLN: 10.0000 mg | INTRAVENOUS | Status: DC | PRN

## 2019-08-28 MED ORDER — FENTANYL CITRATE (PF) 100 MCG/2ML IJ SOLN
INTRAMUSCULAR | Status: AC
  Filled 2019-08-28: qty 2

## 2019-08-28 MED ORDER — HYDRALAZINE HCL 20 MG/ML IJ SOLN: 10.0000 mg | INTRAMUSCULAR | Status: DC | PRN

## 2019-08-28 MED ORDER — SODIUM CHLORIDE 0.9 % WEIGHT BASED INFUSION
3.0000 mL/kg/h | INTRAVENOUS | Status: AC
  Administered 2019-08-28: 3 mL/kg/h via INTRAVENOUS

## 2019-08-28 MED ORDER — HEPARIN (PORCINE) IN NACL 1000-0.9 UT/500ML-% IV SOLN
INTRAVENOUS | Status: DC | PRN
  Administered 2019-08-28 (×2): 500 mL

## 2019-08-28 MED ORDER — LIDOCAINE HCL (PF) 1 % IJ SOLN
INTRAMUSCULAR | Status: DC | PRN
  Administered 2019-08-28: 2 mL via SUBCUTANEOUS

## 2019-08-28 MED ORDER — FENTANYL CITRATE (PF) 100 MCG/2ML IJ SOLN
INTRAMUSCULAR | Status: DC | PRN
  Administered 2019-08-28: 25 ug via INTRAVENOUS

## 2019-08-28 MED ORDER — LIDOCAINE HCL (PF) 1 % IJ SOLN
INTRAMUSCULAR | Status: AC
  Filled 2019-08-28: qty 30

## 2019-08-28 MED ORDER — OXYCODONE HCL 5 MG PO TABS: 5.0000 mg | ORAL_TABLET | ORAL | Status: DC | PRN

## 2019-08-28 MED ORDER — SODIUM CHLORIDE 0.9 % WEIGHT BASED INFUSION: 1.0000 mL/kg/h | INTRAVENOUS | Status: DC

## 2019-08-28 MED ORDER — SODIUM CHLORIDE 0.9 % IV SOLN: 250.0000 mL | INTRAVENOUS | Status: DC | PRN

## 2019-08-28 MED ORDER — LABETALOL HCL 5 MG/ML IV SOLN
INTRAVENOUS | Status: AC
  Filled 2019-08-28: qty 4

## 2019-08-28 MED ORDER — VERAPAMIL HCL 2.5 MG/ML IV SOLN
INTRAVENOUS | Status: AC
  Filled 2019-08-28: qty 2

## 2019-08-28 MED ORDER — HEPARIN SODIUM (PORCINE) 1000 UNIT/ML IJ SOLN
INTRAMUSCULAR | Status: AC
  Filled 2019-08-28: qty 1

## 2019-08-28 MED ORDER — ACETAMINOPHEN 325 MG PO TABS: 650.0000 mg | ORAL_TABLET | ORAL | Status: DC | PRN

## 2019-08-28 MED ORDER — HEPARIN SODIUM (PORCINE) 1000 UNIT/ML IJ SOLN
INTRAMUSCULAR | Status: DC | PRN
  Administered 2019-08-28: 6000 [IU] via INTRAVENOUS

## 2019-08-28 MED ORDER — LABETALOL HCL 5 MG/ML IV SOLN
INTRAVENOUS | Status: DC | PRN
  Administered 2019-08-28 (×2): 10 mg via INTRAVENOUS

## 2019-08-28 MED ORDER — MIDAZOLAM HCL 2 MG/2ML IJ SOLN
INTRAMUSCULAR | Status: DC | PRN
  Administered 2019-08-28 (×2): 1 mg via INTRAVENOUS

## 2019-08-28 MED ORDER — ONDANSETRON HCL 4 MG/2ML IJ SOLN: 4.0000 mg | Freq: Four times a day (QID) | INTRAMUSCULAR | Status: DC | PRN

## 2019-08-28 MED ORDER — MIDAZOLAM HCL 2 MG/2ML IJ SOLN
INTRAMUSCULAR | Status: AC
  Filled 2019-08-28: qty 2

## 2019-08-28 SURGICAL SUPPLY — 10 items
CATH 5FR JL3.5 JR4 ANG PIG MP (CATHETERS) ×2 IMPLANT
DEVICE RAD COMP TR BAND LRG (VASCULAR PRODUCTS) ×2 IMPLANT
GLIDESHEATH SLEND A-KIT 6F 22G (SHEATH) ×2 IMPLANT
GUIDEWIRE INQWIRE 1.5J.035X260 (WIRE) ×1 IMPLANT
INQWIRE 1.5J .035X260CM (WIRE) ×2
KIT HEART LEFT (KITS) ×2 IMPLANT
PACK CARDIAC CATHETERIZATION (CUSTOM PROCEDURE TRAY) ×2 IMPLANT
SHEATH PROBE COVER 6X72 (BAG) ×2 IMPLANT
TRANSDUCER W/STOPCOCK (MISCELLANEOUS) ×2 IMPLANT
TUBING CIL FLEX 10 FLL-RA (TUBING) ×2 IMPLANT

## 2019-08-28 NOTE — Interval H&P Note (Signed)
Cath Lab Visit (complete for each Cath Lab visit)  Clinical Evaluation Leading to the Procedure:   ACS: No.  Non-ACS:    Anginal Classification: CCS Vaughan  Anti-ischemic medical therapy: Minimal Therapy (1 class of medications)  Non-Invasive Test Results: Intermediate-risk stress test findings: cardiac mortality 1-3%/year  Prior CABG: No previous CABG      History and Physical Interval Note:  08/28/2019 8:10 AM  Ryan Vaughan  has presented today for surgery, with the diagnosis of abnormal stress test.  The various methods of treatment have been discussed with the patient and family. After consideration of risks, benefits and other options for treatment, the patient has consented to  Procedure(s): LEFT HEART CATH AND CORONARY ANGIOGRAPHY (N/A) as a surgical intervention.  The patient's history has been reviewed, patient examined, no change in status, stable for surgery.  I have reviewed the patient's chart and labs.  Questions were answered to the patient's satisfaction.     Ryan Vaughan

## 2019-08-28 NOTE — CV Procedure (Signed)
   Left heart catheterization with coronary angiography via the right radial using real-time vascular ultrasound for guidance.  Widely patent left main  Eccentric 40% proximal/ostial LAD, 85% eccentric mid LAD, 90% ostial first diagonal (small).  Proximal circumflex diffuse 30% narrowing, mid circumflex eccentric 60 to 70% narrowing.  Circumflex supplies collaterals to the distal RCA which has been chronically occluded for years.  Two small obtuse marginal branches arise beyond the stenosis and contain no significant obstruction.  The first obtuse marginal arises very proximal and is widely patent.  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  The continuation of the right coronary is totally occluded and the large left ventricular branch is being supplied by collaterals from the circumflex.  Overall normal LV function with EF estimated to be 50%.  LVEDP is normal despite very high systemic blood pressure.  Recommend consideration of surgical grafting  Tight blood pressure control, resume anticoagulation therapy this evening, take antihypertensive therapy when he arrives home.

## 2019-08-28 NOTE — Progress Notes (Signed)
   I am covering card master role today.  Dr. 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  He is letting the patient go home and recommends outpatient cardiothoracic surgery evaluation.  I contacted their office but I believe they are closed for the Thanksgiving holiday.  I sent a message to coordinator Levonne Spiller and also CCed the message back to our card master box to follow-up on Monday when the office reopens.  Nalin Mazzocco PA-C

## 2019-08-28 NOTE — Discharge Instructions (Signed)
He should take his blood pressure medication as soon as he arrives home. Xarelto should be resumed later this evening or in a.m. depending upon when he ordinarily takes it.  He should not take the Xarelto before he has had antihypertensive therapy. HOLD METFORMIN// RESTART Monday MORNING 11/30   Radial Site Care  This sheet gives you information about how to care for yourself after your procedure. Your health care provider may also give you more specific instructions. If you have problems or questions, contact your health care provider. What can I expect after the procedure? After the procedure, it is common to have:  Bruising and tenderness at the catheter insertion area. Follow these instructions at home: Medicines  Take over-the-counter and prescription medicines only as told by your health care provider. Insertion site care  Follow instructions from your health care provider about how to take care of your insertion site. Make sure you: ? Wash your hands with soap and water before you change your bandage (dressing). If soap and water are not available, use hand sanitizer. ? Change your dressing as told by your health care provider. ? Leave stitches (sutures), skin glue, or adhesive strips in place. These skin closures may need to stay in place for 2 weeks or longer. If adhesive strip edges start to loosen and curl up, you may trim the loose edges. Do not remove adhesive strips completely unless your health care provider tells you to do that.  Check your insertion site every day for signs of infection. Check for: ? Redness, swelling, or pain. ? Fluid or blood. ? Pus or a bad smell. ? Warmth.  Do not take baths, swim, or use a hot tub until your health care provider approves.  You may shower 24-48 hours after the procedure, or as directed by your health care provider. ? Remove the dressing and gently wash the site with plain soap and water. ? Pat the area dry with a clean  towel. ? Do not rub the site. That could cause bleeding.  Do not apply powder or lotion to the site. Activity   For 24 hours after the procedure, or as directed by your health care provider: ? Do not flex or bend the affected arm. ? Do not push or pull heavy objects with the affected arm. ? Do not drive yourself home from the hospital or clinic. You may drive 24 hours after the procedure unless your health care provider tells you not to. ? Do not operate machinery or power tools.  Do not lift anything that is heavier than 10 lb (4.5 kg), or the limit that you are told, until your health care provider says that it is safe.  Ask your health care provider when it is okay to: ? Return to work or school. ? Resume usual physical activities or sports. ? Resume sexual activity. General instructions  If the catheter site starts to bleed, raise your arm and put firm pressure on the site. If the bleeding does not stop, get help right away. This is a medical emergency.  If you went home on the same day as your procedure, a responsible adult should be with you for the first 24 hours after you arrive home.  Keep all follow-up visits as told by your health care provider. This is important. Contact a health care provider if:  You have a fever.  You have redness, swelling, or yellow drainage around your insertion site. Get help right away if:  You have  unusual pain at the radial site.  The catheter insertion area swells very fast.  The insertion area is bleeding, and the bleeding does not stop when you hold steady pressure on the area.  Your arm or hand becomes pale, cool, tingly, or numb. These symptoms may represent a serious problem that is an emergency. Do not wait to see if the symptoms will go away. Get medical help right away. Call your local emergency services (911 in the [place] Do not drive yourself to the hospital. Summary  After the procedure, it is common to have bruising and  tenderness at the site.  Follow instructions from your health care provider about how to take care of your radial site wound. Check the wound every day for signs of infection.  Do not lift anything that is heavier than 10 lb (4.5 kg), or the limit that you are told, until your health care provider says that it is safe. This information is not intended to replace advice given to you by your health care provider. Make sure you discuss any questions you have with your health care provider. Document Released: 10/20/2010 Document Revised: 10/23/2017 Document Reviewed: 10/23/2017 Elsevier Patient Education  2020 [employer].

## 2019-08-31 ENCOUNTER — Telehealth: Payer: Self-pay | Admitting: Cardiology

## 2019-08-31 NOTE — Telephone Encounter (Signed)
Called x2

## 2019-08-31 NOTE — Telephone Encounter (Signed)
Patient states cardiac cath was cancelled and he was referred to cardiac surgeon Dr. Cyndia Bent. Note routed to [facility]

## 2019-08-31 NOTE — Telephone Encounter (Signed)
Wants to talk to someone about his cath that was done Friday

## 2019-09-02 ENCOUNTER — Institutional Professional Consult (permissible substitution): Payer: PPO | Admitting: Surgery

## 2019-09-02 ENCOUNTER — Other Ambulatory Visit: Payer: Self-pay

## 2019-09-02 ENCOUNTER — Encounter: Payer: Self-pay | Admitting: Surgery

## 2019-09-02 VITALS — BP 210/130 | HR 87 | Temp 97.7°F | Resp 20 | Ht 76.0 in | Wt 290.0 lb

## 2019-09-02 DIAGNOSIS — I2510 Atherosclerotic heart disease of native coronary artery without angina pectoris: Secondary | ICD-10-CM | POA: Diagnosis not present

## 2019-09-02 NOTE — Progress Notes (Signed)
Cardiothoracic Surgery Consultation   PCP is Nicoletta Dress, MD Referring Provider is Belva Crome, MD  Chief Complaint  Patient presents with  . Coronary Artery Disease    Surgical eval, Cardiac Cath 08/28/19, ECHO 08/19/19    HPI:  The patient is a 73 year old gentleman with a history of hypertension, dyslipidemia, diabetes, obesity, permanent atrial fibrillation on anticoagulation, and coronary disease who presents with recent onset of exertional shortness of breath.  This is typically happen with walking up stairs quickly or doing any other type of exertion or he has to move quickly.  It is relieved with rest.  He has not had any chest pain or pressure.  He denies lower extremity edema.  He had a gated nuclear stress test on 08/05/2019 which showed an ejection fraction of 45%.  There was a medium defect of moderate severity in the basal inferior and mid inferior walls consistent with ischemia.  The patient did report dyspnea during the stress test but had no chest pain and no EKG changes.  He was noted to have a blood pressure of 212/100 at rest and this increased to 222/97 during peak stress.  An echocardiogram showed an ejection fraction of 55 to 60% with severe LVH.  The right and left atrium were moderately dilated.  There was no significant valvular disease.  Catheterization on 08/28/2019 showed severe three-vessel coronary disease.  The mid LAD had an eccentric 85% stenosis after the small first diagonal.  The left circumflex had 60% mid vessel eccentric narrowing that did not appear hemodynamically significant.  The right coronary artery had a 90 to 95% mid to distal stenosis before entering a fusiform aneurysm.  There is a large posterior descending branch.  The continuation of the right coronary artery was occluded.  LVEDP was normal.  Ejection fraction was 45 to 50%.  History reviewed. No pertinent past medical history.  Past Surgical History:  Procedure Laterality Date   . LEFT HEART CATH AND CORONARY ANGIOGRAPHY N/A 08/28/2019   Procedure: LEFT HEART CATH AND CORONARY ANGIOGRAPHY;  Surgeon: Belva Crome, MD;  Location: [facility];  Service: Cardiovascular;  Laterality: N/A;    History reviewed. No pertinent family history.  Social History Social History   Tobacco Use  . Smoking status: Never Smoker  . Smokeless tobacco: Never Used  Substance Use Topics  . Alcohol use: No  . Drug use: No    Current Outpatient Medications  Medication Sig Dispense Refill  . acetaminophen (TYLENOL) 500 MG tablet Take 1,000-1,500 mg by mouth every 8 (eight) hours as needed for moderate pain or headache.    Marland Kitchen amLODipine (NORVASC) 5 MG tablet Take 5 mg by mouth daily in the afternoon.     Marland Kitchen aspirin EC 81 MG tablet Take 81 mg by mouth daily.    Marland Kitchen atorvastatin (LIPITOR) 80 MG tablet TAKE 1 TABLET BY MOUTH ONCE DAILY (Patient taking differently: Take 80 mg by mouth at bedtime. ) 90 tablet 0  . carvedilol (COREG) 12.5 MG tablet TAKE ONE (1) TABLET BY MOUTH TWO (2) TIMES DAILY (Patient taking differently: Take 12.5 mg by mouth 2 (two) times daily. ) 180 tablet 0  . Bowie 250 MCG tablet TAKE ONE TABLET BY MOUTH ONCE DAILY (Patient taking differently: Take 0.25 mg by mouth at bedtime. ) 90 tablet 1  . gabapentin (NEURONTIN) 800 MG tablet Take 800 mg by mouth See admin instructions. Take 800 mg at night may take a second dose during  the day as needed for pain    . glimepiride (AMARYL) 4 MG tablet Take 4 mg by mouth at bedtime.     . hydrALAZINE (APRESOLINE) 10 MG tablet TAKE 1 TABLET BY MOUTH DAILY (Patient taking differently: Take 10 mg by mouth 2 (two) times daily. ) 90 tablet 1  . losartan (COZAAR) 100 MG tablet Take 100 mg by mouth at bedtime.     . metFORMIN (GLUCOPHAGE) 1000 MG tablet Take 1,000 mg by mouth 2 (two) times daily with a meal.    . nitroGLYCERIN (NITROSTAT) 0.4 MG SL tablet Place 1 tablet (0.4 mg total) under the tongue every 5 (five) minutes as needed  for chest pain. 30 tablet 4  . rivaroxaban (XARELTO) 20 MG TABS tablet Take 1 tablet (20 mg total) by mouth daily. (Patient taking differently: Take 20 mg by mouth at bedtime. ) 180 tablet 3  . Vitamin D, Ergocalciferol, (DRISDOL) 1.25 MG (50000 UT) CAPS capsule Take 50,000 Units by mouth every Friday.      No current facility-administered medications for this visit.     Allergies  Allergen Reactions  . Penicillins Rash    Did it involve swelling of the face/tongue/throat, SOB, or low BP? No Did it involve sudden or severe rash/hives, skin peeling, or any reaction on the inside of your mouth or nose? No Did you need to seek medical attention at a hospital or doctor's office? No When did it last happen?2-3 years If all above answers are "NO", may proceed with cephalosporin use.     Review of Systems  Constitutional: Positive for activity change and fatigue.  HENT: Positive for hearing loss.   Eyes: Negative.   Respiratory: Positive for shortness of breath. Negative for chest tightness.   Cardiovascular: Positive for palpitations. Negative for chest pain and leg swelling.  Gastrointestinal: Negative.   Endocrine: Negative.   Genitourinary: Positive for frequency.  Musculoskeletal: Positive for arthralgias and joint swelling.  Skin: Negative.   Allergic/Immunologic: Negative.   Neurological:       Hx of TIA  Hematological: Negative.   Psychiatric/Behavioral: Negative.     BP (!) 210/130 (BP Location: Right Arm, Patient Position: Sitting, Cuff Size: Large) Comment: manual check  Pulse 87   Temp 97.7 F (36.5 C) (Skin)   Resp 20   Ht 6\' 4"  (1.93 m)   Wt 290 lb (131.5 kg)   SpO2 97% Comment: RA  BMI 35.30 kg/m  Physical Exam Constitutional:      Appearance: He is obese.  HENT:     Head: Normocephalic and atraumatic.  Eyes:     Extraocular Movements: Extraocular movements intact.     Pupils: Pupils are equal, round, and reactive to light.  Neck:      Musculoskeletal: Neck supple.     Vascular: No carotid bruit.  Cardiovascular:     Rate and Rhythm: Normal rate. Rhythm irregular.     Pulses: Normal pulses.     Heart sounds: Normal heart sounds. No murmur.  Pulmonary:     Effort: Pulmonary effort is normal.     Breath sounds: Normal breath sounds.  Abdominal:     Tenderness: There is no abdominal tenderness.  Musculoskeletal: Normal range of motion.        General: No swelling.  Lymphadenopathy:     Cervical: No cervical adenopathy.  Skin:    General: Skin is warm and dry.  Neurological:     General: No focal deficit present.     Mental  Status: He is alert and oriented to person, place, and time.  Psychiatric:        Mood and Affect: Mood normal.        Behavior: Behavior normal.      Diagnostic Tests:  ECHOCARDIOGRAM REPORT       Patient Name:   Ryan Vaughan Date of Exam: 08/19/2019 Medical Rec #:  [id number]       Height:       76.0 in Accession #:    [id number]      Weight:       293.0 lb Date of Birth:  [birth year]        BSA:          2.61 m Patient Age:    61 years        BP:           136/86 mmHg Patient Gender: M               HR:           72 bpm. Exam Location:  [place]  Procedure: 2D Echo  Indications:    Dyspnea on exertion [R06.00] - Primary   History:        Patient has no prior history of Echocardiogram examinations and                 Patient has prior history of Echocardiogram examinations, most                 recent 08/06/2017. Carotid Disease, Arrythmias:Atrial                 Fibrillation; Risk Factors:Hypertension and Diabetes.   Sonographer:    Luane School Referring Phys: [place]    1. Left ventricular ejection fraction, by visual estimation, is 55 to 60%. The left ventricle has normal function. Left ventricular septal wall thickness was severely increased. Severely increased left ventricular posterior wall thickness. There is  severely increased left  ventricular hypertrophy.  2. Moderately dilated left ventricular internal cavity size.  3. Global right ventricle has normal systolic function.The right ventricular size is moderately enlarged. No increase in right ventricular wall thickness.  4. Left atrial size was moderately to severelydilated.  5. Right atrial size was moderately dilated.  6. The mitral valve is normal in structure. No evidence of mitral valve regurgitation. No evidence of mitral stenosis.  7. The tricuspid valve is normal in structure. Tricuspid valve regurgitation is mild.  8. The aortic valve is tricuspid. Aortic valve regurgitation is not visualized. Mild to moderate aortic valve sclerosis/calcification without any evidence of aortic stenosis.  9. The pulmonic valve was normal in structure. Pulmonic valve regurgitation is not visualized. 10. 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 11. Normal pulmonary artery systolic pressure. 12. The inferior vena cava is normal in size with greater than 50% respiratory variability, suggesting right atrial pressure of 3 mmHg. 13. Left ventricular diastolic parameters are indeterminate. 14. Moderate mitral annular calcification.  FINDINGS  Left Ventricle: Left ventricular ejection fraction, by visual estimation, is 55 to 60%. The left ventricle has normal function. The left ventricular internal cavity size was moderately dilated left ventricle. Severely increased left ventricular  posterior wall thickness. There is severely increased left ventricular hypertrophy. Concentric left ventricular hypertrophy. Left ventricular diastolic parameters are indeterminate. Right Ventricle: The right ventricular size is moderately enlarged. No increase in right ventricular wall thickness. Global RV systolic function  is has normal systolic function. The tricuspid regurgitant velocity is 2.05 m/s, and with an assumed right  atrial pressure of 3 mmHg, the estimated right  ventricular systolic pressure is normal at 19.9 mmHg.  Left Atrium: Left atrial size was moderately to severely dilated.  Right Atrium: Right atrial size was moderately dilated  Pericardium: There is no evidence of pericardial effusion.  Mitral Valve: The mitral valve is normal in structure. Moderate mitral annular calcification. No evidence of mitral valve stenosis by observation. No evidence of mitral valve regurgitation.  Tricuspid Valve: The tricuspid valve is normal in structure. Tricuspid valve regurgitation is mild.  Aortic Valve: The aortic valve is tricuspid. Aortic valve regurgitation is not visualized. Mild to moderate aortic valve sclerosis/calcification is present, without any evidence of aortic stenosis.  Pulmonic Valve: The pulmonic valve was normal in structure. Pulmonic valve regurgitation is not visualized. No evidence of pulmonic stenosis.  Aorta: The aortic root is normal in size and structure. 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  Venous: The pulmonary veins were not well visualized. The inferior vena cava is normal in size with greater than 50% respiratory variability, suggesting right atrial pressure of 3 mmHg.  IAS/Shunts: No atrial level shunt detected by color flow Doppler. No ventricular septal defect is seen or detected. There is no evidence of an atrial septal defect.     LEFT VENTRICLE PLAX 2D LVIDd:         6.40 cm       Diastology LVIDs:         4.30 cm       LV e' lateral:   6.42 cm/s LV PW:         1.60 cm       LV E/e' lateral: 15.3 LV IVS:        1.70 cm       LV e' medial:    3.15 cm/s LV SV:         125 ml        LV E/e' medial:  31.3 LV SV Index:   46.29   LV Volumes (MOD) LV area d, A2C:    24.90 cm LV area d, A4C:    30.90 cm LV area s, A2C:    16.50 cm LV area s, A4C:    19.10 cm LV major d, A2C:   8.29 cm LV major d, A4C:   6.97 cm LV major s, A2C:   7.62 cm LV major s, A4C:   6.31 cm  LV vol d, MOD A2C: 64.9 ml LV vol d, MOD A4C: 112.4 ml LV vol s, MOD A2C: 31.1 ml LV vol s, MOD A4C: 47.7 ml LV SV MOD A2C:     33.8 ml LV SV MOD A4C:     112.4 ml LV SV MOD BP:      50.0 ml  RIGHT VENTRICLE             IVC RV S prime:     10.20 cm/s  IVC diam: 1.90 cm TAPSE (M-mode): 2.2 cm  LEFT ATRIUM              Index       RIGHT ATRIUM           Index LA diam:        5.00 cm  1.92 cm/m  RA Area:     28.40 cm LA Vol (A2C):   127.0 ml 48.74 ml/m RA Volume:  104.00 ml 39.91 ml/m LA Vol (A4C):   110.0 ml 42.22 ml/m LA Biplane Vol: 124.0 ml 47.59 ml/m  AORTIC VALVE LVOT Vmax:   72.40 cm/s LVOT Vmean:  45.800 cm/s LVOT VTI:    0.124 m   AORTA Ao Root diam: 4.10 cm Ao Asc diam:  4.10 cm  MITRAL VALVE                        TRICUSPID VALVE MV Area (PHT): 4.10 cm             TR Peak grad:   16.9 mmHg MV PHT:        53.65 msec           TR Vmax:        219.00 cm/s MV Decel Time: 185 msec MV E velocity: 98.50 cm/s 103 cm/s  SHUNTS MV A velocity: 1.46 cm/s  70.3 cm/s Systemic VTI: 0.12 m MV E/A ratio:  67.47      1.5    Shirlee More MD Electronically signed by Shirlee More MD Signature Date/Time: 08/19/2019/4:21:58 PM    Physicians  Panel Physicians Referring Physician Case Authorizing Physician  Belva Crome, MD (Primary)    Procedures  LEFT HEART CATH AND CORONARY ANGIOGRAPHY  Conclusion   Widely patent left main coronary artery  Mid LAD contains eccentric 85% stenosis after the first diagonal.  The first diagonal is small and contains ostial 90% stenosis.  A large ramus intermedius arises from the distal left main but is free of significant obstruction.  Circumflex gives origin to a small first obtuse marginal, in the mid vessel there is eccentric 60 to 70% narrowing.  2 smaller obtuse marginals arise distally and supplies collaterals to a large right coronary left ventricular branch which apparently has been totally occluded for greater than 10  years.  The right coronary artery is large.  It is totally occluded in the continuation after a small left ventricular branch arises.  The distal vessel is supplied by collaterals from left circumflex to the right coronary.  The mid to distal right coronary contains 90 to 95% eccentric angulated stenosis that enters a fusiform aneurysm =/> 4.5 mm in diameter).  The native vessel pre and post aneurysm is 3.5 mm.  The PDA is large and graftable.  Overall normal left ventricular function with EF 45 to 50%.  Normal LVEDP.  RECOMMENDATIONS:   TCTS consultation for consideration of multivessel grafting.  Significant blood pressure elevation was present this morning because no antihypertensive therapy was taken in the past 24 hours.  IV labetalol was given to improve blood pressure.  He should take his blood pressure medication as soon as he arrives home.  Xarelto should be resumed later this evening or in a.m. depending upon when he ordinarily takes it.  He should not take the Xarelto before he has had antihypertensive therapy.  Recommendations  Antiplatelet/Anticoag Recommend to resume Rivaroxaban, at currently prescribed dose and frequency on 08/29/2019. Recommend concurrent antiplatelet therapy of Aspirin 81 mg daily.  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 Intravenous sedation with Versed and fentanyl was administered. 1% Xylocaine was infiltrated  to achieve local analgesia. Using real-time vascular ultrasound, a double wall stick with an angiocath was utilized to obtain intra-arterial access. A VUS image was saved for the  record.The modified Seldinger technique was used to place a 73F " Slender" sheath in the right radial artery. Weight based heparin was administered. Coronary angiography was done using 5 F catheters. Right coronary angiography was performed with a JR4. Left ventricular hemodymic recordings and angiography was done using the JR 4 catheter and hand injection. Left coronary angiography was performed with a JL 3.5 cm.  Hemostasis was achieved using a pneumatic band.  During this procedure the patient is administered a total of Versed 2 mg and Fentanyl 25 mcg to achieve and maintain moderate conscious sedation.  The patient's heart rate, blood pressure, and oxygen saturation are monitored continuously during the procedure. The period of conscious sedation is 26 minutes, of which I was present face-to-face 100% of this time. Estimated blood loss <50 mL.   During this procedure medications were administered to achieve and maintain moderate conscious sedation while the patient's heart rate, blood pressure, and oxygen saturation were continuously monitored and I was present face-to-face 100% of this time.  Medications (Filter: Administrations occurring from 08/28/19 0751 to 08/28/19 0905) (important)  Continuous medications are totaled by the amount administered until 08/28/19 0905.  Medication Rate/Dose/Volume Action  Date Time   midazolam (VERSED) injection (mg) 1 mg Given 08/28/19 0819   Total dose as of 08/28/19 0905 1 mg Given 0830   2 mg        fentaNYL (SUBLIMAZE) injection (mcg) 25 mcg Given 08/28/19 0819   Total dose as of 08/28/19 0905        25 mcg        labetalol (NORMODYNE) injection (mg) 10 mg Given 08/28/19 0821   Total dose as of 08/28/19 0905 10 mg Given 0859   20 mg        lidocaine (PF) (XYLOCAINE) 1 % injection (mL) 2 mL Given 08/28/19 0828   Total dose as of 08/28/19 0905        2 mL        Radial Cocktail/Verapamil only (mL) 10 mL Given 08/28/19 0830   Total dose as of 08/28/19  0905        10 mL        heparin injection (Units) 6,000 Units Given 08/28/19 0831   Total dose as of 08/28/19 0905        6,000 Units        iohexol (OMNIPAQUE) 350 MG/ML injection (mL) 90 mL Given 08/28/19 0849   Total dose as of 08/28/19 0905        90 mL        Heparin (Porcine) in NaCl 1000-0.9 UT/500ML-% SOLN (mL) 500 mL Given 08/28/19 0850   Total dose as of 08/28/19 0905 500 mL Given 0850   1,000 mL        Sedation Time  Sedation Time Physician-1: 25 minutes 45 seconds  Contrast  Medication Name Total Dose  iohexol (OMNIPAQUE) 350 MG/ML injection 90 mL    Radiation/Fluoro  Fluoro time: 2.3 (min) DAP: [id number] (mGycm2) Cumulative Air Kerma: 446 (mGy)  Coronary Findings  Diagnostic Dominance: Right Left Anterior Descending  Mid LAD lesion 80% stenosed  Mid LAD lesion is 80% stenosed.  First Diagonal Branch  Vessel is small in size.  1st Diag lesion 80% stenosed  1st Diag lesion is 80% stenosed.  Left Circumflex  Prox Cx  to Mid Cx lesion 65% stenosed  Prox Cx to Mid Cx lesion is 65% stenosed.  First Obtuse Marginal Branch  Vessel is small in size.  Right Coronary Artery  Prox RCA to Mid RCA lesion 55% stenosed  Prox RCA to Mid RCA lesion is 55% stenosed.  Mid RCA lesion 95% stenosed  Mid RCA lesion is 95% stenosed.  Mid RCA to Dist RCA lesion 0% stenosed  Non-stenotic Mid RCA to Dist RCA lesion.  Right Posterior Atrioventricular Artery  RPAV lesion 100% stenosed  RPAV lesion is 100% stenosed.  Third Right Posterolateral Branch  Vessel is large in size.  Collaterals  3rd RPL filled by collaterals from 3rd Mrg.    Intervention  No interventions have been documented. Wall Motion  Resting       All segments of the heart are normal.          Left Heart  Left Ventricle The left ventricular size is normal. The left ventricular systolic function is normal. LV end diastolic pressure is normal. The left ventricular ejection fraction is 45-50% by visual  estimate.  Coronary Diagrams  Diagnostic Dominance: Right  Intervention  Implants   No implant documentation for this case.  Syngo Images  Show images for CARDIAC CATHETERIZATION  Images on Long Term Storage  Show images for Cornelio, [place] to Procedure Log  Procedure Log    Hemo Data   Most Recent Value  AO Systolic Pressure 99991111 mmHg  AO Diastolic Pressure 94 mmHg  AO Mean Q000111Q mmHg  LV Systolic Pressure 99991111 mmHg  LV Diastolic Pressure 4 mmHg  LV EDP 11 mmHg  AOp Systolic Pressure A999333 mmHg  AOp Diastolic Pressure 88 mmHg  AOp Mean Pressure 123XX123 mmHg  LVp Systolic Pressure Q000111Q mmHg  LVp Diastolic Pressure 5 mmHg  LVp EDP Pressure 11 mmHg    Impression:  This 73 year old gentleman has severe three-vessel coronary disease with recent onset of exertional shortness of breath and fatigue without chest pain.  He had an abnormal nuclear stress test showing inferior ischemia.  Cardiac catheterization shows a 95% mid to distal right coronary stenosis supplying a large posterior descending branch.  The continuation of the right coronary artery is occluded with filling of the distal vessel by collaterals from the left circumflex.  Left circumflex has moderate nonobstructive stenosis.  The LAD has 80% mid vessel stenosis.  I agree that coronary artery bypass graft surgery is the best option for this diabetic patient.  He has permanent atrial fibrillation with large right and left atria.  I would plan to place a clip on his left atrial appendage.  I reviewed the cardiac catheterization films with him and his daughter and answered all of their questions. I discussed the operative procedure with the patient and family including alternatives, benefits and risks; including but not limited to bleeding, blood transfusion, infection, stroke, myocardial infarction, graft failure, heart block requiring a permanent pacemaker, organ dysfunction, and death.  Ryan Vaughan understands and  agrees to proceed.  He would like to go home and discuss with his wife before scheduling surgery.  Plan:  He will call us back to schedule coronary bypass graft surgery and clipping of the left atrial appendage.  He is on Xarelto and this will need to be stopped 4 days preoperatively.  I spent 60 minutes performing this consultation and > 50% of this time was spent face to face counseling and coordinating the care of this patient's severe three-vessel  coronary disease.   Gaye Pollack, MD [facility] [phone]

## 2019-09-03 ENCOUNTER — Other Ambulatory Visit: Payer: Self-pay | Admitting: *Deleted

## 2019-09-03 ENCOUNTER — Encounter: Payer: Self-pay | Admitting: *Deleted

## 2019-09-03 DIAGNOSIS — I2510 Atherosclerotic heart disease of native coronary artery without angina pectoris: Secondary | ICD-10-CM

## 2019-09-03 NOTE — Progress Notes (Signed)
Ryan Vaughan, this is your patient on who I performed the heart cath.

## 2019-09-07 ENCOUNTER — Other Ambulatory Visit: Payer: Self-pay | Admitting: Cardiology

## 2019-09-07 NOTE — Pre-Procedure Instructions (Addendum)
[place], [place] [address] Phone: [phone] Fax: [phone]     Your procedure is scheduled on Thursday December 10th.  Report to [facility] Main Entrance "A" at 5:30 A.M., and check in at the Admitting office.  Call this number if you have problems the morning of surgery:  [phone]  Call [phone] if you have any questions prior to your surgery date Monday-Friday 8am-4pm    Remember:  Do not eat or drink after midnight the night before your surgery     Take these medicines the morning of surgery with A SIP OF WATER  amLODipine (NORVASC) carvedilol (COREG)  hydrALAZINE (APRESOLINE)  nitroGLYCERIN (NITROSTAT) if needed acetaminophen (TYLENOL) if needed  Follow your surgeon's instructions on when to stop Xarelto- stop 4 days prior to surgery.  As of today, STOP taking any Aspirin (unless otherwise instructed by your surgeon), Aleve, Naproxen, Ibuprofen, Motrin, Advil, Goody's, BC's, all herbal medications, fish oil, and all vitamins.   HOW TO MANAGE YOUR DIABETES BEFORE AND AFTER SURGERY  Why is it important to control my blood sugar before and after surgery? . Improving blood sugar levels before and after surgery helps healing and can limit problems. . A way of improving blood sugar control is eating a healthy diet by: o  Eating less sugar and carbohydrates o  Increasing activity/exercise o  Talking with your doctor about reaching your blood sugar goals . High blood sugars (greater than 180 mg/dL) can raise your risk of infections and slow your recovery, so you will need to focus on controlling your diabetes during the weeks before surgery. . Make sure that the doctor who takes care of your diabetes knows about your planned surgery including the date and location.  How do I manage my blood sugar before surgery? . Check your blood sugar at least 4 times a day, starting 2 days before surgery, to  make sure that the level is not too high or low. o Check your blood sugar the morning of your surgery when you wake up and every 2 hours until you get to the Short Stay unit. . If your blood sugar is less than 70 mg/dL, you will need to treat for low blood sugar: o Do not take insulin. o Treat a low blood sugar (less than 70 mg/dL) with  cup of clear juice (cranberry or apple), 4 glucose tablets, OR glucose gel. Recheck blood sugar in 15 minutes after treatment (to make sure it is greater than 70 mg/dL). If your blood sugar is not greater than 70 mg/dL on recheck, call [phone] o  for further instructions. . Report your blood sugar to the short stay nurse when you get to Short Stay.  . If you are admitted to the hospital after surgery: o Your blood sugar will be checked by the staff and you will probably be given insulin after surgery (instead of oral diabetes medicines) to make sure you have good blood sugar levels. o The goal for blood sugar control after surgery is 80-180 mg/dL.     WHAT DO I DO ABOUT MY DIABETES MEDICATION?   Marland Kitchen Do not take oral diabetes medicines (pills): metFORMIN (GLUCOPHAGE) the morning of surgery.     The Morning of Surgery  Do not wear jewelry, make-up or nail polish.  Do not wear lotions, powders, or perfumes/colognes, or deodorant  Do not shave 48 hours prior to surgery.  Men may shave face and  neck.  Do not bring valuables to the hospital.  [facility] is not responsible for any belongings or valuables.  If you are a smoker, DO NOT Smoke 24 hours prior to surgery  If you wear a CPAP at night please bring your mask, tubing, and machine the morning of surgery   Remember that you must have someone to transport you home after your surgery, and remain with you for 24 hours if you are discharged the same day.   Please bring cases for contacts, glasses, hearing aids, dentures or bridgework because it cannot be worn into surgery.    Leave your suitcase  in the car.  After surgery it may be brought to your room.  For patients admitted to the hospital, discharge time will be determined by your treatment team.  Patients discharged the day of surgery will not be allowed to drive home.    Special instructions:   [place]- Preparing For Surgery  Before surgery, you can play an important role. Because skin is not sterile, your skin needs to be as free of germs as possible. You can reduce the number of germs on your skin by washing with CHG (chlorahexidine gluconate) Soap before surgery.  CHG is an antiseptic cleaner which kills germs and bonds with the skin to continue killing germs even after washing.    Oral Hygiene is also important to reduce your risk of infection.  Remember - BRUSH YOUR TEETH THE MORNING OF SURGERY WITH YOUR REGULAR TOOTHPASTE  Please do not use if you have an allergy to CHG or antibacterial soaps. If your skin becomes reddened/irritated stop using the CHG.  Do not shave (including legs and underarms) for at least 48 hours prior to first CHG shower. It is OK to shave your face.  Please follow these instructions carefully.   1. Shower the NIGHT BEFORE SURGERY and the MORNING OF SURGERY with CHG Soap.   2. If you chose to wash your hair, wash your hair first as usual with your normal shampoo.  3. After you shampoo, rinse your hair and body thoroughly to remove the shampoo.  4. Use CHG as you would any other liquid soap. You can apply CHG directly to the skin and wash gently with a scrungie or a clean washcloth.   5. Apply the CHG Soap to your body ONLY FROM THE NECK DOWN.  Do not use on open wounds or open sores. Avoid contact with your eyes, ears, mouth and genitals (private parts). Wash Face and genitals (private parts)  with your normal soap.   6. Wash thoroughly, paying special attention to the area where your surgery will be performed.  7. Thoroughly rinse your body with warm water from the neck down.  8. DO NOT  shower/wash with your normal soap after using and rinsing off the CHG Soap.  9. Pat yourself dry with a CLEAN TOWEL.  10. Wear CLEAN PAJAMAS to bed the night before surgery, wear comfortable clothes the morning of surgery  11. Place CLEAN SHEETS on your bed the night of your first shower and DO NOT SLEEP WITH PETS.    Day of Surgery:  Please shower the morning of surgery with the CHG soap Do not apply any deodorants/lotions. Please wear clean clothes to the hospital/surgery center.   Remember to brush your teeth WITH YOUR REGULAR TOOTHPASTE.   Please read over the following fact sheets that you were given.

## 2019-09-08 ENCOUNTER — Encounter (HOSPITAL_COMMUNITY)
Admission: RE | Admit: 2019-09-08 | Discharge: 2019-09-08 | Disposition: A | Discharge status: Home / Self Care | Payer: PPO | Source: Ambulatory Visit | Attending: Surgery | Admitting: Surgery

## 2019-09-08 ENCOUNTER — Encounter (HOSPITAL_COMMUNITY): Payer: Self-pay

## 2019-09-08 ENCOUNTER — Other Ambulatory Visit (HOSPITAL_COMMUNITY)
Admission: RE | Admit: 2019-09-08 | Discharge: 2019-09-08 | Disposition: A | Discharge status: Home / Self Care | Payer: PPO | Source: Ambulatory Visit | Attending: Surgery | Admitting: Surgery

## 2019-09-08 ENCOUNTER — Ambulatory Visit (HOSPITAL_BASED_OUTPATIENT_CLINIC_OR_DEPARTMENT_OTHER)
Admission: RE | Admit: 2019-09-08 | Discharge: 2019-09-08 | Disposition: A | Discharge status: Home / Self Care | Payer: PPO | Source: Ambulatory Visit | Attending: Surgery | Admitting: Surgery

## 2019-09-08 ENCOUNTER — Other Ambulatory Visit: Payer: Self-pay

## 2019-09-08 DIAGNOSIS — Z7984 Long term (current) use of oral hypoglycemic drugs: Secondary | ICD-10-CM | POA: Insufficient documentation

## 2019-09-08 DIAGNOSIS — Z7901 Long term (current) use of anticoagulants: Secondary | ICD-10-CM | POA: Diagnosis not present

## 2019-09-08 DIAGNOSIS — I4891 Unspecified atrial fibrillation: Secondary | ICD-10-CM | POA: Insufficient documentation

## 2019-09-08 DIAGNOSIS — Z4682 Encounter for fitting and adjustment of non-vascular catheter: Secondary | ICD-10-CM | POA: Diagnosis not present

## 2019-09-08 DIAGNOSIS — E785 Hyperlipidemia, unspecified: Secondary | ICD-10-CM | POA: Diagnosis not present

## 2019-09-08 DIAGNOSIS — Z79899 Other long term (current) drug therapy: Secondary | ICD-10-CM | POA: Diagnosis not present

## 2019-09-08 DIAGNOSIS — M479 Spondylosis, unspecified: Secondary | ICD-10-CM | POA: Diagnosis not present

## 2019-09-08 DIAGNOSIS — E1165 Type 2 diabetes mellitus with hyperglycemia: Secondary | ICD-10-CM | POA: Diagnosis not present

## 2019-09-08 DIAGNOSIS — E119 Type 2 diabetes mellitus without complications: Secondary | ICD-10-CM | POA: Insufficient documentation

## 2019-09-08 DIAGNOSIS — I2510 Atherosclerotic heart disease of native coronary artery without angina pectoris: Secondary | ICD-10-CM

## 2019-09-08 DIAGNOSIS — M170 Bilateral primary osteoarthritis of knee: Secondary | ICD-10-CM | POA: Diagnosis not present

## 2019-09-08 DIAGNOSIS — G8929 Other chronic pain: Secondary | ICD-10-CM | POA: Diagnosis present

## 2019-09-08 DIAGNOSIS — I10 Essential (primary) hypertension: Secondary | ICD-10-CM | POA: Insufficient documentation

## 2019-09-08 DIAGNOSIS — R918 Other nonspecific abnormal finding of lung field: Secondary | ICD-10-CM | POA: Diagnosis not present

## 2019-09-08 DIAGNOSIS — J90 Pleural effusion, not elsewhere classified: Secondary | ICD-10-CM | POA: Diagnosis not present

## 2019-09-08 DIAGNOSIS — Z01818 Encounter for other preprocedural examination: Secondary | ICD-10-CM | POA: Insufficient documentation

## 2019-09-08 DIAGNOSIS — Q250 Patent ductus arteriosus: Secondary | ICD-10-CM | POA: Diagnosis not present

## 2019-09-08 DIAGNOSIS — Z6835 Body mass index (BMI) 35.0-35.9, adult: Secondary | ICD-10-CM | POA: Diagnosis not present

## 2019-09-08 DIAGNOSIS — I440 Atrioventricular block, first degree: Secondary | ICD-10-CM | POA: Diagnosis present

## 2019-09-08 DIAGNOSIS — Z951 Presence of aortocoronary bypass graft: Secondary | ICD-10-CM | POA: Insufficient documentation

## 2019-09-08 DIAGNOSIS — I700 Atherosclerosis of aorta: Secondary | ICD-10-CM | POA: Diagnosis not present

## 2019-09-08 DIAGNOSIS — R001 Bradycardia, unspecified: Secondary | ICD-10-CM | POA: Diagnosis not present

## 2019-09-08 DIAGNOSIS — Z20828 Contact with and (suspected) exposure to other viral communicable diseases: Secondary | ICD-10-CM | POA: Insufficient documentation

## 2019-09-08 DIAGNOSIS — I482 Chronic atrial fibrillation, unspecified: Secondary | ICD-10-CM | POA: Diagnosis not present

## 2019-09-08 DIAGNOSIS — I351 Nonrheumatic aortic (valve) insufficiency: Secondary | ICD-10-CM | POA: Diagnosis not present

## 2019-09-08 DIAGNOSIS — Z7982 Long term (current) use of aspirin: Secondary | ICD-10-CM | POA: Diagnosis not present

## 2019-09-08 DIAGNOSIS — E8770 Fluid overload, unspecified: Secondary | ICD-10-CM | POA: Diagnosis not present

## 2019-09-08 DIAGNOSIS — Z880 Allergy status to penicillin: Secondary | ICD-10-CM | POA: Diagnosis not present

## 2019-09-08 DIAGNOSIS — M549 Dorsalgia, unspecified: Secondary | ICD-10-CM | POA: Diagnosis not present

## 2019-09-08 DIAGNOSIS — E669 Obesity, unspecified: Secondary | ICD-10-CM | POA: Diagnosis present

## 2019-09-08 DIAGNOSIS — I25119 Atherosclerotic heart disease of native coronary artery with unspecified angina pectoris: Secondary | ICD-10-CM | POA: Diagnosis not present

## 2019-09-08 DIAGNOSIS — R5383 Other fatigue: Secondary | ICD-10-CM | POA: Diagnosis not present

## 2019-09-08 DIAGNOSIS — I77810 Thoracic aortic ectasia: Secondary | ICD-10-CM | POA: Diagnosis present

## 2019-09-08 DIAGNOSIS — J9811 Atelectasis: Secondary | ICD-10-CM | POA: Diagnosis not present

## 2019-09-08 HISTORY — DX: Disorder of arteries and arterioles, unspecified: I77.9

## 2019-09-08 HISTORY — DX: Aneurysm of renal artery: I72.2

## 2019-09-08 HISTORY — DX: Type 2 diabetes mellitus without complications: E11.9

## 2019-09-08 HISTORY — DX: Atherosclerotic heart disease of native coronary artery without angina pectoris: I25.10

## 2019-09-08 HISTORY — DX: Unspecified atrial fibrillation: I48.91

## 2019-09-08 HISTORY — DX: Transient cerebral ischemic attack, unspecified: G45.9

## 2019-09-08 HISTORY — DX: Essential (primary) hypertension: I10

## 2019-09-08 LAB — COMPREHENSIVE METABOLIC PANEL
ALT: 22 U/L (ref 0–44)
AST: 35 U/L (ref 15–41)
Albumin: 3.8 g/dL (ref 3.5–5.0)
Alkaline Phosphatase: 45 U/L (ref 38–126)
Anion gap: 12 (ref 5–15)
BUN: 13 mg/dL (ref 8–23)
CO2: 19 mmol/L — ABNORMAL LOW (ref 22–32)
Calcium: 9 mg/dL (ref 8.9–10.3)
Chloride: 107 mmol/L (ref 98–111)
Creatinine, Ser: 1.03 mg/dL (ref 0.61–1.24)
GFR calc Af Amer: >60 mL/min (ref >60)
GFR calc non Af Amer: >60 mL/min (ref >60)
Glucose, Bld: 312 mg/dL — ABNORMAL HIGH (ref 70–99)
Potassium: 4.1 mmol/L (ref 3.5–5.1)
Sodium: 138 mmol/L (ref 135–145)
Total Bilirubin: 0.7 mg/dL (ref 0.3–1.2)
Total Protein: 6.9 g/dL (ref 6.5–8.1)

## 2019-09-08 LAB — TYPE AND SCREEN
ABO/RH(D): O NEG
Antibody Screen: NEGATIVE

## 2019-09-08 LAB — URINALYSIS, ROUTINE W REFLEX MICROSCOPIC
Bilirubin Urine: NEGATIVE
Glucose, UA: >=500 mg/dL — AB
Hgb urine dipstick: NEGATIVE
Ketones, ur: NEGATIVE mg/dL
Leukocytes,Ua: NEGATIVE
Nitrite: NEGATIVE
Protein, ur: 30 mg/dL — AB
Specific Gravity, Urine: 1.029 (ref 1.005–1.030)
pH: 5 (ref 5.0–8.0)

## 2019-09-08 LAB — CBC
HCT: 41.4 % (ref 39.0–52.0)
Hemoglobin: 14.3 g/dL (ref 13.0–17.0)
MCH: 33.7 pg (ref 26.0–34.0)
MCHC: 34.5 g/dL (ref 30.0–36.0)
MCV: 97.6 fL (ref 80.0–100.0)
Platelets: 145 10*3/uL — ABNORMAL LOW (ref 150–400)
RBC: 4.24 MIL/uL (ref 4.22–5.81)
RDW: 12.5 % (ref 11.5–15.5)
WBC: 6.8 10*3/uL (ref 4.0–10.5)
nRBC: 0 % (ref 0.0–0.2)

## 2019-09-08 LAB — BLOOD GAS, ARTERIAL
Acid-base deficit: 0.5 mmol/L (ref 0.0–2.0)
Bicarbonate: 23.7 mmol/L (ref 20.0–28.0)
Drawn by: 421801
FIO2: 21
O2 Saturation: 97.3 %
Patient temperature: 37
pCO2 arterial: 39 mmHg (ref 32.0–48.0)
pH, Arterial: 7.4 (ref 7.350–7.450)
pO2, Arterial: 91.1 mmHg (ref 83.0–108.0)

## 2019-09-08 LAB — ABO/RH: ABO/RH(D): O NEG

## 2019-09-08 LAB — APTT: aPTT: 30 seconds (ref 24–36)

## 2019-09-08 LAB — HEMOGLOBIN A1C
Hgb A1c MFr Bld: 9.1 % — ABNORMAL HIGH (ref 4.8–5.6)
Mean Plasma Glucose: 214.47 mg/dL

## 2019-09-08 LAB — SURGICAL PCR SCREEN
MRSA, PCR: NEGATIVE
Staphylococcus aureus: NEGATIVE

## 2019-09-08 LAB — SARS CORONAVIRUS 2 (TAT 6-24 HRS): SARS Coronavirus 2: NEGATIVE

## 2019-09-08 LAB — PROTIME-INR
INR: 1.1 (ref 0.8–1.2)
Prothrombin Time: 14.2 seconds (ref 11.4–15.2)

## 2019-09-08 LAB — GLUCOSE, CAPILLARY: Glucose-Capillary: 262 mg/dL — ABNORMAL HIGH (ref 70–99)

## 2019-09-08 NOTE — Consult Note (Signed)
Results for Ryan Vaughan, Ryan Vaughan (MRN [id number]) as of 09/08/2019 13:03  Ref. Range 09/08/2019 11:22  Glucose Latest Ref Range: 70 - 99 mg/dL 312 (H)  Hemoglobin A1C Latest Ref Range: 4.8 - 5.6 % 9.1 (H)    Received consult for Inpatient Diabetes Coordinator to alert team of upcoming surgery on 09/10/19. Noted CBG 262 at PAT visit and A1C 9.1% on 09/08/2019. 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 Sent secure chat to M.Fabio Asa, RN to question about DM hx and outpatient DM medications. RN reports patient has DM2 hx and takes Amaryl 4 mg QHS and Metformin 1000 mg BID as an outpatient for DM control.  Will plan to follow up with patient while inpatient after surgery and when appropriate. Please be sure Inpatient Diabetes Coordinator is consulted again when patient is admitted.   Thanks, Barnie Alderman, RN, MSN, CDE Diabetes Coordinator Inpatient Diabetes Program [phone] (Team Pager from 8am to 5pm)

## 2019-09-08 NOTE — Progress Notes (Signed)
   09/08/19 1046  OBSTRUCTIVE SLEEP APNEA  Have you ever been diagnosed with sleep apnea through a sleep study? No  Do you snore loudly (loud enough to be heard through closed doors)?  1  Do you often feel tired, fatigued, or sleepy during the daytime (such as falling asleep during driving or talking to someone)? 0  Has anyone observed you stop breathing during your sleep? 0  Do you have, or are you being treated for high blood pressure? 1  BMI more than 35 kg/m2? 1  Age > 50 (1-yes) 1  Neck circumference greater than:Male 16 inches or larger, Male 17inches or larger? 0  Male Gender (Yes=1) 1  Obstructive Sleep Apnea Score 5  Score 5 or greater  Results sent to PCP

## 2019-09-08 NOTE — Progress Notes (Addendum)
PCP - Dr. Nelda Bucks Cardiologist - Dr. Geraldo Pitter   Chest x-ray - 09/08/19 EKG - 08/24/19 Stress Test - 08/05/19 ECHO - 08/19/19 Cardiac Cath - 08/28/19  Sleep Study - denies; + stop bang, note sent to PCP   Fasting Blood Sugar - 150-180 Checks Blood Sugar once daily  CBG at PAT 262. DM Consult order placed  Blood Thinner Instructions: hold Xarelto 4 days prior to surgery. Last dose 09/05/19 Aspirin Instructions: Patient instructed to hold all NSAID's, herbal medications, fish oil and vitamins 7 days prior to surgery.   COVID TEST- 09/08/19- results pending   Anesthesia review: cardiac history; A1c 9.1  Patient denies shortness of breath, fever, cough and chest pain at PAT appointment   All instructions explained to the patient, with a verbal understanding of the material. Patient agrees to go over the instructions while at home for a better understanding. Patient also instructed to self quarantine after being tested for COVID-19. The opportunity to ask questions was provided.

## 2019-09-08 NOTE — Progress Notes (Signed)
Pre cabg has been completed.   Preliminary results in CV Proc.   Ryan Vaughan 09/08/2019 10:33 AM

## 2019-09-09 ENCOUNTER — Encounter (HOSPITAL_COMMUNITY): Payer: Self-pay

## 2019-09-09 MED ORDER — TRANEXAMIC ACID 1000 MG/10ML IV SOLN
1.5000 mg/kg/h | INTRAVENOUS | Status: AC
  Administered 2019-09-10: 1.5 mg/kg/h via INTRAVENOUS
  Filled 2019-09-09: qty 25

## 2019-09-09 MED ORDER — SODIUM CHLORIDE 0.9 % IV SOLN
750.0000 mg | INTRAVENOUS | Status: AC
  Administered 2019-09-10: 750 mg via INTRAVENOUS
  Filled 2019-09-09: qty 750

## 2019-09-09 MED ORDER — MILRINONE LACTATE IN DEXTROSE 20-5 MG/100ML-% IV SOLN
0.3000 ug/kg/min | INTRAVENOUS | Status: DC
  Filled 2019-09-09: qty 100

## 2019-09-09 MED ORDER — INSULIN REGULAR(HUMAN) IN NACL 100-0.9 UT/100ML-% IV SOLN
INTRAVENOUS | Status: AC
  Administered 2019-09-10: 10.5 [IU]/h via INTRAVENOUS
  Filled 2019-09-09: qty 100

## 2019-09-09 MED ORDER — TRANEXAMIC ACID (OHS) BOLUS VIA INFUSION
15.0000 mg/kg | INTRAVENOUS | Status: AC
  Administered 2019-09-10: 1983 mg via INTRAVENOUS
  Filled 2019-09-09: qty 1983

## 2019-09-09 MED ORDER — VANCOMYCIN HCL 10 G IV SOLR
1500.0000 mg | INTRAVENOUS | Status: AC
  Administered 2019-09-10: 1500 mg via INTRAVENOUS
  Filled 2019-09-09: qty 1500

## 2019-09-09 MED ORDER — DEXMEDETOMIDINE HCL IN NACL 400 MCG/100ML IV SOLN
0.1000 ug/kg/h | INTRAVENOUS | Status: AC
  Administered 2019-09-10: .3 ug/kg/h via INTRAVENOUS
  Filled 2019-09-09: qty 100

## 2019-09-09 MED ORDER — NITROGLYCERIN IN D5W 200-5 MCG/ML-% IV SOLN
2.0000 ug/min | INTRAVENOUS | Status: DC
  Filled 2019-09-09: qty 250

## 2019-09-09 MED ORDER — PLASMA-LYTE 148 IV SOLN
INTRAVENOUS | Status: DC
  Filled 2019-09-09: qty 2.5

## 2019-09-09 MED ORDER — NOREPINEPHRINE 4 MG/250ML-% IV SOLN
0.0000 ug/min | INTRAVENOUS | Status: DC
  Filled 2019-09-09: qty 250

## 2019-09-09 MED ORDER — TRANEXAMIC ACID (OHS) PUMP PRIME SOLUTION
2.0000 mg/kg | INTRAVENOUS | Status: DC
  Filled 2019-09-09: qty 2.64

## 2019-09-09 MED ORDER — EPINEPHRINE HCL 5 MG/250ML IV SOLN IN NS
0.0000 ug/min | INTRAVENOUS | Status: DC
  Filled 2019-09-09: qty 250

## 2019-09-09 MED ORDER — SODIUM CHLORIDE 0.9 % IV SOLN
INTRAVENOUS | Status: DC
  Filled 2019-09-09: qty 30

## 2019-09-09 MED ORDER — POTASSIUM CHLORIDE 2 MEQ/ML IV SOLN
80.0000 meq | INTRAVENOUS | Status: DC
  Filled 2019-09-09: qty 40

## 2019-09-09 MED ORDER — PHENYLEPHRINE HCL-NACL 20-0.9 MG/250ML-% IV SOLN
30.0000 ug/min | INTRAVENOUS | Status: AC
  Administered 2019-09-10 (×2): 20 ug/min via INTRAVENOUS
  Filled 2019-09-09: qty 250

## 2019-09-09 MED ORDER — SODIUM CHLORIDE 0.9 % IV SOLN
1.5000 g | INTRAVENOUS | Status: AC
  Administered 2019-09-10: 1.5 g via INTRAVENOUS
  Filled 2019-09-09: qty 1.5

## 2019-09-09 MED ORDER — MAGNESIUM SULFATE 50 % IJ SOLN
40.0000 meq | INTRAMUSCULAR | Status: DC
  Filled 2019-09-09: qty 9.85

## 2019-09-09 NOTE — H&P (Signed)
[address]       [place], [address]             [phone]       Cardiothoracic Surgery Admission History and Physical   PCP is Nicoletta Dress, MD  Referring Provider is Belva Crome, MD      Chief Complaint  Patient presents with   Coronary Artery Disease       HPI:  The patient is a 73 year old gentleman with a history of hypertension, dyslipidemia, diabetes, obesity, permanent atrial fibrillation on anticoagulation, and coronary disease who presents with recent onset of exertional shortness of breath. This is typically happen with walking up stairs quickly or doing any other type of exertion or he has to move quickly. It is relieved with rest. He has not had any chest pain or pressure. He denies lower extremity edema. He had a gated nuclear stress test on 08/05/2019 which showed an ejection fraction of 45%. There was a medium defect of moderate severity in the basal inferior and mid inferior walls consistent with ischemia. The patient did report dyspnea during the stress test but had no chest pain and no EKG changes. He was noted to have a blood pressure of 212/100 at rest and this increased to 222/97 during peak stress. An echocardiogram showed an ejection fraction of 55 to 60% with severe LVH. The right and left atrium were moderately dilated. There was no significant valvular disease. Catheterization on 08/28/2019 showed severe three-vessel coronary disease. The mid LAD had an eccentric 85% stenosis after the small first diagonal. The left circumflex had 60% mid vessel eccentric narrowing that did not appear hemodynamically significant. The right coronary artery had a 90 to 95% mid to distal stenosis before entering a fusiform aneurysm. There is a large posterior descending branch. The continuation of the right coronary artery was occluded. LVEDP was normal. Ejection fraction was 45 to 50%.   History reviewed. No pertinent past medical history.       Past  Surgical History:  Procedure Laterality Date   LEFT HEART CATH AND CORONARY ANGIOGRAPHY N/A 08/28/2019   Procedure: LEFT HEART CATH AND CORONARY ANGIOGRAPHY; Surgeon: Belva Crome, MD; Location: [facility]; Service: Cardiovascular; Laterality: N/A;   History reviewed. No pertinent family history.  Social History  Social History       Tobacco Use   Smoking status: Never Smoker   Smokeless tobacco: Never Used  Substance Use Topics   Alcohol use: No   Drug use: No         Current Outpatient Medications  Medication Sig Dispense Refill   acetaminophen (TYLENOL) 500 MG tablet Take 1,000-1,500 mg by mouth every 8 (eight) hours as needed for moderate pain or headache.     amLODipine (NORVASC) 5 MG tablet Take 5 mg by mouth daily in the afternoon.      aspirin EC 81 MG tablet Take 81 mg by mouth daily.     atorvastatin (LIPITOR) 80 MG tablet TAKE 1 TABLET BY MOUTH ONCE DAILY (Patient taking differently: Take 80 mg by mouth at bedtime. ) 90 tablet 0   carvedilol (COREG) 12.5 MG tablet TAKE ONE (1) TABLET BY MOUTH TWO (2) TIMES DAILY (Patient taking differently: Take 12.5 mg by mouth 2 (two) times daily. ) 180 tablet 0   DIGOX 250 MCG tablet TAKE ONE TABLET BY MOUTH ONCE DAILY (Patient taking differently: Take 0.25 mg by mouth at bedtime. ) 90 tablet  1   gabapentin (NEURONTIN) 800 MG tablet Take 800 mg by mouth See admin instructions. Take 800 mg at night may take a second dose during the day as needed for pain     glimepiride (AMARYL) 4 MG tablet Take 4 mg by mouth at bedtime.      hydrALAZINE (APRESOLINE) 10 MG tablet TAKE 1 TABLET BY MOUTH DAILY (Patient taking differently: Take 10 mg by mouth 2 (two) times daily. ) 90 tablet 1   losartan (COZAAR) 100 MG tablet Take 100 mg by mouth at bedtime.      metFORMIN (GLUCOPHAGE) 1000 MG tablet Take 1,000 mg by mouth 2 (two) times daily with a meal.     nitroGLYCERIN (NITROSTAT) 0.4 MG SL tablet Place 1 tablet (0.4 mg total)  under the tongue every 5 (five) minutes as needed for chest pain. 30 tablet 4   rivaroxaban (XARELTO) 20 MG TABS tablet Take 1 tablet (20 mg total) by mouth daily. (Patient taking differently: Take 20 mg by mouth at bedtime. ) 180 tablet 3   Vitamin D, Ergocalciferol, (DRISDOL) 1.25 MG (50000 UT) CAPS capsule Take 50,000 Units by mouth every Friday.      No current facility-administered medications for this visit.         Allergies  Allergen Reactions   Penicillins Rash    Did it involve swelling of the face/tongue/throat, SOB, or low BP? No  Did it involve sudden or severe rash/hives, skin peeling, or any reaction on the inside of your mouth or nose? No  Did you need to seek medical attention at a hospital or doctor's office? No  When did it last happen? 2-3 years  If all above answers are NO, may proceed with cephalosporin use.    Review of Systems  Constitutional: Positive for activity change and fatigue.  HENT: Positive for hearing loss.  Eyes: Negative.  Respiratory: Positive for shortness of breath. Negative for chest tightness.  Cardiovascular: Positive for palpitations. Negative for chest pain and leg swelling.  Gastrointestinal: Negative.  Endocrine: Negative.  Genitourinary: Positive for frequency.  Musculoskeletal: Positive for arthralgias and joint swelling.  Skin: Negative.  Allergic/Immunologic: Negative.  Neurological:  Hx of TIA  Hematological: Negative.  Psychiatric/Behavioral: Negative.   BP (!) 210/130 (BP Location: Right Arm, Patient Position: Sitting, Cuff Size: Large) Comment: manual check   Pulse 87   Temp 97.7 F (36.5 C) (Skin)   Resp 20   Ht 6\' 4"  (1.93 m)   Wt 290 lb (131.5 kg)   SpO2 97% Comment: RA   BMI 35.30 kg/m  Physical Exam  Constitutional:  Appearance: He is obese.  HENT:  Head: Normocephalic and atraumatic.  Eyes:  Extraocular Movements: Extraocular movements intact.  Pupils: Pupils are equal, round, and reactive to light.  Neck:    Musculoskeletal: Neck supple.  Vascular: No carotid bruit.  Cardiovascular:  Rate and Rhythm: Normal rate. Rhythm irregular.  Pulses: Normal pulses.  Heart sounds: Normal heart sounds. No murmur.  Pulmonary:  Effort: Pulmonary effort is normal.  Breath sounds: Normal breath sounds.  Abdominal:  Tenderness: There is no abdominal tenderness.  Musculoskeletal: Normal range of motion.  General: No swelling.  Lymphadenopathy:  Cervical: No cervical adenopathy.  Skin:  General: Skin is warm and dry.  Neurological:  General: No focal deficit present.  Mental Status: He is alert and oriented to person, place, and time.  Psychiatric:  Mood and Affect: Mood normal.  Behavior: Behavior normal.   Diagnostic Tests:  ECHOCARDIOGRAM REPORT  Patient Name: Ryan Vaughan Date of Exam: 08/19/2019  Medical Rec #: [id number] Height: 76.0 in  Accession #: [id number] Weight: 293.0 lb  Date of Birth: 1946-07-20 BSA: 2.61 m  Patient Age: 53 years BP: 136/86 mmHg  Patient Gender: M HR: 72 bpm.  Exam Location: [facility]  Procedure: 2D Echo  Indications: Dyspnea on exertion [R06.00] - Primary  History: Patient has no prior history of Echocardiogram examinations and  Patient has prior history of Echocardiogram examinations, most  recent 08/06/2017. Carotid Disease, Arrythmias:Atrial  Fibrillation; Risk Factors:Hypertension and Diabetes.  Sonographer: Luane School  Referring Phys: Orick  1. Left ventricular ejection fraction, by visual estimation, is 55 to 60%. The left ventricle has normal function. Left ventricular septal wall thickness was severely increased. Severely increased left ventricular posterior wall thickness. There is  severely increased left ventricular hypertrophy.  2. Moderately dilated left ventricular internal cavity size.  3. Global right ventricle has normal systolic function.The right ventricular size is moderately enlarged. No increase in right  ventricular wall thickness.  4. Left atrial size was moderately to severelydilated.  5. Right atrial size was moderately dilated.  6. The mitral valve is normal in structure. No evidence of mitral valve regurgitation. No evidence of mitral stenosis.  7. The tricuspid valve is normal in structure. Tricuspid valve regurgitation is mild.  8. The aortic valve is tricuspid. Aortic valve regurgitation is not visualized. Mild to moderate aortic valve sclerosis/calcification without any evidence of aortic stenosis.  9. The pulmonic valve was normal in structure. Pulmonic valve regurgitation is not visualized.  10. 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  11. Normal pulmonary artery systolic pressure.  12. The inferior vena cava is normal in size with greater than 50% respiratory variability, suggesting right atrial pressure of 3 mmHg.  13. Left ventricular diastolic parameters are indeterminate.  14. Moderate mitral annular calcification.  FINDINGS  Left Ventricle: Left ventricular ejection fraction, by visual estimation, is 55 to 60%. The left ventricle has normal function. The left ventricular internal cavity size was moderately dilated left ventricle. Severely increased left ventricular  posterior wall thickness. There is severely increased left ventricular hypertrophy. Concentric left ventricular hypertrophy. Left ventricular diastolic parameters are indeterminate.  Right Ventricle: The right ventricular size is moderately enlarged. No increase in right ventricular wall thickness. Global RV systolic function is has normal systolic function. The tricuspid regurgitant velocity is 2.05 m/s, and with an assumed right  atrial pressure of 3 mmHg, the estimated right ventricular systolic pressure is normal at 19.9 mmHg.  Left Atrium: Left atrial size was moderately to severely dilated.  Right Atrium: Right atrial size was moderately dilated  Pericardium: There is no evidence of  pericardial effusion.  Mitral Valve: The mitral valve is normal in structure. Moderate mitral annular calcification. No evidence of mitral valve stenosis by observation. No evidence of mitral valve regurgitation.  Tricuspid Valve: The tricuspid valve is normal in structure. Tricuspid valve regurgitation is mild.  Aortic Valve: The aortic valve is tricuspid. Aortic valve regurgitation is not visualized. Mild to moderate aortic valve sclerosis/calcification is present, without any evidence of aortic stenosis.  Pulmonic Valve: The pulmonic valve was normal in structure. Pulmonic valve regurgitation is not visualized. No evidence of pulmonic stenosis.  Aorta: The aortic root is normal in size and structure. 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  Venous: The pulmonary veins were not well visualized. The inferior vena cava  is normal in size with greater than 50% respiratory variability, suggesting right atrial pressure of 3 mmHg.  IAS/Shunts: No atrial level shunt detected by color flow Doppler. No ventricular septal defect is seen or detected. There is no evidence of an atrial septal defect.  LEFT VENTRICLE  PLAX 2D  LVIDd: 6.40 cm Diastology  LVIDs: 4.30 cm LV e' lateral: 6.42 cm/s  LV PW: 1.60 cm LV E/e' lateral: 15.3  LV IVS: 1.70 cm LV e' medial: 3.15 cm/s  LV SV: 125 ml LV E/e' medial: 31.3  LV SV Index: 46.29  LV Volumes (MOD)  LV area d, A2C: 24.90 cm  LV area d, A4C: 30.90 cm  LV area s, A2C: 16.50 cm  LV area s, A4C: 19.10 cm  LV major d, A2C: 8.29 cm  LV major d, A4C: 6.97 cm  LV major s, A2C: 7.62 cm  LV major s, A4C: 6.31 cm  LV vol d, MOD A2C: 64.9 ml  LV vol d, MOD A4C: 112.4 ml  LV vol s, MOD A2C: 31.1 ml  LV vol s, MOD A4C: 47.7 ml  LV SV MOD A2C: 33.8 ml  LV SV MOD A4C: 112.4 ml  LV SV MOD BP: 50.0 ml  RIGHT VENTRICLE IVC  RV S prime: 10.20 cm/s IVC diam: 1.90 cm  TAPSE (M-mode): 2.2 cm  LEFT ATRIUM Index RIGHT ATRIUM Index  LA diam:  5.00 cm 1.92 cm/m RA Area: 28.40 cm  LA Vol (A2C): 127.0 ml 48.74 ml/m RA Volume: 104.00 ml 39.91 ml/m  LA Vol (A4C): 110.0 ml 42.22 ml/m  LA Biplane Vol: 124.0 ml 47.59 ml/m  AORTIC VALVE  LVOT Vmax: 72.40 cm/s  LVOT Vmean: 45.800 cm/s  LVOT VTI: 0.124 m  AORTA  Ao Root diam: 4.10 cm  Ao Asc diam: 4.10 cm  MITRAL VALVE TRICUSPID VALVE  MV Area (PHT): 4.10 cm TR Peak grad: 16.9 mmHg  MV PHT: 53.65 msec TR Vmax: 219.00 cm/s  MV Decel Time: 185 msec  MV E velocity: 98.50 cm/s 103 cm/s SHUNTS  MV A velocity: 1.46 cm/s 70.3 cm/s Systemic VTI: 0.12 m  MV E/A ratio: 67.47 1.5  Shirlee More MD  Electronically signed by Shirlee More MD  Signature Date/Time: 08/19/2019/4:21:58 PM  Physicians  Panel Physicians Referring Physician Case Authorizing Physician  Belva Crome, MD (Primary)    Procedures  LEFT HEART CATH AND CORONARY ANGIOGRAPHY  Conclusion  Widely patent left main coronary artery  Mid LAD contains eccentric 85% stenosis after the first diagonal. The first diagonal is small and contains ostial 90% stenosis.  A large ramus intermedius arises from the distal left main but is free of significant obstruction.  Circumflex gives origin to a small first obtuse marginal, in the mid vessel there is eccentric 60 to 70% narrowing. 2 smaller obtuse marginals arise distally and supplies collaterals to a large right coronary left ventricular branch which apparently has been totally occluded for greater than 10 years.  The right coronary artery is large. It is totally occluded in the continuation after a small left ventricular branch arises. The distal vessel is supplied by collaterals from left circumflex to the right coronary. The mid to distal right coronary contains 90 to 95% eccentric angulated stenosis that enters a fusiform aneurysm =/> 4.5 mm in diameter). The native vessel pre and post aneurysm is 3.5 mm. The PDA is large and graftable.  Overall normal left ventricular function  with EF 45 to 50%. Normal LVEDP. RECOMMENDATIONS:  TCTS consultation  for consideration of multivessel grafting.  Significant blood pressure elevation was present this morning because no antihypertensive therapy was taken in the past 24 hours. IV labetalol was given to improve blood pressure.  He should take his blood pressure medication as soon as he arrives home.  Xarelto should be resumed later this evening or in a.m. depending upon when he ordinarily takes it. He should not take the Xarelto before he has had antihypertensive therapy.  Recommendations  Antiplatelet/Anticoag Recommend to resume Rivaroxaban, at currently prescribed dose and frequency on 08/29/2019. Recommend concurrent antiplatelet therapy of Aspirin 81 mg daily.  Surgeon Notes    08/28/2019 9:09 AM CV Procedure signed by Belva Crome, MD  Indications  Dyspnea on exertion [R06.00 (ICD-10-CM)]  Abnormal nuclear stress test [R94.39 (ICD-10-CM)]  Coronary artery disease involving native coronary artery of native heart with angina pectoris ([place]) [I25.119 (ICD-10-CM)]  Procedural Details  Technical Details The right radial area was sterilely prepped and draped. Intravenous sedation with Versed and fentanyl was administered. 1% Xylocaine was infiltrated to achieve local analgesia. Using real-time vascular ultrasound, a double wall stick with an angiocath was utilized to obtain intra-arterial access. A VUS image was saved for the record.The modified Seldinger technique was used to place a 38F " Slender" sheath in the right radial artery. Weight based heparin was administered. Coronary angiography was done using 5 F catheters. Right coronary angiography was performed with a JR4. Left ventricular hemodymic recordings and angiography was done using the JR 4 catheter and hand injection. Left coronary angiography was performed with a JL 3.5 cm.  Hemostasis was achieved using a pneumatic band.  During this procedure the patient is  administered a total of Versed 2 mg and Fentanyl 25 mcg to achieve and maintain moderate conscious sedation. The patient's heart rate, blood pressure, and oxygen saturation are monitored continuously during the procedure. The period of conscious sedation is 26 minutes, of which I was present face-to-face 100% of this time. Estimated blood loss <50 mL.   During this procedure medications were administered to achieve and maintain moderate conscious sedation while the patient's heart rate, blood pressure, and oxygen saturation were continuously monitored and I was present face-to-face 100% of this time.  Medications  (Filter: Administrations occurring from 08/28/19 0751 to 08/28/19 0905)          (important) Continuous medications are totaled by the amount administered until 08/28/19 0905.  Medication Rate/Dose/Volume Action  Date Time   midazolam (VERSED) injection (mg) 1 mg Given 08/28/19 0819   Total dose as of 08/28/19 0905 1 mg Given 0830   2 mg        fentaNYL (SUBLIMAZE) injection (mcg) 25 mcg Given 08/28/19 0819   Total dose as of 08/28/19 0905        25 mcg        labetalol (NORMODYNE) injection (mg) 10 mg Given 08/28/19 0821   Total dose as of 08/28/19 0905 10 mg Given 0859   20 mg        lidocaine (PF) (XYLOCAINE) 1 % injection (mL) 2 mL Given 08/28/19 0828   Total dose as of 08/28/19 0905        2 mL        Radial Cocktail/Verapamil only (mL) 10 mL Given 08/28/19 0830   Total dose as of 08/28/19 0905        10 mL        heparin injection (Units) 6,000 Units Given 08/28/19 0831   Total dose  as of 08/28/19 0905        6,000 Units        iohexol (OMNIPAQUE) 350 MG/ML injection (mL) 90 mL Given 08/28/19 0849   Total dose as of 08/28/19 0905        90 mL        Heparin (Porcine) in NaCl 1000-0.9 UT/500ML-% SOLN (mL) 500 mL Given 08/28/19 0850   Total dose as of 08/28/19 0905 500 mL Given 0850   1,000 mL        Sedation Time  Sedation Time Physician-1: 25 minutes 45 seconds    Contrast  Medication Name Total Dose  iohexol (OMNIPAQUE) 350 MG/ML injection 90 mL  Radiation/Fluoro  Fluoro time: 2.3 (min)  DAP: [id number] (mGycm2)  Cumulative Air Kerma: 446 (mGy)  Coronary Findings  Diagnostic  Dominance: Right  Left Anterior Descending  Mid LAD lesion 80% stenosed  Mid LAD lesion is 80% stenosed.  First Diagonal Branch  Vessel is small in size.  1st Diag lesion 80% stenosed  1st Diag lesion is 80% stenosed.  Left Circumflex  Prox Cx to Mid Cx lesion 65% stenosed  Prox Cx to Mid Cx lesion is 65% stenosed.  First Obtuse Marginal Branch  Vessel is small in size.  Right Coronary Artery  Prox RCA to Mid RCA lesion 55% stenosed  Prox RCA to Mid RCA lesion is 55% stenosed.  Mid RCA lesion 95% stenosed  Mid RCA lesion is 95% stenosed.  Mid RCA to Dist RCA lesion 0% stenosed  Non-stenotic Mid RCA to Dist RCA lesion.  Right Posterior Atrioventricular Artery  RPAV lesion 100% stenosed  RPAV lesion is 100% stenosed.  Third Right Posterolateral Branch  Vessel is large in size.  Collaterals  3rd RPL filled by collaterals from 3rd Mrg.    Intervention  No interventions have been documented.  Wall Motion        Resting         All segments of the heart are normal.        Left Heart  Left Ventricle The left ventricular size is normal. The left ventricular systolic function is normal. LV end diastolic pressure is normal. The left ventricular ejection fraction is 45-50% by visual estimate.  Coronary Diagrams  Diagnostic  Dominance: Right   Intervention  Implants     No implant documentation for this case.  Syngo Images  Link to Procedure Log   Show images for CARDIAC CATHETERIZATION Procedure Log  Images on Long Term Storage    Show images for Ryan Vaughan, Ryan Vaughan   [facility] Data   Most Recent Value  AO Systolic Pressure 99991111 mmHg  AO Diastolic Pressure 94 mmHg  AO Mean [id number] mmHg  LV Systolic Pressure 99991111 mmHg  LV Diastolic Pressure 4 mmHg  LV EDP 11 mmHg   AOp Systolic Pressure A999333 mmHg  AOp Diastolic Pressure 88 mmHg  AOp Mean Pressure 123XX123 mmHg  LVp Systolic Pressure Q000111Q mmHg  LVp Diastolic Pressure 5 mmHg  LVp EDP Pressure 11 mmHg   Impression:   This 73 year old gentleman has severe three-vessel coronary disease with recent onset of exertional shortness of breath and fatigue without chest pain. He had an abnormal nuclear stress test showing inferior ischemia. Cardiac catheterization shows a 95% mid to distal right coronary stenosis supplying a large posterior descending branch. The continuation of the right coronary artery is occluded with filling of the distal vessel by collaterals from the left circumflex. Left circumflex has moderate nonobstructive stenosis. The  LAD has 80% mid vessel stenosis. I agree that coronary artery bypass graft surgery is the best option for this diabetic patient. He has permanent atrial fibrillation with large right and left atria. I would plan to place a clip on his left atrial appendage. I reviewed the cardiac catheterization films with him and his daughter and answered all of their questions. I discussed the operative procedure with the patient and family including alternatives, benefits and risks; including but not limited to bleeding, blood transfusion, infection, stroke, myocardial infarction, graft failure, heart block requiring a permanent pacemaker, organ dysfunction, and death. Ryan Vaughan understands and agrees to proceed. He would like to go home and discuss with his wife before scheduling surgery.   Plan:   Coronary bypass graft surgery and clipping of the left atrial appendage.

## 2019-09-09 NOTE — Anesthesia Preprocedure Evaluation (Addendum)
Anesthesia Evaluation  Patient identified by MRN, date of birth, ID band Patient awake    Reviewed: Allergy & Precautions, NPO status , Patient's Chart, lab work & pertinent test results, reviewed documented beta blocker date and time   Airway Mallampati: III  TM Distance: >3 FB Neck ROM: Full    Dental  (+)    Pulmonary neg pulmonary ROS,    Pulmonary exam normal breath sounds clear to auscultation       Cardiovascular hypertension, Pt. on medications and Pt. on home beta blockers + CAD  Normal cardiovascular exam+ dysrhythmias Atrial Fibrillation  Rhythm:Regular Rate:Normal  CATH: Widely patent left main coronary artery Mid LAD contains eccentric 85% stenosis after the first diagonal.  The first diagonal is small and contains ostial 90% stenosis. A large ramus intermedius arises from the distal left main but is free of significant obstruction. Circumflex gives origin to a small first obtuse marginal, in the mid vessel there is eccentric 60 to 70% narrowing.  2 smaller obtuse marginals arise distally and supplies collaterals to a large right coronary left ventricular branch which apparently has been totally occluded for greater than 10 years. The right coronary artery is large.  It is totally occluded in the continuation after a small left ventricular branch arises.  The distal vessel is supplied by collaterals from left circumflex to the right coronary.  The mid to distal right coronary contains 90 to 95% eccentric angulated stenosis that enters a fusiform aneurysm =/> 4.5 mm in diameter).  The native vessel pre and post aneurysm is 3.5 mm.  The PDA is large and graftable. Overall normal left ventricular function with EF 45 to 50%.  Normal LVEDP.  ECHO:   1. Left ventricular ejection fraction, by visual estimation, is 55 to 60%. The left ventricle has normal function. Left ventricular septal wall thickness was severely increased.  Severely increased left ventricular posterior wall thickness. There is  severely increased left ventricular hypertrophy.  2. Moderately dilated left ventricular internal cavity size.  3. Global right ventricle has normal systolic function.The right ventricular size is moderately enlarged. No increase in right ventricular wall thickness.  4. Left atrial size was moderately to severelydilated.  5. Right atrial size was moderately dilated.  6. The mitral valve is normal in structure. No evidence of mitral valve regurgitation. No evidence of mitral stenosis.  7. The tricuspid valve is normal in structure. Tricuspid valve regurgitation is mild.  8. The aortic valve is tricuspid. Aortic valve regurgitation is not visualized. Mild to moderate aortic valve sclerosis/calcification without any evidence of aortic stenosis.  9. The pulmonic valve was normal in structure. Pulmonic valve regurgitation is not visualized. 10. 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 11. Normal pulmonary artery systolic pressure. 12. The inferior vena cava is normal in size with greater than 50% respiratory variability, suggesting right atrial pressure of 3 mmHg. 13. Left ventricular diastolic parameters are indeterminate. 14. Moderate mitral annular calcification.   Neuro/Psych TIAnegative psych ROS   GI/Hepatic negative GI ROS, Neg liver ROS,   Endo/Other  diabetes, Oral Hypoglycemic Agents  Renal/GU negative Renal ROS     Musculoskeletal negative musculoskeletal ROS (+)   Abdominal (+) + obese,   Peds  Hematology HLD   Anesthesia Other Findings CAD  AFIB  Reproductive/Obstetrics                          Anesthesia Physical Anesthesia Plan  ASA:  IV  Anesthesia Plan: General   Post-op Pain Management:    Induction: Intravenous  PONV Risk Score and Plan: 2 and Ondansetron, Dexamethasone, Midazolam and Treatment may vary due to age or medical  condition  Airway Management Planned: Oral ETT  Additional Equipment: Arterial line, CVP, TEE, PA Cath and Ultrasound Guidance Line Placement  Intra-op Plan:   Post-operative Plan: Post-operative intubation/ventilation  Informed Consent: I have reviewed the patients History and Physical, chart, labs and discussed the procedure including the risks, benefits and alternatives for the proposed anesthesia with the patient or authorized representative who has indicated his/her understanding and acceptance.     Dental advisory given  Plan Discussed with: CRNA  Anesthesia Plan Comments: (Reviewed PAT note written 09/09/2019 by Myra Gianotti, PA-C. )      Anesthesia Quick Evaluation

## 2019-09-09 NOTE — Progress Notes (Signed)
Anesthesia Chart Review:  Case: [id number] Date/Time: 09/10/19 0715   Procedures:      CORONARY ARTERY BYPASS GRAFTING (CABG) (N/A Chest)     CLIPPING OF ATRIAL APPENDAGE (N/A )     TRANSESOPHAGEAL ECHOCARDIOGRAM (TEE) (N/A )   Anesthesia type: General   Pre-op diagnosis:      CAD     AFIB   Location: [facility] OR ROOM 17 / [place] [facility]   Surgeon: Gaye Pollack, MD      DISCUSSION: Patient is a 73 year old male scheduled for the above procedure.  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  BMI is consistent with obesity.  OSA screening score 5.  09/08/2019 COVID-19 test negative.  Anesthesia team to evaluate on the day of surgery.  VS: BP (!) 174/91   Pulse 98   Temp 36.8 C (Oral)   Resp 19   Ht 6\' 4"  (1.93 m)   Wt 132.2 kg   SpO2 96%   BMI 35.47 kg/m     PROVIDERS: Nicoletta Dress, MD his PCP Jyl Heinz, MD his cardiologist Benedetto Goad, MD is vascular surgeon. Last visit 05/24/17 ([place]). Carotid US without significant stenosis. Renal US showed right renal artery occlusion, as well as, the right renal artery aneurysm. He had stable > 60% left RA stenosis. He recommended continued surveillance of carotid artery disease but did "not plan any further renal artery duplex studies, unless the patient develops accelerated hypertension."   LABS: Preoperative labs noted. A1c 9.1% (ordered by surgeon). DM Coordinator notified and will plan to follow-up with patient during hospitalization.  (all labs ordered are listed, but only abnormal results are displayed)  Labs Reviewed  GLUCOSE, CAPILLARY - Abnormal; Notable for the following components:      Result Value   Glucose-Capillary 262 (*)    All other components within normal  limits  CBC - Abnormal; Notable for the following components:   Platelets 145 (*)    All other components within normal limits  COMPREHENSIVE METABOLIC PANEL - Abnormal; Notable for the following components:   CO2 19 (*)    Glucose, Bld 312 (*)    All other components within normal limits  HEMOGLOBIN A1C - Abnormal; Notable for the following components:   Hgb A1c MFr Bld 9.1 (*)    All other components within normal limits  URINALYSIS, ROUTINE W REFLEX MICROSCOPIC - Abnormal; Notable for the following components:   Color, Urine AMBER (*)    APPearance HAZY (*)    Glucose, UA >=500 (*)    Protein, ur 30 (*)    Bacteria, UA RARE (*)    All other components within normal limits  SURGICAL PCR SCREEN  APTT  BLOOD GAS, ARTERIAL  PROTIME-INR  TYPE AND SCREEN  ABO/RH     IMAGES: CXR 09/08/19: IMPRESSION: 1.  No radiographic evidence of acute cardiopulmonary disease. 2. Aortic atherosclerosis.   EKG: 08/24/19: Atrial fibrillation at 77 bpm Left axis deviation Incomplete right bundle branch block Anteroseptal infarct, age undetermined   CV: Carotid US 09/08/19: Summary: - Right Carotid: Velocities in the right ICA are consistent with a 1-39% stenosis. - Left Carotid: Velocities in the left ICA are consistent with a 1-39% stenosis. - Vertebrals: Bilateral vertebral arteries demonstrate antegrade flow.   Cardiac cath  08/28/19:  Widely patent left main coronary artery  Mid LAD contains eccentric 85% stenosis after the first diagonal.  The first diagonal is small and contains ostial 90% stenosis.  A large ramus intermedius arises from the distal left main but is free of significant obstruction.  Circumflex gives origin to a small first obtuse marginal, in the mid vessel there is eccentric 60 to 70% narrowing.  2 smaller obtuse marginals arise distally and supplies collaterals to a large right coronary left ventricular branch which apparently has been totally occluded for  greater than 10 years.  The right coronary artery is large.  It is totally occluded in the continuation after a small left ventricular branch arises.  The distal vessel is supplied by collaterals from left circumflex to the right coronary.  The mid to distal right coronary contains 90 to 95% eccentric angulated stenosis that enters a fusiform aneurysm =/> 4.5 mm in diameter).  The native vessel pre and post aneurysm is 3.5 mm.  The PDA is large and graftable.  Overall normal left ventricular function with EF 45 to 50%.  Normal LVEDP. RECOMMENDATIONS:  TCTS consultation for consideration of multivessel grafting.   Echo 08/19/19: IMPRESSIONS  1. Left ventricular ejection fraction, by visual estimation, is 55 to 60%. The left ventricle has normal function. Left ventricular septal wall thickness was severely increased. Severely increased left ventricular posterior wall thickness. There is  severely increased left ventricular hypertrophy.  2. Moderately dilated left ventricular internal cavity size.  3. Global right ventricle has normal systolic function.The right ventricular size is moderately enlarged. No increase in right ventricular wall thickness.  4. Left atrial size was moderately to severelydilated.  5. Right atrial size was moderately dilated.  6. The mitral valve is normal in structure. No evidence of mitral valve regurgitation. No evidence of mitral stenosis.  7. The tricuspid valve is normal in structure. Tricuspid valve regurgitation is mild.  8. The aortic valve is tricuspid. Aortic valve regurgitation is not visualized. Mild to moderate aortic valve sclerosis/calcification without any evidence of aortic stenosis.  9. The pulmonic valve was normal in structure. Pulmonic valve regurgitation is not visualized. 10. 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 11. Normal pulmonary artery systolic pressure. 12. The inferior vena cava is normal in size with greater  than 50% respiratory variability, suggesting right atrial pressure of 3 mmHg. 13. Left ventricular diastolic parameters are indeterminate. 14. Moderate mitral annular calcification.   Cardiac event monitor 07/15/19-07/18/19: Baseline rhythm: Atrial fibrillation Minimum heart rate: 34 BPM.  Average heart rate: 68 BPM.  Maximal heart rate 155 BPM. Atrial arrhythmia: Patient remained in atrial fibrillation throughout the monitoring Ventricular arrhythmia: Few PVCs.  One 7 beat nonsustained ventricular tachycardia. Conduction abnormality: None significant Symptoms: None significant Conclusion:  Abnormal event monitor.  Patient remained in atrial fibrillation with fairly controlled ventricular rates.  One 7 beat nonsustained ventricular tachycardia as mentioned above.   Renal artery Duplex US 05/02/17 ([facility]): - Previous: Previous exam performed on 04/20/16 demonstrated greater than 60% bilateral renal artery stenoses. - Right: No flow is identified in the renal artery at the origin, proximal or distal segment.  There is a patent artery noted in the region of the mid renal artery which could represent a collateral vessel.  Minimal color fill is noted in the parenchyma,  suggesting possible occlusion of the renal artery, however no cortical thinning is appreciated.  There is a 1.5 cm x 1.4 cm calcified structure also  identified at the hilum which most likely represents the renal artery aneurysm, with no flow identified. - Left: Focal increase in renal artery velocities with turbulent flow noted distally. Conclusions: Right: Probable occlusion of the renal artery, and renal artery aneurysm.  This is a new finding compared to previous exam.  Left:  Greater than 60% renal artery stenosis.  These results remain essentially unchanged compared to previous exam.   Past Medical History:  Diagnosis Date  . Atrial fibrillation ([place])   . Carotid artery disease ([place])   . Coronary artery disease   .  Diabetes mellitus without complication ([place])   . Hypertension   . 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[place])    05/02/17 Renal artery [place] ([facility], Dr. Normajean Baxter): no flow identified in right renal artery origin or in known right renal artery aneurysm, patent artery in the region of mid renal artery could represent a collateral vessel, stable > 60% left RA stenosis  . TIA (transient ischemic attack)    due to severe RICA stenosis, s/p right CEA 04/09/14    Past Surgical History:  Procedure Laterality Date  . CAROTID ENDARTERECTOMY Right 04/09/2014  . LEFT HEART CATH AND CORONARY ANGIOGRAPHY N/A 08/28/2019   Procedure: LEFT HEART CATH AND CORONARY ANGIOGRAPHY;  Surgeon: Belva Crome, MD;  Location: [facility];  Service: Cardiovascular;  Laterality: N/A;    MEDICATIONS: . acetaminophen (TYLENOL) 500 MG tablet  . amLODipine (NORVASC) 5 MG tablet  . aspirin EC 81 MG tablet  . atorvastatin (LIPITOR) 80 MG tablet  . carvedilol (COREG) 12.5 MG tablet  . digoxin (LANOXIN) 0.25 MG tablet  . gabapentin (NEURONTIN) 800 MG tablet  . glimepiride (AMARYL) 4 MG tablet  . hydrALAZINE (APRESOLINE) 10 MG tablet  . losartan (COZAAR) 100 MG tablet  . metFORMIN (GLUCOPHAGE) 1000 MG tablet  . nitroGLYCERIN (NITROSTAT) 0.4 MG SL tablet  . rivaroxaban (XARELTO) 20 MG TABS tablet  . Vitamin D, Ergocalciferol, (DRISDOL) 1.25 MG (50000 UT) CAPS capsule   No current facility-administered medications for this encounter.    Last dose of Xarelto 09/05/2019.  Myra Gianotti, PA-C Surgical Short Stay/Anesthesiology [facility] Phone [phone] [facility] Phone [phone] 09/09/2019 9:34 AM

## 2019-09-10 ENCOUNTER — Encounter (HOSPITAL_COMMUNITY): Payer: Self-pay | Admitting: Surgery

## 2019-09-10 ENCOUNTER — Ambulatory Visit (HOSPITAL_COMMUNITY): Payer: PPO

## 2019-09-10 ENCOUNTER — Inpatient Hospital Stay (HOSPITAL_COMMUNITY): Payer: PPO | Admitting: Certified Registered"

## 2019-09-10 ENCOUNTER — Inpatient Hospital Stay (HOSPITAL_COMMUNITY)
Admission: RE | Disposition: A | Discharge status: Home / Self Care | Payer: Self-pay | Source: Home / Self Care | Attending: Surgery

## 2019-09-10 ENCOUNTER — Inpatient Hospital Stay (HOSPITAL_COMMUNITY)
Admission: RE | Admit: 2019-09-10 | Discharge: 2019-09-15 | DRG: 236 | Disposition: A | Discharge status: Home / Self Care | Payer: PPO | Attending: Surgery | Admitting: Surgery

## 2019-09-10 ENCOUNTER — Other Ambulatory Visit: Payer: Self-pay

## 2019-09-10 ENCOUNTER — Inpatient Hospital Stay (HOSPITAL_COMMUNITY): Payer: PPO | Admitting: Vascular Surgery

## 2019-09-10 ENCOUNTER — Inpatient Hospital Stay (HOSPITAL_COMMUNITY): Payer: PPO

## 2019-09-10 DIAGNOSIS — Q250 Patent ductus arteriosus: Secondary | ICD-10-CM

## 2019-09-10 DIAGNOSIS — Z951 Presence of aortocoronary bypass graft: Secondary | ICD-10-CM

## 2019-09-10 DIAGNOSIS — I77810 Thoracic aortic ectasia: Secondary | ICD-10-CM | POA: Diagnosis present

## 2019-09-10 DIAGNOSIS — E785 Hyperlipidemia, unspecified: Secondary | ICD-10-CM | POA: Diagnosis present

## 2019-09-10 DIAGNOSIS — Z7984 Long term (current) use of oral hypoglycemic drugs: Secondary | ICD-10-CM

## 2019-09-10 DIAGNOSIS — I482 Chronic atrial fibrillation, unspecified: Secondary | ICD-10-CM | POA: Diagnosis present

## 2019-09-10 DIAGNOSIS — I440 Atrioventricular block, first degree: Secondary | ICD-10-CM | POA: Diagnosis present

## 2019-09-10 DIAGNOSIS — Z7901 Long term (current) use of anticoagulants: Secondary | ICD-10-CM | POA: Diagnosis not present

## 2019-09-10 DIAGNOSIS — G8929 Other chronic pain: Secondary | ICD-10-CM | POA: Diagnosis present

## 2019-09-10 DIAGNOSIS — I10 Essential (primary) hypertension: Secondary | ICD-10-CM | POA: Diagnosis present

## 2019-09-10 DIAGNOSIS — E669 Obesity, unspecified: Secondary | ICD-10-CM | POA: Diagnosis present

## 2019-09-10 DIAGNOSIS — Z79899 Other long term (current) drug therapy: Secondary | ICD-10-CM

## 2019-09-10 DIAGNOSIS — M170 Bilateral primary osteoarthritis of knee: Secondary | ICD-10-CM | POA: Diagnosis present

## 2019-09-10 DIAGNOSIS — Z880 Allergy status to penicillin: Secondary | ICD-10-CM | POA: Diagnosis not present

## 2019-09-10 DIAGNOSIS — Z6835 Body mass index (BMI) 35.0-35.9, adult: Secondary | ICD-10-CM

## 2019-09-10 DIAGNOSIS — I25119 Atherosclerotic heart disease of native coronary artery with unspecified angina pectoris: Principal | ICD-10-CM | POA: Diagnosis present

## 2019-09-10 DIAGNOSIS — Z20828 Contact with and (suspected) exposure to other viral communicable diseases: Secondary | ICD-10-CM | POA: Diagnosis present

## 2019-09-10 DIAGNOSIS — E1165 Type 2 diabetes mellitus with hyperglycemia: Secondary | ICD-10-CM | POA: Diagnosis present

## 2019-09-10 DIAGNOSIS — Z7982 Long term (current) use of aspirin: Secondary | ICD-10-CM

## 2019-09-10 DIAGNOSIS — R5383 Other fatigue: Secondary | ICD-10-CM | POA: Diagnosis present

## 2019-09-10 DIAGNOSIS — I2510 Atherosclerotic heart disease of native coronary artery without angina pectoris: Secondary | ICD-10-CM

## 2019-09-10 DIAGNOSIS — E8770 Fluid overload, unspecified: Secondary | ICD-10-CM | POA: Diagnosis not present

## 2019-09-10 DIAGNOSIS — M479 Spondylosis, unspecified: Secondary | ICD-10-CM | POA: Diagnosis present

## 2019-09-10 DIAGNOSIS — M549 Dorsalgia, unspecified: Secondary | ICD-10-CM | POA: Diagnosis not present

## 2019-09-10 DIAGNOSIS — R001 Bradycardia, unspecified: Secondary | ICD-10-CM | POA: Diagnosis not present

## 2019-09-10 DIAGNOSIS — Z09 Encounter for follow-up examination after completed treatment for conditions other than malignant neoplasm: Secondary | ICD-10-CM

## 2019-09-10 HISTORY — PX: CORONARY ARTERY BYPASS GRAFT: SHX141

## 2019-09-10 HISTORY — DX: Presence of aortocoronary bypass graft: Z95.1

## 2019-09-10 HISTORY — DX: Unspecified hearing loss, unspecified ear: H91.90

## 2019-09-10 HISTORY — PX: TEE WITHOUT CARDIOVERSION: SHX5443

## 2019-09-10 HISTORY — PX: CLIPPING OF ATRIAL APPENDAGE: SHX5773

## 2019-09-10 LAB — POCT I-STAT 7, (LYTES, BLD GAS, ICA,H+H)
Acid-Base Excess: 1 mmol/L (ref 0.0–2.0)
Acid-base deficit: 1 mmol/L (ref 0.0–2.0)
Acid-base deficit: 2 mmol/L (ref 0.0–2.0)
Bicarbonate: 27.1 mmol/L (ref 20.0–28.0)
Bicarbonate: 24.7 mmol/L (ref 20.0–28.0)
Bicarbonate: 25.6 mmol/L (ref 20.0–28.0)
Bicarbonate: 23 mmol/L (ref 20.0–28.0)
Calcium, Ion: 1.09 mmol/L — ABNORMAL LOW (ref 1.15–1.40)
Calcium, Ion: 1.19 mmol/L (ref 1.15–1.40)
Calcium, Ion: 1.21 mmol/L (ref 1.15–1.40)
Calcium, Ion: 1.14 mmol/L — ABNORMAL LOW (ref 1.15–1.40)
HCT: 30 % — ABNORMAL LOW (ref 39.0–52.0)
HCT: 31 % — ABNORMAL LOW (ref 39.0–52.0)
HCT: 34 % — ABNORMAL LOW (ref 39.0–52.0)
HCT: 34 % — ABNORMAL LOW (ref 39.0–52.0)
Hemoglobin: 10.2 g/dL — ABNORMAL LOW (ref 13.0–17.0)
Hemoglobin: 10.5 g/dL — ABNORMAL LOW (ref 13.0–17.0)
Hemoglobin: 11.6 g/dL — ABNORMAL LOW (ref 13.0–17.0)
Hemoglobin: 11.6 g/dL — ABNORMAL LOW (ref 13.0–17.0)
O2 Saturation: 100 %
O2 Saturation: 98 %
O2 Saturation: 98 %
O2 Saturation: 99 %
Patient temperature: 36.1
Patient temperature: 37
Potassium: 3.8 mmol/L (ref 3.5–5.1)
Potassium: 4.1 mmol/L (ref 3.5–5.1)
Potassium: 4 mmol/L (ref 3.5–5.1)
Potassium: 4 mmol/L (ref 3.5–5.1)
Sodium: 140 mmol/L (ref 135–145)
Sodium: 140 mmol/L (ref 135–145)
Sodium: 140 mmol/L (ref 135–145)
Sodium: 141 mmol/L (ref 135–145)
TCO2: 29 mmol/L (ref 22–32)
TCO2: 26 mmol/L (ref 22–32)
TCO2: 27 mmol/L (ref 22–32)
TCO2: 24 mmol/L (ref 22–32)
pCO2 arterial: 50.5 mmHg — ABNORMAL HIGH (ref 32.0–48.0)
pCO2 arterial: 44.5 mmHg (ref 32.0–48.0)
pCO2 arterial: 45.5 mmHg (ref 32.0–48.0)
pCO2 arterial: 41.6 mmHg (ref 32.0–48.0)
pH, Arterial: 7.338 — ABNORMAL LOW (ref 7.350–7.450)
pH, Arterial: 7.353 (ref 7.350–7.450)
pH, Arterial: 7.354 (ref 7.350–7.450)
pH, Arterial: 7.351 (ref 7.350–7.450)
pO2, Arterial: 434 mmHg — ABNORMAL HIGH (ref 83.0–108.0)
pO2, Arterial: 107 mmHg (ref 83.0–108.0)
pO2, Arterial: 106 mmHg (ref 83.0–108.0)
pO2, Arterial: 126 mmHg — ABNORMAL HIGH (ref 83.0–108.0)

## 2019-09-10 LAB — CBC
HCT: 33.8 % — ABNORMAL LOW (ref 39.0–52.0)
HCT: 34.9 % — ABNORMAL LOW (ref 39.0–52.0)
Hemoglobin: 11.8 g/dL — ABNORMAL LOW (ref 13.0–17.0)
Hemoglobin: 12.1 g/dL — ABNORMAL LOW (ref 13.0–17.0)
MCH: 34.1 pg — ABNORMAL HIGH (ref 26.0–34.0)
MCH: 33.7 pg (ref 26.0–34.0)
MCHC: 34.9 g/dL (ref 30.0–36.0)
MCHC: 34.7 g/dL (ref 30.0–36.0)
MCV: 97.7 fL (ref 80.0–100.0)
MCV: 97.2 fL (ref 80.0–100.0)
Platelets: 104 10*3/uL — ABNORMAL LOW (ref 150–400)
Platelets: 122 10*3/uL — ABNORMAL LOW (ref 150–400)
RBC: 3.46 MIL/uL — ABNORMAL LOW (ref 4.22–5.81)
RBC: 3.59 MIL/uL — ABNORMAL LOW (ref 4.22–5.81)
RDW: 12.6 % (ref 11.5–15.5)
RDW: 12.6 % (ref 11.5–15.5)
WBC: 12.3 10*3/uL — ABNORMAL HIGH (ref 4.0–10.5)
WBC: 15.1 10*3/uL — ABNORMAL HIGH (ref 4.0–10.5)
nRBC: 0 % (ref 0.0–0.2)
nRBC: 0 % (ref 0.0–0.2)

## 2019-09-10 LAB — POCT I-STAT, CHEM 8
BUN: 13 mg/dL (ref 8–23)
BUN: 11 mg/dL (ref 8–23)
BUN: 12 mg/dL (ref 8–23)
BUN: 13 mg/dL (ref 8–23)
BUN: 12 mg/dL (ref 8–23)
BUN: 12 mg/dL (ref 8–23)
BUN: 11 mg/dL (ref 8–23)
Calcium, Ion: 1.19 mmol/L (ref 1.15–1.40)
Calcium, Ion: 1.08 mmol/L — ABNORMAL LOW (ref 1.15–1.40)
Calcium, Ion: 1.16 mmol/L (ref 1.15–1.40)
Calcium, Ion: 1.21 mmol/L (ref 1.15–1.40)
Calcium, Ion: 1.14 mmol/L — ABNORMAL LOW (ref 1.15–1.40)
Calcium, Ion: 1.13 mmol/L — ABNORMAL LOW (ref 1.15–1.40)
Calcium, Ion: 1.13 mmol/L — ABNORMAL LOW (ref 1.15–1.40)
Chloride: 103 mmol/L (ref 98–111)
Chloride: 101 mmol/L (ref 98–111)
Chloride: 103 mmol/L (ref 98–111)
Chloride: 105 mmol/L (ref 98–111)
Chloride: 101 mmol/L (ref 98–111)
Chloride: 103 mmol/L (ref 98–111)
Chloride: 104 mmol/L (ref 98–111)
Creatinine, Ser: 0.7 mg/dL (ref 0.61–1.24)
Creatinine, Ser: 0.6 mg/dL — ABNORMAL LOW (ref 0.61–1.24)
Creatinine, Ser: 0.6 mg/dL — ABNORMAL LOW (ref 0.61–1.24)
Creatinine, Ser: 0.7 mg/dL (ref 0.61–1.24)
Creatinine, Ser: 0.6 mg/dL — ABNORMAL LOW (ref 0.61–1.24)
Creatinine, Ser: 0.7 mg/dL (ref 0.61–1.24)
Creatinine, Ser: 0.6 mg/dL — ABNORMAL LOW (ref 0.61–1.24)
Glucose, Bld: 226 mg/dL — ABNORMAL HIGH (ref 70–99)
Glucose, Bld: 159 mg/dL — ABNORMAL HIGH (ref 70–99)
Glucose, Bld: 180 mg/dL — ABNORMAL HIGH (ref 70–99)
Glucose, Bld: 182 mg/dL — ABNORMAL HIGH (ref 70–99)
Glucose, Bld: 147 mg/dL — ABNORMAL HIGH (ref 70–99)
Glucose, Bld: 149 mg/dL — ABNORMAL HIGH (ref 70–99)
Glucose, Bld: 146 mg/dL — ABNORMAL HIGH (ref 70–99)
HCT: 37 % — ABNORMAL LOW (ref 39.0–52.0)
HCT: 32 % — ABNORMAL LOW (ref 39.0–52.0)
HCT: 32 % — ABNORMAL LOW (ref 39.0–52.0)
HCT: 33 % — ABNORMAL LOW (ref 39.0–52.0)
HCT: 31 % — ABNORMAL LOW (ref 39.0–52.0)
HCT: 31 % — ABNORMAL LOW (ref 39.0–52.0)
HCT: 32 % — ABNORMAL LOW (ref 39.0–52.0)
Hemoglobin: 12.6 g/dL — ABNORMAL LOW (ref 13.0–17.0)
Hemoglobin: 10.9 g/dL — ABNORMAL LOW (ref 13.0–17.0)
Hemoglobin: 10.9 g/dL — ABNORMAL LOW (ref 13.0–17.0)
Hemoglobin: 11.2 g/dL — ABNORMAL LOW (ref 13.0–17.0)
Hemoglobin: 10.5 g/dL — ABNORMAL LOW (ref 13.0–17.0)
Hemoglobin: 10.5 g/dL — ABNORMAL LOW (ref 13.0–17.0)
Hemoglobin: 10.9 g/dL — ABNORMAL LOW (ref 13.0–17.0)
Potassium: 3.6 mmol/L (ref 3.5–5.1)
Potassium: 3.4 mmol/L — ABNORMAL LOW (ref 3.5–5.1)
Potassium: 3.8 mmol/L (ref 3.5–5.1)
Potassium: 3.9 mmol/L (ref 3.5–5.1)
Potassium: 4.4 mmol/L (ref 3.5–5.1)
Potassium: 4.8 mmol/L (ref 3.5–5.1)
Potassium: 4 mmol/L (ref 3.5–5.1)
Sodium: 140 mmol/L (ref 135–145)
Sodium: 140 mmol/L (ref 135–145)
Sodium: 140 mmol/L (ref 135–145)
Sodium: 141 mmol/L (ref 135–145)
Sodium: 138 mmol/L (ref 135–145)
Sodium: 138 mmol/L (ref 135–145)
Sodium: 139 mmol/L (ref 135–145)
TCO2: 27 mmol/L (ref 22–32)
TCO2: 25 mmol/L (ref 22–32)
TCO2: 26 mmol/L (ref 22–32)
TCO2: 30 mmol/L (ref 22–32)
TCO2: 25 mmol/L (ref 22–32)
TCO2: 25 mmol/L (ref 22–32)
TCO2: 24 mmol/L (ref 22–32)

## 2019-09-10 LAB — PROTIME-INR
INR: 1.4 — ABNORMAL HIGH (ref 0.8–1.2)
Prothrombin Time: 17.4 seconds — ABNORMAL HIGH (ref 11.4–15.2)

## 2019-09-10 LAB — BASIC METABOLIC PANEL
Anion gap: 9 (ref 5–15)
BUN: 11 mg/dL (ref 8–23)
CO2: 22 mmol/L (ref 22–32)
Calcium: 8.1 mg/dL — ABNORMAL LOW (ref 8.9–10.3)
Chloride: 109 mmol/L (ref 98–111)
Creatinine, Ser: 0.73 mg/dL (ref 0.61–1.24)
GFR calc Af Amer: >60 mL/min (ref >60)
GFR calc non Af Amer: >60 mL/min (ref >60)
Glucose, Bld: 131 mg/dL — ABNORMAL HIGH (ref 70–99)
Potassium: 4.2 mmol/L (ref 3.5–5.1)
Sodium: 140 mmol/L (ref 135–145)

## 2019-09-10 LAB — GLUCOSE, CAPILLARY
Glucose-Capillary: 111 mg/dL — ABNORMAL HIGH (ref 70–99)
Glucose-Capillary: 120 mg/dL — ABNORMAL HIGH (ref 70–99)
Glucose-Capillary: 120 mg/dL — ABNORMAL HIGH (ref 70–99)
Glucose-Capillary: 131 mg/dL — ABNORMAL HIGH (ref 70–99)
Glucose-Capillary: 132 mg/dL — ABNORMAL HIGH (ref 70–99)
Glucose-Capillary: 144 mg/dL — ABNORMAL HIGH (ref 70–99)
Glucose-Capillary: 157 mg/dL — ABNORMAL HIGH (ref 70–99)
Glucose-Capillary: 235 mg/dL — ABNORMAL HIGH (ref 70–99)
Glucose-Capillary: 97 mg/dL (ref 70–99)

## 2019-09-10 LAB — ECHO INTRAOPERATIVE TEE
Height: 76 in
Weight: 4704 oz

## 2019-09-10 LAB — APTT: aPTT: 31 seconds (ref 24–36)

## 2019-09-10 LAB — HEMOGLOBIN AND HEMATOCRIT, BLOOD
HCT: 30.3 % — ABNORMAL LOW (ref 39.0–52.0)
Hemoglobin: 10.7 g/dL — ABNORMAL LOW (ref 13.0–17.0)

## 2019-09-10 LAB — PLATELET COUNT: Platelets: 102 10*3/uL — ABNORMAL LOW (ref 150–400)

## 2019-09-10 LAB — MAGNESIUM: Magnesium: 2.7 mg/dL — ABNORMAL HIGH (ref 1.7–2.4)

## 2019-09-10 SURGERY — CORONARY ARTERY BYPASS GRAFTING (CABG)
Anesthesia: General | Site: Chest

## 2019-09-10 MED ORDER — NITROGLYCERIN IN D5W 200-5 MCG/ML-% IV SOLN
0.0000 ug/min | INTRAVENOUS | Status: DC
  Administered 2019-09-10: 16.667 ug/min via INTRAVENOUS

## 2019-09-10 MED ORDER — ATORVASTATIN CALCIUM 80 MG PO TABS
80.0000 mg | ORAL_TABLET | Freq: Every day | ORAL | Status: DC
  Administered 2019-09-11 – 2019-09-14 (×4): 80 mg via ORAL
  Filled 2019-09-10 (×4): qty 1

## 2019-09-10 MED ORDER — MIDAZOLAM HCL (PF) 10 MG/2ML IJ SOLN
INTRAMUSCULAR | Status: AC
  Filled 2019-09-10: qty 2

## 2019-09-10 MED ORDER — ASPIRIN 81 MG PO CHEW: 324.0000 mg | CHEWABLE_TABLET | Freq: Every day | ORAL | Status: DC

## 2019-09-10 MED ORDER — ARTIFICIAL TEARS OPHTHALMIC OINT
TOPICAL_OINTMENT | OPHTHALMIC | Status: AC
  Filled 2019-09-10: qty 3.5

## 2019-09-10 MED ORDER — CHLORHEXIDINE GLUCONATE 0.12 % MT SOLN
15.0000 mL | Freq: Once | OROMUCOSAL | Status: AC
  Administered 2019-09-10 – 2019-09-11 (×2): 15 mL via OROMUCOSAL
  Filled 2019-09-10: qty 15

## 2019-09-10 MED ORDER — BISACODYL 10 MG RE SUPP: 10.0000 mg | Freq: Every day | RECTAL | Status: DC

## 2019-09-10 MED ORDER — ROCURONIUM BROMIDE 10 MG/ML (PF) SYRINGE
PREFILLED_SYRINGE | INTRAVENOUS | Status: AC
  Filled 2019-09-10: qty 10

## 2019-09-10 MED ORDER — LACTATED RINGERS IV SOLN
INTRAVENOUS | Status: DC | PRN
  Administered 2019-09-10: 08:00:00 via INTRAVENOUS

## 2019-09-10 MED ORDER — LACTATED RINGERS IV SOLN: INTRAVENOUS | Status: DC

## 2019-09-10 MED ORDER — ROCURONIUM BROMIDE 10 MG/ML (PF) SYRINGE
PREFILLED_SYRINGE | INTRAVENOUS | Status: DC | PRN
  Administered 2019-09-10: 40 mg via INTRAVENOUS
  Administered 2019-09-10: 60 mg via INTRAVENOUS
  Administered 2019-09-10 (×5): 50 mg via INTRAVENOUS

## 2019-09-10 MED ORDER — SODIUM CHLORIDE 0.9% FLUSH: 3.0000 mL | INTRAVENOUS | Status: DC | PRN

## 2019-09-10 MED ORDER — ROCURONIUM BROMIDE 10 MG/ML (PF) SYRINGE
PREFILLED_SYRINGE | INTRAVENOUS | Status: AC
  Filled 2019-09-10: qty 20

## 2019-09-10 MED ORDER — THROMBIN (RECOMBINANT) 20000 UNITS EX SOLR
CUTANEOUS | Status: AC
  Filled 2019-09-10: qty 20000

## 2019-09-10 MED ORDER — THROMBIN 20000 UNITS EX SOLR
OROMUCOSAL | Status: DC | PRN
  Administered 2019-09-10 (×3): 4 mL via TOPICAL

## 2019-09-10 MED ORDER — ORAL CARE MOUTH RINSE
15.0000 mL | OROMUCOSAL | Status: DC
  Administered 2019-09-10: 15 mL via OROMUCOSAL

## 2019-09-10 MED ORDER — ALBUMIN HUMAN 5 % IV SOLN
250.0000 mL | INTRAVENOUS | Status: AC | PRN
  Administered 2019-09-10 (×2): 12.5 g via INTRAVENOUS
  Filled 2019-09-10: qty 250

## 2019-09-10 MED ORDER — HEMOSTATIC AGENTS (NO CHARGE) OPTIME
TOPICAL | Status: DC | PRN
  Administered 2019-09-10: 1 via TOPICAL

## 2019-09-10 MED ORDER — GLYCOPYRROLATE PF 0.2 MG/ML IJ SOSY
PREFILLED_SYRINGE | INTRAMUSCULAR | Status: AC
  Filled 2019-09-10: qty 1

## 2019-09-10 MED ORDER — CHLORHEXIDINE GLUCONATE CLOTH 2 % EX PADS: 6.0000 | MEDICATED_PAD | Freq: Every day | CUTANEOUS | Status: DC

## 2019-09-10 MED ORDER — PHENYLEPHRINE 40 MCG/ML (10ML) SYRINGE FOR IV PUSH (FOR BLOOD PRESSURE SUPPORT)
PREFILLED_SYRINGE | INTRAVENOUS | Status: DC | PRN
  Administered 2019-09-10: 80 ug via INTRAVENOUS

## 2019-09-10 MED ORDER — ACETAMINOPHEN 650 MG RE SUPP: 650.0000 mg | Freq: Once | RECTAL | Status: AC

## 2019-09-10 MED ORDER — PANTOPRAZOLE SODIUM 40 MG PO TBEC
40.0000 mg | DELAYED_RELEASE_TABLET | Freq: Every day | ORAL | Status: DC
  Filled 2019-09-10: qty 1

## 2019-09-10 MED ORDER — LACTATED RINGERS IV SOLN: 500.0000 mL | Freq: Once | INTRAVENOUS | Status: DC | PRN

## 2019-09-10 MED ORDER — DEXMEDETOMIDINE HCL IN NACL 400 MCG/100ML IV SOLN
0.0000 ug/kg/h | INTRAVENOUS | Status: DC
  Administered 2019-09-10 (×2): 0.5 ug/kg/h via INTRAVENOUS
  Filled 2019-09-10: qty 100

## 2019-09-10 MED ORDER — PROPOFOL 10 MG/ML IV BOLUS
INTRAVENOUS | Status: AC
  Filled 2019-09-10: qty 20

## 2019-09-10 MED ORDER — ACETAMINOPHEN 160 MG/5ML PO SOLN
650.0000 mg | Freq: Once | ORAL | Status: AC
  Administered 2019-09-10: 650 mg

## 2019-09-10 MED ORDER — SODIUM CHLORIDE 0.9 % IV SOLN
INTRAVENOUS | Status: DC
  Administered 2019-09-10 – 2019-09-11 (×2): via INTRAVENOUS

## 2019-09-10 MED ORDER — THROMBIN 20000 UNITS EX SOLR
CUTANEOUS | Status: DC | PRN
  Administered 2019-09-10: 20000 [IU] via TOPICAL

## 2019-09-10 MED ORDER — PROTAMINE SULFATE 10 MG/ML IV SOLN
INTRAVENOUS | Status: DC | PRN
  Administered 2019-09-10: 20 mg via INTRAVENOUS
  Administered 2019-09-10: 450 mg via INTRAVENOUS

## 2019-09-10 MED ORDER — ONDANSETRON HCL 4 MG/2ML IJ SOLN
INTRAMUSCULAR | Status: DC | PRN
  Administered 2019-09-10: 4 mg via INTRAVENOUS

## 2019-09-10 MED ORDER — FAMOTIDINE IN NACL 20-0.9 MG/50ML-% IV SOLN
20.0000 mg | Freq: Two times a day (BID) | INTRAVENOUS | Status: AC
  Administered 2019-09-10: 20 mg via INTRAVENOUS

## 2019-09-10 MED ORDER — TRAMADOL HCL 50 MG PO TABS
50.0000 mg | ORAL_TABLET | ORAL | Status: DC | PRN
  Administered 2019-09-11 (×2): 100 mg via ORAL
  Filled 2019-09-10 (×2): qty 2

## 2019-09-10 MED ORDER — CHLORHEXIDINE GLUCONATE 0.12 % MT SOLN
15.0000 mL | OROMUCOSAL | Status: AC
  Administered 2019-09-10: 15 mL via OROMUCOSAL

## 2019-09-10 MED ORDER — FENTANYL CITRATE (PF) 250 MCG/5ML IJ SOLN
INTRAMUSCULAR | Status: AC
  Filled 2019-09-10: qty 20

## 2019-09-10 MED ORDER — SODIUM CHLORIDE 0.45 % IV SOLN
INTRAVENOUS | Status: DC | PRN
  Administered 2019-09-10: 15:00:00 via INTRAVENOUS

## 2019-09-10 MED ORDER — METOPROLOL TARTRATE 12.5 MG HALF TABLET
12.5000 mg | ORAL_TABLET | Freq: Two times a day (BID) | ORAL | Status: DC
  Administered 2019-09-11 (×2): 12.5 mg via ORAL
  Filled 2019-09-10 (×3): qty 1

## 2019-09-10 MED ORDER — PHENYLEPHRINE HCL-NACL 20-0.9 MG/250ML-% IV SOLN
0.0000 ug/min | INTRAVENOUS | Status: DC
  Administered 2019-09-10: 10 ug/min via INTRAVENOUS

## 2019-09-10 MED ORDER — LACTATED RINGERS IV SOLN
INTRAVENOUS | Status: DC
  Administered 2019-09-10: 15:00:00 via INTRAVENOUS

## 2019-09-10 MED ORDER — BISACODYL 5 MG PO TBEC
10.0000 mg | DELAYED_RELEASE_TABLET | Freq: Every day | ORAL | Status: DC
  Administered 2019-09-11: 10 mg via ORAL
  Filled 2019-09-10 (×2): qty 2

## 2019-09-10 MED ORDER — MIDAZOLAM HCL 5 MG/5ML IJ SOLN
INTRAMUSCULAR | Status: DC | PRN
  Administered 2019-09-10: 2 mg via INTRAVENOUS
  Administered 2019-09-10: 1 mg via INTRAVENOUS
  Administered 2019-09-10 (×3): 2 mg via INTRAVENOUS
  Administered 2019-09-10: 1 mg via INTRAVENOUS

## 2019-09-10 MED ORDER — CHLORHEXIDINE GLUCONATE 4 % EX LIQD: 30.0000 mL | CUTANEOUS | Status: DC

## 2019-09-10 MED ORDER — PROPOFOL 10 MG/ML IV BOLUS
INTRAVENOUS | Status: DC | PRN
  Administered 2019-09-10: 50 mg via INTRAVENOUS
  Administered 2019-09-10: 100 mg via INTRAVENOUS
  Administered 2019-09-10: 50 mg via INTRAVENOUS

## 2019-09-10 MED ORDER — LACTATED RINGERS IV SOLN
INTRAVENOUS | Status: DC | PRN
  Administered 2019-09-10: 07:00:00 via INTRAVENOUS

## 2019-09-10 MED ORDER — LEVOFLOXACIN IN D5W 750 MG/150ML IV SOLN
750.0000 mg | INTRAVENOUS | Status: AC
Start: 2019-09-11 — End: 2019-09-11
  Administered 2019-09-11: 750 mg via INTRAVENOUS
  Filled 2019-09-10: qty 150

## 2019-09-10 MED ORDER — OXYCODONE HCL 5 MG PO TABS
5.0000 mg | ORAL_TABLET | ORAL | Status: DC | PRN
  Administered 2019-09-11: 10 mg via ORAL
  Filled 2019-09-10 (×2): qty 2

## 2019-09-10 MED ORDER — CHLORHEXIDINE GLUCONATE 0.12% ORAL RINSE (MEDLINE KIT): 15.0000 mL | Freq: Two times a day (BID) | OROMUCOSAL | Status: DC

## 2019-09-10 MED ORDER — ONDANSETRON HCL 4 MG/2ML IJ SOLN
4.0000 mg | Freq: Four times a day (QID) | INTRAMUSCULAR | Status: DC | PRN
  Filled 2019-09-10: qty 2

## 2019-09-10 MED ORDER — PHENYLEPHRINE 40 MCG/ML (10ML) SYRINGE FOR IV PUSH (FOR BLOOD PRESSURE SUPPORT)
PREFILLED_SYRINGE | INTRAVENOUS | Status: AC
  Filled 2019-09-10: qty 10

## 2019-09-10 MED ORDER — METOPROLOL TARTRATE 5 MG/5ML IV SOLN
2.5000 mg | INTRAVENOUS | Status: DC | PRN
  Administered 2019-09-10: 5 mg via INTRAVENOUS
  Filled 2019-09-10: qty 5

## 2019-09-10 MED ORDER — PROTAMINE SULFATE 10 MG/ML IV SOLN
INTRAVENOUS | Status: AC
  Filled 2019-09-10: qty 25

## 2019-09-10 MED ORDER — SODIUM CHLORIDE 0.9 % IV SOLN: 250.0000 mL | INTRAVENOUS | Status: DC

## 2019-09-10 MED ORDER — POTASSIUM CHLORIDE 10 MEQ/50ML IV SOLN: 10.0000 meq | INTRAVENOUS | Status: AC

## 2019-09-10 MED ORDER — GLYCOPYRROLATE 0.2 MG/ML IJ SOLN
INTRAMUSCULAR | Status: DC | PRN
  Administered 2019-09-10 (×2): .1 mg via INTRAVENOUS

## 2019-09-10 MED ORDER — PLASMA-LYTE 148 IV SOLN
INTRAVENOUS | Status: DC | PRN
  Administered 2019-09-10: 500 mL via INTRAVASCULAR

## 2019-09-10 MED ORDER — HEPARIN SODIUM (PORCINE) 1000 UNIT/ML IJ SOLN
INTRAMUSCULAR | Status: DC | PRN
  Administered 2019-09-10: 47000 [IU] via INTRAVENOUS

## 2019-09-10 MED ORDER — SODIUM CHLORIDE 0.9% FLUSH
3.0000 mL | Freq: Two times a day (BID) | INTRAVENOUS | Status: DC
  Administered 2019-09-11: 3 mL via INTRAVENOUS

## 2019-09-10 MED ORDER — GLYCOPYRROLATE 0.2 MG/ML IJ SOLN: INTRAMUSCULAR | Status: DC | PRN

## 2019-09-10 MED ORDER — ASPIRIN EC 325 MG PO TBEC
325.0000 mg | DELAYED_RELEASE_TABLET | Freq: Every day | ORAL | Status: DC
  Administered 2019-09-11: 325 mg via ORAL
  Filled 2019-09-10 (×2): qty 1

## 2019-09-10 MED ORDER — TRANEXAMIC ACID 1000 MG/10ML IV SOLN
1.5000 mg/kg/h | INTRAVENOUS | Status: DC
  Administered 2019-09-10: 1.5 mg/kg/h via INTRAVENOUS
  Filled 2019-09-10: qty 25

## 2019-09-10 MED ORDER — INSULIN REGULAR(HUMAN) IN NACL 100-0.9 UT/100ML-% IV SOLN
INTRAVENOUS | Status: DC
  Administered 2019-09-10: 3 [IU]/h via INTRAVENOUS
  Filled 2019-09-10: qty 100

## 2019-09-10 MED ORDER — METOPROLOL TARTRATE 12.5 MG HALF TABLET
12.5000 mg | ORAL_TABLET | Freq: Once | ORAL | Status: DC
  Filled 2019-09-10: qty 1

## 2019-09-10 MED ORDER — ORAL CARE MOUTH RINSE
15.0000 mL | Freq: Two times a day (BID) | OROMUCOSAL | Status: DC
  Administered 2019-09-10 – 2019-09-14 (×6): 15 mL via OROMUCOSAL

## 2019-09-10 MED ORDER — ACETAMINOPHEN 500 MG PO TABS
1000.0000 mg | ORAL_TABLET | Freq: Four times a day (QID) | ORAL | Status: DC
  Administered 2019-09-10 – 2019-09-12 (×4): 1000 mg via ORAL
  Filled 2019-09-10 (×4): qty 2

## 2019-09-10 MED ORDER — MORPHINE SULFATE (PF) 2 MG/ML IV SOLN
1.0000 mg | INTRAVENOUS | Status: DC | PRN
  Administered 2019-09-10: 4 mg via INTRAVENOUS
  Administered 2019-09-10: 2 mg via INTRAVENOUS
  Administered 2019-09-10: 4 mg via INTRAVENOUS
  Administered 2019-09-11 (×2): 2 mg via INTRAVENOUS
  Filled 2019-09-10: qty 2
  Filled 2019-09-10 (×3): qty 1
  Filled 2019-09-10: qty 2
  Filled 2019-09-10: qty 1

## 2019-09-10 MED ORDER — VANCOMYCIN HCL IN DEXTROSE 1-5 GM/200ML-% IV SOLN
1000.0000 mg | Freq: Once | INTRAVENOUS | Status: AC
  Administered 2019-09-10: 1000 mg via INTRAVENOUS
  Filled 2019-09-10: qty 200

## 2019-09-10 MED ORDER — DEXTROSE 50 % IV SOLN: 0.0000 mL | INTRAVENOUS | Status: DC | PRN

## 2019-09-10 MED ORDER — ONDANSETRON HCL 4 MG/2ML IJ SOLN
INTRAMUSCULAR | Status: AC
  Filled 2019-09-10: qty 2

## 2019-09-10 MED ORDER — MIDAZOLAM HCL 2 MG/2ML IJ SOLN: 2.0000 mg | INTRAMUSCULAR | Status: DC | PRN

## 2019-09-10 MED ORDER — MAGNESIUM SULFATE 4 GM/100ML IV SOLN
4.0000 g | Freq: Once | INTRAVENOUS | Status: AC
  Administered 2019-09-10: 4 g via INTRAVENOUS
  Filled 2019-09-10: qty 100

## 2019-09-10 MED ORDER — METOPROLOL TARTRATE 25 MG/10 ML ORAL SUSPENSION: 12.5000 mg | Freq: Two times a day (BID) | ORAL | Status: DC

## 2019-09-10 MED ORDER — 0.9 % SODIUM CHLORIDE (POUR BTL) OPTIME
TOPICAL | Status: DC | PRN
  Administered 2019-09-10: 6000 mL

## 2019-09-10 MED ORDER — HEPARIN SODIUM (PORCINE) 1000 UNIT/ML IJ SOLN
INTRAMUSCULAR | Status: AC
  Filled 2019-09-10: qty 1

## 2019-09-10 MED ORDER — DOCUSATE SODIUM 100 MG PO CAPS
200.0000 mg | ORAL_CAPSULE | Freq: Every day | ORAL | Status: DC
  Administered 2019-09-11: 200 mg via ORAL
  Filled 2019-09-10 (×2): qty 2

## 2019-09-10 MED ORDER — ACETAMINOPHEN 160 MG/5ML PO SOLN
1000.0000 mg | Freq: Four times a day (QID) | ORAL | Status: DC
  Administered 2019-09-11: 05:00:00 1000 mg

## 2019-09-10 MED ORDER — FENTANYL CITRATE (PF) 250 MCG/5ML IJ SOLN
INTRAMUSCULAR | Status: DC | PRN
  Administered 2019-09-10: 50 ug via INTRAVENOUS
  Administered 2019-09-10: 100 ug via INTRAVENOUS
  Administered 2019-09-10: 200 ug via INTRAVENOUS
  Administered 2019-09-10: 100 ug via INTRAVENOUS
  Administered 2019-09-10: 50 ug via INTRAVENOUS
  Administered 2019-09-10: 100 ug via INTRAVENOUS
  Administered 2019-09-10: 150 ug via INTRAVENOUS
  Administered 2019-09-10: 50 ug via INTRAVENOUS
  Administered 2019-09-10 (×2): 100 ug via INTRAVENOUS

## 2019-09-10 SURGICAL SUPPLY — 114 items
ARTICLIP LAA PROCLIP II 45 (Clip) ×3 IMPLANT
ARTICLIP LAA PROCLIP II 45MM (Clip) ×1 IMPLANT
BAG DECANTER FOR FLEXI CONT (MISCELLANEOUS) ×4 IMPLANT
BASKET HEART  (ORDER IN 25'S) (MISCELLANEOUS) ×1
BASKET HEART (ORDER IN 25'S) (MISCELLANEOUS) ×1
BASKET HEART (ORDER IN 25S) (MISCELLANEOUS) ×2 IMPLANT
BLADE CLIPPER SURG (BLADE) ×4 IMPLANT
BLADE STERNUM SYSTEM 6 (BLADE) ×4 IMPLANT
BLADE SURG 11 STRL SS (BLADE) ×4 IMPLANT
BNDG ELASTIC 4X5.8 VLCR STR LF (GAUZE/BANDAGES/DRESSINGS) ×4 IMPLANT
BNDG ELASTIC 6X10 VLCR STRL LF (GAUZE/BANDAGES/DRESSINGS) ×4 IMPLANT
BNDG ELASTIC 6X5.8 VLCR STR LF (GAUZE/BANDAGES/DRESSINGS) ×4 IMPLANT
BNDG GAUZE ELAST 4 BULKY (GAUZE/BANDAGES/DRESSINGS) ×4 IMPLANT
CANISTER SUCT 3000ML PPV (MISCELLANEOUS) ×4 IMPLANT
CANNULA EZ GLIDE 8.0 24FR (CANNULA) ×4 IMPLANT
CATH ROBINSON RED A/P 18FR (CATHETERS) ×8 IMPLANT
CATH THORACIC 28FR (CATHETERS) ×4 IMPLANT
CATH THORACIC 36FR (CATHETERS) ×4 IMPLANT
CATH THORACIC 36FR RT ANG (CATHETERS) ×4 IMPLANT
CLIP VESOCCLUDE MED 24/CT (CLIP) IMPLANT
CLIP VESOCCLUDE SM WIDE 24/CT (CLIP) ×8 IMPLANT
COVER WAND RF STERILE (DRAPES) IMPLANT
DERMABOND ADHESIVE PROPEN (GAUZE/BANDAGES/DRESSINGS) ×2
DERMABOND ADVANCED .7 DNX6 (GAUZE/BANDAGES/DRESSINGS) ×2 IMPLANT
DEVICE ATRICLIP LAA PRCLPII 45 (Clip) ×2 IMPLANT
DRAPE CARDIOVASCULAR INCISE (DRAPES) ×2
DRAPE SLUSH/WARMER DISC (DRAPES) ×4 IMPLANT
DRAPE SRG 135X102X78XABS (DRAPES) ×2 IMPLANT
DRSG COVADERM 4X14 (GAUZE/BANDAGES/DRESSINGS) ×4 IMPLANT
ELECT CAUTERY BLADE 6.4 (BLADE) ×4 IMPLANT
ELECT REM PT RETURN 9FT ADLT (ELECTROSURGICAL) ×8
ELECTRODE REM PT RTRN 9FT ADLT (ELECTROSURGICAL) ×4 IMPLANT
FELT TEFLON 1X6 (MISCELLANEOUS) ×8 IMPLANT
GAUZE SPONGE 4X4 12PLY STRL (GAUZE/BANDAGES/DRESSINGS) ×4 IMPLANT
GAUZE SPONGE 4X4 12PLY STRL LF (GAUZE/BANDAGES/DRESSINGS) ×4 IMPLANT
GLOVE BIO SURGEON STRL SZ 6 (GLOVE) ×8 IMPLANT
GLOVE BIO SURGEON STRL SZ 6.5 (GLOVE) ×18 IMPLANT
GLOVE BIO SURGEON STRL SZ7 (GLOVE) IMPLANT
GLOVE BIO SURGEON STRL SZ7.5 (GLOVE) IMPLANT
GLOVE BIO SURGEONS STRL SZ 6.5 (GLOVE) ×6
GLOVE BIOGEL PI IND STRL 6 (GLOVE) ×2 IMPLANT
GLOVE BIOGEL PI IND STRL 6.5 (GLOVE) ×2 IMPLANT
GLOVE BIOGEL PI IND STRL 7.0 (GLOVE) IMPLANT
GLOVE BIOGEL PI INDICATOR 6 (GLOVE) ×2
GLOVE BIOGEL PI INDICATOR 6.5 (GLOVE) ×2
GLOVE BIOGEL PI INDICATOR 7.0 (GLOVE)
GLOVE EUDERMIC 7 POWDERFREE (GLOVE) ×8 IMPLANT
GLOVE ORTHO TXT STRL SZ7.5 (GLOVE) IMPLANT
GOWN STRL REUS W/ TWL LRG LVL3 (GOWN DISPOSABLE) ×12 IMPLANT
GOWN STRL REUS W/ TWL XL LVL3 (GOWN DISPOSABLE) ×4 IMPLANT
GOWN STRL REUS W/TWL LRG LVL3 (GOWN DISPOSABLE) ×12
GOWN STRL REUS W/TWL XL LVL3 (GOWN DISPOSABLE) ×4
HEMOSTAT POWDER SURGIFOAM 1G (HEMOSTASIS) ×12 IMPLANT
HEMOSTAT SURGICEL 2X14 (HEMOSTASIS) ×4 IMPLANT
INSERT FOGARTY 61MM (MISCELLANEOUS) IMPLANT
INSERT FOGARTY XLG (MISCELLANEOUS) ×4 IMPLANT
KIT BASIN OR (CUSTOM PROCEDURE TRAY) ×4 IMPLANT
KIT CATH CPB BARTLE (MISCELLANEOUS) ×4 IMPLANT
KIT SUCTION CATH 14FR (SUCTIONS) ×4 IMPLANT
KIT TURNOVER KIT B (KITS) ×4 IMPLANT
KIT VASOVIEW HEMOPRO 2 VH 4000 (KITS) ×4 IMPLANT
NS IRRIG 1000ML POUR BTL (IV SOLUTION) ×24 IMPLANT
PACK E OPEN HEART (SUTURE) ×4 IMPLANT
PACK OPEN HEART (CUSTOM PROCEDURE TRAY) ×4 IMPLANT
PAD ARMBOARD 7.5X6 YLW CONV (MISCELLANEOUS) ×8 IMPLANT
PAD ELECT DEFIB RADIOL ZOLL (MISCELLANEOUS) ×4 IMPLANT
PENCIL BUTTON HOLSTER BLD 10FT (ELECTRODE) ×4 IMPLANT
POSITIONER HEAD DONUT 9IN (MISCELLANEOUS) ×4 IMPLANT
PUNCH AORTIC ROTATE 4.0MM (MISCELLANEOUS) IMPLANT
PUNCH AORTIC ROTATE 4.5MM 8IN (MISCELLANEOUS) ×4 IMPLANT
PUNCH AORTIC ROTATE 5MM 8IN (MISCELLANEOUS) IMPLANT
SET CARDIOPLEGIA MPS 5001102 (MISCELLANEOUS) ×4 IMPLANT
SOL ANTI FOG 6CC (MISCELLANEOUS) ×2 IMPLANT
SOLUTION ANTI FOG 6CC (MISCELLANEOUS) ×2
SPONGE INTESTINAL PEANUT (DISPOSABLE) IMPLANT
SPONGE LAP 18X18 RF (DISPOSABLE) IMPLANT
SPONGE LAP 18X18 X RAY DECT (DISPOSABLE) ×4 IMPLANT
SPONGE LAP 4X18 RFD (DISPOSABLE) ×8 IMPLANT
SUT BONE WAX W31G (SUTURE) ×4 IMPLANT
SUT MNCRL AB 4-0 PS2 18 (SUTURE) ×4 IMPLANT
SUT PROLENE 3 0 SH DA (SUTURE) IMPLANT
SUT PROLENE 3 0 SH1 36 (SUTURE) ×4 IMPLANT
SUT PROLENE 4 0 RB 1 (SUTURE)
SUT PROLENE 4 0 SH DA (SUTURE) IMPLANT
SUT PROLENE 4-0 RB1 .5 CRCL 36 (SUTURE) IMPLANT
SUT PROLENE 5 0 C 1 36 (SUTURE) IMPLANT
SUT PROLENE 6 0 C 1 30 (SUTURE) ×8 IMPLANT
SUT PROLENE 7 0 BV 1 (SUTURE) ×4 IMPLANT
SUT PROLENE 7 0 BV1 MDA (SUTURE) ×4 IMPLANT
SUT PROLENE 8 0 BV175 6 (SUTURE) ×8 IMPLANT
SUT SILK  1 MH (SUTURE)
SUT SILK 1 MH (SUTURE) IMPLANT
SUT SILK 2 0 SH (SUTURE) IMPLANT
SUT SILK 2 0 SH CR/8 (SUTURE) ×4 IMPLANT
SUT STEEL STERNAL CCS#1 18IN (SUTURE) IMPLANT
SUT STEEL SZ 6 DBL 3X14 BALL (SUTURE) IMPLANT
SUT VIC AB 1 CTX 36 (SUTURE) ×4
SUT VIC AB 1 CTX36XBRD ANBCTR (SUTURE) ×4 IMPLANT
SUT VIC AB 2-0 CT1 27 (SUTURE) ×2
SUT VIC AB 2-0 CT1 TAPERPNT 27 (SUTURE) ×2 IMPLANT
SUT VIC AB 2-0 CTX 27 (SUTURE) IMPLANT
SUT VIC AB 3-0 SH 27 (SUTURE)
SUT VIC AB 3-0 SH 27X BRD (SUTURE) IMPLANT
SUT VIC AB 3-0 X1 27 (SUTURE) IMPLANT
SUT VICRYL 4-0 PS2 18IN ABS (SUTURE) IMPLANT
SYSTEM SAHARA CHEST DRAIN ATS (WOUND CARE) ×4 IMPLANT
TAPE CLOTH SURG 4X10 WHT LF (GAUZE/BANDAGES/DRESSINGS) ×4 IMPLANT
TAPE PAPER 2X10 WHT MICROPORE (GAUZE/BANDAGES/DRESSINGS) ×4 IMPLANT
TOWEL GREEN STERILE (TOWEL DISPOSABLE) ×4 IMPLANT
TOWEL GREEN STERILE FF (TOWEL DISPOSABLE) ×4 IMPLANT
TRAY FOLEY SLVR 16FR TEMP STAT (SET/KITS/TRAYS/PACK) ×4 IMPLANT
TUBING LAP HI FLOW INSUFFLATIO (TUBING) ×4 IMPLANT
UNDERPAD 30X30 (UNDERPADS AND DIAPERS) ×4 IMPLANT
WATER STERILE IRR 1000ML POUR (IV SOLUTION) ×8 IMPLANT

## 2019-09-10 NOTE — Anesthesia Procedure Notes (Signed)
Central Venous Catheter Insertion Performed by: Murvin Natal, MD, anesthesiologist Start/End12/07/2019 6:45 AM, 09/10/2019 7:00 AM Patient location: Pre-op. Preanesthetic checklist: patient identified, IV checked, site marked, risks and benefits discussed, surgical consent, monitors and equipment checked, pre-op evaluation, timeout performed and anesthesia consent Position: Trendelenburg Patient sedated Hand hygiene performed , maximum sterile barriers used  and Seldinger technique used Catheter size: 9 Fr Total catheter length 12. PA cath was placed.MAC introducer Swan type:thermodilution PA Cath depth:52 Procedure performed using ultrasound guided technique. Ultrasound Notes:anatomy identified, needle tip was noted to be adjacent to the nerve/plexus identified and no ultrasound evidence of intravascular and/or intraneural injection Attempts: 1 Following insertion, line sutured, dressing applied and Biopatch. Post procedure assessment: blood return through all ports, free fluid flow and no air  Patient tolerated the procedure well with no immediate complications.

## 2019-09-10 NOTE — Anesthesia Procedure Notes (Signed)
Arterial Line Insertion Start/End12/07/2019 7:00 AM, 09/10/2019 7:15 AM Performed by: Murvin Natal, MD, anesthesiologist  Patient location: Pre-op. Preanesthetic checklist: patient identified, IV checked, site marked, risks and benefits discussed, surgical consent, monitors and equipment checked, pre-op evaluation, timeout performed and anesthesia consent Lidocaine 1% used for infiltration and patient sedated radial was placed Catheter size: 20 Fr Hand hygiene performed , maximum sterile barriers used  and Seldinger technique used  Attempts: 1 (Previous attempts by CRNA) Procedure performed using ultrasound guided technique. Ultrasound Notes:anatomy identified, needle tip was noted to be adjacent to the nerve/plexus identified and no ultrasound evidence of intravascular and/or intraneural injection Following insertion, dressing applied and Biopatch. Post procedure assessment: normal and unchanged  Patient tolerated the procedure well with no immediate complications.

## 2019-09-10 NOTE — Progress Notes (Signed)
Called Dr Ermalene Postin, Anesthesia to report patient's BP this am was 208/114 left and 208/79 right.  CBG was 235 (312 at PAT appt).  No orders given.

## 2019-09-10 NOTE — Progress Notes (Signed)
  Echocardiogram Echocardiogram Transesophageal has been performed.  Ryan Vaughan 09/10/2019, 8:34 AM

## 2019-09-10 NOTE — Transfer of Care (Signed)
Immediate Anesthesia Transfer of Care Note  Patient: KHYRI BARCO  Procedure(s) Performed: CORONARY ARTERY BYPASS GRAFTING (CABG) x4 , using left internal mammary artery, and right leg greater saphenous vein harvested endoscopically (N/A Chest) CLIPPING OF ATRIAL APPENDAGE Using AtriCure PRO2 Clip Size 77mm (N/A Chest) TRANSESOPHAGEAL ECHOCARDIOGRAM (TEE) (N/A )  Patient Location: ICU  Anesthesia Type:General  Level of Consciousness: Patient remains intubated per anesthesia plan  Airway & Oxygen Therapy: Patient remains intubated per anesthesia plan and Patient placed on Ventilator (see vital sign flow sheet for setting)  Post-op Assessment: Report given to RN  Post vital signs: Reviewed and stable  Last Vitals:  Vitals Value Taken Time  BP    Temp    Pulse    Resp 11 09/10/19 1334  SpO2    Vitals shown include unvalidated device data.  Last Pain:  Vitals:   09/10/19 0615  TempSrc: Oral  PainSc: 0-No pain      Patients Stated Pain Goal: 4 ([id number])  Complications: No apparent anesthesia complications

## 2019-09-10 NOTE — Brief Op Note (Signed)
09/10/2019  1:41 PM  PATIENT:  Ryan Vaughan  73 y.o. male  PRE-OPERATIVE DIAGNOSIS:  CAD AFIB  POST-OPERATIVE DIAGNOSIS:  CAD AFIB  PROCEDURE:  Procedure(s):  CORONARY ARTERY BYPASS GRAFTING x 4 -LIMA to LAD -SVG to DIAGONAL -SEQUENTIAL SVG to DISTAL RCA and PL  CLIPPING OF ATRIAL APPENDAGE  -Size 45 mm Atricure Pro Clip 2  ENDOSCOPIC HARVEST GREATER SAPHENOUS VEIN -Right Leg  TRANSESOPHAGEAL ECHOCARDIOGRAM (TEE) (N/A)  SURGEON:  Surgeon(s) and Role:    * Bartle, Fernande Boyden, MD - Primary  PHYSICIAN ASSISTANT: Khristin Keleher PA-C  ANESTHESIA:   general  EBL:  450 mL   BLOOD ADMINISTERED: CELLSAVER  DRAINS: Left Pleural Chest Drains, Mediastinal Chest Drains   LOCAL MEDICATIONS USED:  NONE  SPECIMEN:  No Specimen  DISPOSITION OF SPECIMEN:  N/A  COUNTS:  YES  TOURNIQUET:  * No tourniquets in log *  DICTATION: .Dragon Dictation  PLAN OF CARE: Admit to inpatient   PATIENT DISPOSITION:  ICU - intubated and hemodynamically stable.   Delay start of Pharmacological VTE agent (>24hrs) due to surgical blood loss or risk of bleeding: yes

## 2019-09-10 NOTE — Anesthesia Postprocedure Evaluation (Signed)
Anesthesia Post Note  Patient: Ryan Vaughan  Procedure(s) Performed: CORONARY ARTERY BYPASS GRAFTING (CABG) x4 , using left internal mammary artery, and right leg greater saphenous vein harvested endoscopically (N/A Chest) CLIPPING OF ATRIAL APPENDAGE Using AtriCure PRO2 Clip Size 14mm (N/A Chest) TRANSESOPHAGEAL ECHOCARDIOGRAM (TEE) (N/A )     Patient location during evaluation: ICU Anesthesia Type: General Level of consciousness: sedated Pain management: pain level controlled Vital Signs Assessment: post-procedure vital signs reviewed and stable Respiratory status: patient remains intubated per anesthesia plan Cardiovascular status: stable Postop Assessment: no apparent nausea or vomiting Anesthetic complications: no    Last Vitals:  Vitals:   09/10/19 0623 09/10/19 1328  BP:  121/68  Pulse: 69 80  Resp:  12  Temp:    SpO2:  99%    Last Pain:  Vitals:   09/10/19 0615  TempSrc: Oral  PainSc: 0-No pain                 Kenan Moodie P Jasminne Mealy

## 2019-09-10 NOTE — Procedures (Signed)
Extubation Procedure Note  Patient Details:   Name: DWAN COADY DOB: 1945/12/24 MRN: [id number]   Airway Documentation:    Vent end date: 09/10/19 Vent end time: 1805   Evaluation  O2 sats: stable throughout Complications: No apparent complications Patient did tolerate procedure well. Bilateral Breath Sounds: Clear   Yes   Patient was extubated to a 4L [place] without any complications, dyspnea or stridor noted. NIF: greater than -40, VC: 1.3L, positive cuff leak. Patient was instructed on IS x 5, highest goal reached was 742mL.   Claretta Fraise 09/10/2019, 6:05 PM

## 2019-09-10 NOTE — Progress Notes (Signed)
Patient ID: Ryan Vaughan, male   DOB: 18-May-1946, 73 y.o.   MRN: [id number]  TCTS Evening Rounds:   Hemodynamically stable, AV paced 80 CI = 2.1 on no inotropes  Has started to wake up on vent.   Urine output good  CT output low  CBC    Component Value Date/Time   WBC 12.3 (H) 09/10/2019 1400   RBC 3.46 (L) 09/10/2019 1400   HGB 11.8 (L) 09/10/2019 1400   HGB 14.1 08/24/2019 1114   HCT 33.8 (L) 09/10/2019 1400   HCT 41.0 08/24/2019 1114   PLT 104 (L) 09/10/2019 1400   PLT 159 08/24/2019 1114   MCV 97.7 09/10/2019 1400   MCV 97 08/24/2019 1114   MCH 34.1 (H) 09/10/2019 1400   MCHC 34.9 09/10/2019 1400   RDW 12.6 09/10/2019 1400   RDW 12.0 08/24/2019 1114   LYMPHSABS 1.7 08/25/2018 0839   EOSABS 0.3 08/25/2018 0839   BASOSABS 0.1 08/25/2018 0839     BMET    Component Value Date/Time   NA 139 09/10/2019 1231   NA 140 08/24/2019 1114   K 4.0 09/10/2019 1231   CL 104 09/10/2019 1231   CO2 19 (L) 09/08/2019 1122   GLUCOSE 146 (H) 09/10/2019 1231   BUN 11 09/10/2019 1231   BUN 16 08/24/2019 1114   CREATININE 0.60 (L) 09/10/2019 1231   CALCIUM 9.0 09/08/2019 1122   GFRNONAA >60 09/08/2019 1122   GFRAA >60 09/08/2019 1122     A/P:  Stable postop course. Continue current plans

## 2019-09-10 NOTE — Anesthesia Procedure Notes (Signed)
Procedure Name: Intubation Date/Time: 09/10/2019 7:53 AM Performed by: Barrington Ellison, CRNA Pre-anesthesia Checklist: Patient identified, Emergency Drugs available, Suction available and Patient being monitored Patient Re-evaluated:Patient Re-evaluated prior to induction Oxygen Delivery Method: Circle System Utilized Preoxygenation: Pre-oxygenation with 100% oxygen Induction Type: IV induction Ventilation: Mask ventilation without difficulty Laryngoscope Size: Mac and 4 Grade View: Grade I Tube type: Oral Tube size: 8.0 mm Number of attempts: 1 Airway Equipment and Method: Stylet and Oral airway Placement Confirmation: ETT inserted through vocal cords under direct vision,  positive ETCO2 and breath sounds checked- equal and bilateral Secured at: 23 cm Tube secured with: Tape Dental Injury: Teeth and Oropharynx as per pre-operative assessment

## 2019-09-10 NOTE — Op Note (Addendum)
CARDIOVASCULAR SURGERY OPERATIVE NOTE  09/10/2019  Surgeon:  Gaye Pollack, MD  First Assistant: Ellwood Handler,  PA-C   Preoperative Diagnosis:  Severe multi-vessel coronary artery disease   Postoperative Diagnosis:  Same   Procedure:  1. Median Sternotomy 2. Extracorporeal circulation 3.   Coronary artery bypass grafting x 4   Left internal mammary graft to the LAD  SVG to diagonal  Sequential SVG to distal RCA and PL  4.   Endoscopic vein harvest from the right leg 5.   Clipping of left atrial appendage  Anesthesia:  General Endotracheal   Clinical History/Surgical Indication:  This 73 year old gentleman has severe three-vessel coronary disease with recent onset of exertional shortness of breath and fatigue without chest pain. He had an abnormal nuclear stress test showing inferior ischemia. Cardiac catheterization shows a 95% mid to distal right coronary stenosis supplying a large posterior descending branch. The continuation of the right coronary artery is occluded with filling of the distal vessel by collaterals from the left circumflex. Left circumflex has moderate nonobstructive stenosis. The LAD has 80% mid vessel stenosis. I agree that coronary artery bypass graft surgery is the best option for this diabetic patient. He has permanent atrial fibrillation with large right and left atria. I would plan to place a clip on his left atrial appendage. I reviewed the cardiac catheterization films with him and his daughter and answered all of their questions. I discussed the operative procedure with the patient and family including alternatives, benefits and risks; including but not limited to bleeding, blood transfusion, infection, stroke, myocardial infarction, graft failure, heart block requiring a permanent pacemaker, organ dysfunction, and death. Laurine Blazer understands and agrees  to proceed.  Preparation:  The patient was seen in the preoperative holding area and the correct patient, correct operation were confirmed with the patient after reviewing the medical record and catheterization. The consent was signed by me. Preoperative antibiotics were given. A pulmonary arterial line and radial arterial line were placed by the anesthesia team. The patient was taken back to the operating room and positioned supine on the operating room table. After being placed under general endotracheal anesthesia by the anesthesia team a foley catheter was placed. The neck, chest, abdomen, and both legs were prepped with betadine soap and solution and draped in the usual sterile manner. A surgical time-out was taken and the correct patient and operative procedure were confirmed with the nursing and anesthesia staff.   Cardiopulmonary Bypass:  A median sternotomy was performed. The pericardium was opened in the midline. Right ventricular function appeared normal. The ascending aorta was of normal size and had no palpable plaque. There were no contraindications to aortic cannulation or cross-clamping. The patient was fully systemically heparinized and the ACT was maintained > 400 sec. The proximal aortic arch was cannulated with a 24 F aortic cannula for arterial inflow. Venous cannulation was performed via the right atrial appendage using a two-staged venous cannula. An antegrade cardioplegia/vent cannula was inserted into the mid-ascending aorta. Aortic occlusion was performed with a single cross-clamp. Systemic cooling to 32 degrees Centigrade and topical cooling of the heart with iced saline were used. Hyperkalemic antegrade cold blood cardioplegia was used to induce diastolic arrest and was then given at about 20 minute intervals throughout the period of arrest to maintain myocardial temperature at or below 10 degrees centigrade. A temperature probe was inserted into the interventricular septum and  an insulating pad was placed in the pericardium.   Left internal mammary  harvest:  The left side of the sternum was retracted using the Rultract retractor. The left internal mammary artery was harvested as a pedicle graft. All side branches were clipped. It was a large-sized vessel of good quality with excellent blood flow. It was ligated distally and divided. It was sprayed with topical papaverine solution to prevent vasospasm.   Endoscopic vein harvest:  The right greater saphenous vein was harvested endoscopically through a 2 cm incision medial to the right knee. It was harvested from the upper thigh to below the knee. It was a medium-sized vein of good quality. The side branches were all ligated with 4-0 silk ties.    Coronary arteries:  The coronary arteries were examined.   LAD:  Large vessel with no distal disease. The diagonal was a medium caliber vessel with no distal disease.  LCX:  Medium sized OM branches with no disease  RCA:  Large PDA and PL. The PDA was lying beneath a large PD vein so I grafted the distal RCA just before the origin of the PDA.   Grafts:  1. LIMA to the LAD: 3.0 mm. It was sewn end to side using 8-0 prolene continuous suture. 2. SVG to diagonal:  1.6 mm. It was sewn end to side using 7-0 prolene continuous suture. 3. Sequential SVG to distal RCA:  3.0 mm. It was sewn sequential side to side using 7-0 prolene continuous suture. 4. Sequential SVG to PL:  2.0 mm. It was sewn sequential end to side using 7-0 prolene continuous suture.  The proximal vein graft anastomoses were performed to the mid-ascending aorta using continuous 6-0 prolene suture. Graft markers were placed around the proximal anastomoses.  Ligation of left atrial appendage:   The base of the appendage was measured and a 45 mm  Atricure Proclip ll was chosen. This was placed across the base of the LAA without difficulty.     Completion:  The patient was rewarmed to 37 degrees  Centigrade. The clamp was removed from the LIMA pedicle and there was rapid warming of the septum and return of ventricular fibrillation. The crossclamp was removed with a time of 90 minutes. There was spontaneous return of junctional bradycardia rhythm. The distal and proximal anastomoses were checked for hemostasis. The position of the grafts was satisfactory. Two temporary epicardial pacing wires were placed on the right atrium and two on the right ventricle. The patient was weaned from CPB without difficulty on no inotropes. CPB time was 110 minutes. Cardiac output was 6 LPM. TEE showed normal LV systolic function. Heparin was fully reversed with protamine and the aortic and venous cannulas removed. Hemostasis was achieved. Mediastinal and left pleural drainage tubes were placed. The sternum was closed with double #6 stainless steel wires. The fascia was closed with continuous # 1 vicryl suture. The subcutaneous tissue was closed with 2-0 vicryl continuous suture. The skin was closed with 3-0 vicryl subcuticular suture. All sponge, needle, and instrument counts were reported correct at the end of the case. Dry sterile dressings were placed over the incisions and around the chest tubes which were connected to pleurevac suction. The patient was then transported to the surgical intensive care unit in stable condition.

## 2019-09-10 NOTE — Interval H&P Note (Signed)
History and Physical Interval Note:  09/10/2019 6:40 AM  Laurine Blazer  has presented today for surgery, with the diagnosis of CAD AFIB.  The various methods of treatment have been discussed with the patient and family. After consideration of risks, benefits and other options for treatment, the patient has consented to  Procedure(s): CORONARY ARTERY BYPASS GRAFTING (CABG) (N/A) CLIPPING OF ATRIAL APPENDAGE (N/A) TRANSESOPHAGEAL ECHOCARDIOGRAM (TEE) (N/A) as a surgical intervention.  The patient's history has been reviewed, patient examined, no change in status, stable for surgery.  I have reviewed the patient's chart and labs.  Questions were answered to the patient's satisfaction.     Gaye Pollack

## 2019-09-10 NOTE — OR Nursing (Signed)
Forty-five minute call to 2 Heart Charge nurse at 1220. Twenty minute call to 2 Heart Charge nurse at 1251. Cath Lab also notified of timing.

## 2019-09-11 ENCOUNTER — Inpatient Hospital Stay (HOSPITAL_COMMUNITY): Payer: PPO

## 2019-09-11 LAB — CBC
HCT: 32.7 % — ABNORMAL LOW (ref 39.0–52.0)
HCT: 32.6 % — ABNORMAL LOW (ref 39.0–52.0)
Hemoglobin: 11 g/dL — ABNORMAL LOW (ref 13.0–17.0)
Hemoglobin: 11.1 g/dL — ABNORMAL LOW (ref 13.0–17.0)
MCH: 33.8 pg (ref 26.0–34.0)
MCH: 34.3 pg — ABNORMAL HIGH (ref 26.0–34.0)
MCHC: 33.6 g/dL (ref 30.0–36.0)
MCHC: 34 g/dL (ref 30.0–36.0)
MCV: 100.6 fL — ABNORMAL HIGH (ref 80.0–100.0)
MCV: 100.6 fL — ABNORMAL HIGH (ref 80.0–100.0)
Platelets: 104 10*3/uL — ABNORMAL LOW (ref 150–400)
Platelets: 121 10*3/uL — ABNORMAL LOW (ref 150–400)
RBC: 3.25 MIL/uL — ABNORMAL LOW (ref 4.22–5.81)
RBC: 3.24 MIL/uL — ABNORMAL LOW (ref 4.22–5.81)
RDW: 12.9 % (ref 11.5–15.5)
RDW: 12.9 % (ref 11.5–15.5)
WBC: 14.8 10*3/uL — ABNORMAL HIGH (ref 4.0–10.5)
WBC: 17.4 10*3/uL — ABNORMAL HIGH (ref 4.0–10.5)
nRBC: 0 % (ref 0.0–0.2)
nRBC: 0 % (ref 0.0–0.2)

## 2019-09-11 LAB — BASIC METABOLIC PANEL
Anion gap: 9 (ref 5–15)
Anion gap: 8 (ref 5–15)
BUN: 12 mg/dL (ref 8–23)
BUN: 12 mg/dL (ref 8–23)
CO2: 22 mmol/L (ref 22–32)
CO2: 26 mmol/L (ref 22–32)
Calcium: 8 mg/dL — ABNORMAL LOW (ref 8.9–10.3)
Calcium: 8.2 mg/dL — ABNORMAL LOW (ref 8.9–10.3)
Chloride: 107 mmol/L (ref 98–111)
Chloride: 102 mmol/L (ref 98–111)
Creatinine, Ser: 0.99 mg/dL (ref 0.61–1.24)
Creatinine, Ser: 0.9 mg/dL (ref 0.61–1.24)
GFR calc Af Amer: >60 mL/min (ref >60)
GFR calc Af Amer: >60 mL/min (ref >60)
GFR calc non Af Amer: >60 mL/min (ref >60)
GFR calc non Af Amer: >60 mL/min (ref >60)
Glucose, Bld: 192 mg/dL — ABNORMAL HIGH (ref 70–99)
Glucose, Bld: 191 mg/dL — ABNORMAL HIGH (ref 70–99)
Potassium: 4.1 mmol/L (ref 3.5–5.1)
Potassium: 4 mmol/L (ref 3.5–5.1)
Sodium: 138 mmol/L (ref 135–145)
Sodium: 136 mmol/L (ref 135–145)

## 2019-09-11 LAB — MAGNESIUM
Magnesium: 2.4 mg/dL (ref 1.7–2.4)
Magnesium: 2.1 mg/dL (ref 1.7–2.4)

## 2019-09-11 LAB — POCT I-STAT 7, (LYTES, BLD GAS, ICA,H+H)
Acid-base deficit: 1 mmol/L (ref 0.0–2.0)
Bicarbonate: 24.1 mmol/L (ref 20.0–28.0)
Calcium, Ion: 1.16 mmol/L (ref 1.15–1.40)
HCT: 33 % — ABNORMAL LOW (ref 39.0–52.0)
Hemoglobin: 11.2 g/dL — ABNORMAL LOW (ref 13.0–17.0)
O2 Saturation: 98 %
Patient temperature: 37.6
Potassium: 4.2 mmol/L (ref 3.5–5.1)
Sodium: 139 mmol/L (ref 135–145)
TCO2: 25 mmol/L (ref 22–32)
pCO2 arterial: 44.5 mmHg (ref 32.0–48.0)
pH, Arterial: 7.345 — ABNORMAL LOW (ref 7.350–7.450)
pO2, Arterial: 115 mmHg — ABNORMAL HIGH (ref 83.0–108.0)

## 2019-09-11 LAB — GLUCOSE, CAPILLARY
Glucose-Capillary: 100 mg/dL — ABNORMAL HIGH (ref 70–99)
Glucose-Capillary: 127 mg/dL — ABNORMAL HIGH (ref 70–99)
Glucose-Capillary: 129 mg/dL — ABNORMAL HIGH (ref 70–99)
Glucose-Capillary: 131 mg/dL — ABNORMAL HIGH (ref 70–99)
Glucose-Capillary: 134 mg/dL — ABNORMAL HIGH (ref 70–99)
Glucose-Capillary: 150 mg/dL — ABNORMAL HIGH (ref 70–99)
Glucose-Capillary: 152 mg/dL — ABNORMAL HIGH (ref 70–99)
Glucose-Capillary: 155 mg/dL — ABNORMAL HIGH (ref 70–99)
Glucose-Capillary: 181 mg/dL — ABNORMAL HIGH (ref 70–99)
Glucose-Capillary: 185 mg/dL — ABNORMAL HIGH (ref 70–99)
Glucose-Capillary: 188 mg/dL — ABNORMAL HIGH (ref 70–99)
Glucose-Capillary: 218 mg/dL — ABNORMAL HIGH (ref 70–99)

## 2019-09-11 MED ORDER — INSULIN DETEMIR 100 UNIT/ML ~~LOC~~ SOLN: 30.0000 [IU] | Freq: Every day | SUBCUTANEOUS | Status: DC

## 2019-09-11 MED ORDER — AMIODARONE HCL 200 MG PO TABS
400.0000 mg | ORAL_TABLET | Freq: Two times a day (BID) | ORAL | Status: DC
  Administered 2019-09-11 – 2019-09-15 (×9): 400 mg via ORAL
  Filled 2019-09-11 (×9): qty 2

## 2019-09-11 MED ORDER — DIGOXIN 125 MCG PO TABS
0.1250 mg | ORAL_TABLET | Freq: Every day | ORAL | Status: DC
  Administered 2019-09-11 – 2019-09-15 (×5): 0.125 mg via ORAL
  Filled 2019-09-11 (×5): qty 1

## 2019-09-11 MED ORDER — CHLORHEXIDINE GLUCONATE CLOTH 2 % EX PADS
6.0000 | MEDICATED_PAD | Freq: Every day | CUTANEOUS | Status: DC
  Administered 2019-09-11: 6 via TOPICAL

## 2019-09-11 MED ORDER — POTASSIUM CHLORIDE CRYS ER 20 MEQ PO TBCR
20.0000 meq | EXTENDED_RELEASE_TABLET | Freq: Two times a day (BID) | ORAL | Status: DC
  Administered 2019-09-11 (×2): 20 meq via ORAL
  Filled 2019-09-11 (×3): qty 1

## 2019-09-11 MED ORDER — INSULIN ASPART 100 UNIT/ML ~~LOC~~ SOLN
0.0000 [IU] | SUBCUTANEOUS | Status: DC
  Administered 2019-09-11: 21:00:00 8 [IU] via SUBCUTANEOUS
  Administered 2019-09-11 – 2019-09-12 (×3): 4 [IU] via SUBCUTANEOUS
  Administered 2019-09-12: 8 [IU] via SUBCUTANEOUS

## 2019-09-11 MED ORDER — FUROSEMIDE 40 MG PO TABS
40.0000 mg | ORAL_TABLET | Freq: Every day | ORAL | Status: AC
  Administered 2019-09-12 – 2019-09-15 (×4): 40 mg via ORAL
  Filled 2019-09-11 (×4): qty 1

## 2019-09-11 MED ORDER — INSULIN DETEMIR 100 UNIT/ML ~~LOC~~ SOLN
30.0000 [IU] | Freq: Every day | SUBCUTANEOUS | Status: DC
  Administered 2019-09-11 – 2019-09-15 (×5): 30 [IU] via SUBCUTANEOUS
  Filled 2019-09-11 (×6): qty 0.3

## 2019-09-11 MED ORDER — FUROSEMIDE 10 MG/ML IJ SOLN
40.0000 mg | Freq: Once | INTRAMUSCULAR | Status: AC
  Administered 2019-09-11: 40 mg via INTRAVENOUS
  Filled 2019-09-11: qty 4

## 2019-09-11 MED ORDER — LIVING WELL WITH DIABETES BOOK
Freq: Once | Status: AC
  Administered 2019-09-11: 19:00:00
  Filled 2019-09-11: qty 1

## 2019-09-11 MED ORDER — ENOXAPARIN SODIUM 40 MG/0.4ML ~~LOC~~ SOLN
40.0000 mg | Freq: Every day | SUBCUTANEOUS | Status: DC
  Administered 2019-09-11 – 2019-09-13 (×3): 40 mg via SUBCUTANEOUS
  Filled 2019-09-11 (×3): qty 0.4

## 2019-09-11 MED FILL — Thrombin (Recombinant) For Soln 20000 Unit: CUTANEOUS | Qty: 1 | Status: AC

## 2019-09-11 NOTE — Progress Notes (Signed)
Patient ID: Ryan Vaughan, male   DOB: 1946/06/20, 73 y.o.   MRN: [id number] EVENING ROUNDS NOTE :     [address]       [place] [address]             [phone]                 1 Day Post-Op Procedure(s) (LRB): CORONARY ARTERY BYPASS GRAFTING (CABG) x4 , using left internal mammary artery, and right leg greater saphenous vein harvested endoscopically (N/A) CLIPPING OF ATRIAL APPENDAGE Using AtriCure PRO2 Clip Size 50mm (N/A) TRANSESOPHAGEAL ECHOCARDIOGRAM (TEE) (N/A)  Total Length of Stay:  LOS: 1 day  BP 137/71   Pulse 78   Temp 98.8 F (37.1 C) (Oral)   Resp 17   Ht 6\' 4"  (1.93 m)   Wt 135.1 kg   SpO2 98%   BMI 36.25 kg/m   .Intake/Output      12/10 0701 - 12/11 0700 12/11 0701 - 12/12 0700   P.O. 6 340   I.V. (mL/kg) 3003.1 (22.2) 125.2 (0.9)   Blood 200    IV Piggyback 731.1 150   Total Intake(mL/kg) 3940.2 (29.2) 615.2 (4.6)   Urine (mL/kg/hr) 1415 (0.4) 715 (0.5)   Blood 450    Chest Tube 460 10   Total Output 2325 725   Net +1615.2 -109.8          . sodium chloride 20 mL/hr at 09/11/19 0700  . sodium chloride    . sodium chloride Stopped (09/11/19 1156)  . lactated ringers    . phenylephrine (NEO-SYNEPHRINE) Adult infusion Stopped (09/10/19 1409)     Lab Results  Component Value Date   WBC 17.4 (H) 09/11/2019   HGB 11.1 (L) 09/11/2019   HCT 32.6 (L) 09/11/2019   PLT 121 (L) 09/11/2019   GLUCOSE 192 (H) 09/11/2019   CHOL 141 08/25/2018   TRIG 80 08/25/2018   HDL 41 08/25/2018   LDLCALC 84 08/25/2018   ALT 22 09/08/2019   AST 35 09/08/2019   NA 138 09/11/2019   K 4.1 09/11/2019   CL 107 09/11/2019   CREATININE 0.99 09/11/2019   BUN 12 09/11/2019   CO2 22 09/11/2019   TSH 1.600 08/25/2018   INR 1.4 (H) 09/10/2019   HGBA1C 9.1 (H) 09/08/2019   Stable day Walking in hall 2 laps firstt walk    [place] [phone] Office [phone] 09/11/2019 6:00 PM

## 2019-09-11 NOTE — Progress Notes (Signed)
1 Day Post-Op Procedure(s) (LRB): CORONARY ARTERY BYPASS GRAFTING (CABG) x4 , using left internal mammary artery, and right leg greater saphenous vein harvested endoscopically (N/A) CLIPPING OF ATRIAL APPENDAGE Using AtriCure PRO2 Clip Size 59mm (N/A) TRANSESOPHAGEAL ECHOCARDIOGRAM (TEE) (N/A) Subjective: Only complaint is of his chronic back pain. Slept some, dangled and stood up.  Objective: Vital signs in last 24 hours: Temp:  [96.8 F (36 C)-100.2 F (37.9 C)] 98.8 F (37.1 C) (12/11 0700) Pulse Rate:  [77-91] 77 (12/11 0700) Cardiac Rhythm: A-V Sequential paced (12/11 0400) Resp:  [11-28] 13 (12/11 0700) BP: (113-143)/(60-96) 119/76 (12/11 0600) SpO2:  [94 %-100 %] 97 % (12/11 0700) Arterial Line BP: (112-145)/(52-79) 127/55 (12/11 0700) FiO2 (%):  [40 %-50 %] 40 % (12/10 1724) Weight:  [135.1 kg] 135.1 kg (12/11 0500)  Hemodynamic parameters for last 24 hours: PAP: (24-36)/(11-24) 26/11 CO:  [4.8 L/min-6.5 L/min] 6.5 L/min CI:  [1.8 L/min/m2-2.5 L/min/m2] 2.5 L/min/m2  Intake/Output from previous day: 12/10 0701 - 12/11 0700 In: 3940.2 [P.O.:6; I.V.:3003.1; Blood:200; IV Piggyback:731.1] Out: 2325 [Urine:1415; Blood:450; Chest Tube:460] Intake/Output this shift: No intake/output data recorded.  General appearance: alert and cooperative Neurologic: intact Heart: regular rate and rhythm, S1, S2 normal, no murmur, click, rub or gallop Lungs: clear to auscultation bilaterally Extremities: edema mild Wound: dressing dry  Lab Results: Recent Labs    09/10/19 1652 09/10/19 2006 09/11/19 0241  WBC 15.1*  --  14.8*  HGB 12.1* 11.2* 11.0*  HCT 34.9* 33.0* 32.7*  PLT 122*  --  104*   BMET:  Recent Labs    09/10/19 1652 09/10/19 2006 09/11/19 0241  NA 140 139 138  K 4.2 4.2 4.1  CL 109  --  107  CO2 22  --  22  GLUCOSE 131*  --  192*  BUN 11  --  12  CREATININE 0.73  --  0.99  CALCIUM 8.1*  --  8.0*    PT/INR:  Recent Labs    09/10/19 1400  LABPROT  17.4*  INR 1.4*   ABG    Component Value Date/Time   PHART 7.345 (L) 09/10/2019 2006   HCO3 24.1 09/10/2019 2006   TCO2 25 09/10/2019 2006   ACIDBASEDEF 1.0 09/10/2019 2006   O2SAT 98.0 09/10/2019 2006   CBG (last 3)  Recent Labs    09/11/19 0427 09/11/19 0528 09/11/19 0644  GLUCAP 134* 127* 131*   CXR: left base atelectasis  ECG: sinus 70's, first degree AV block, non -specific changes.  Assessment/Plan: S/P Procedure(s) (LRB): CORONARY ARTERY BYPASS GRAFTING (CABG) x4 , using left internal mammary artery, and right leg greater saphenous vein harvested endoscopically (N/A) CLIPPING OF ATRIAL APPENDAGE Using AtriCure PRO2 Clip Size 5mm (N/A) TRANSESOPHAGEAL ECHOCARDIOGRAM (TEE) (N/A)  POD 1  Hemodynamically stable in sinus rhythm. He has been in chronic atrial fib for years on rate control with digoxin and Coreg, Xarelto for anticoagulation but is back in sinus postop which is surprising. He has large thick atria. Will start oral amio to try to maintain sinus. Continue digoxin at lower dose 0.125, start low dose Lopressor. He has left atrial clip now. Will resume Xarelto at discharge.  Poorly controlled DM with preop Hgb A1c of 9 on Metformin and Amaryl. Probaby needs more attention to diet and physical activity but may end up on insulin at home.  Will start Levemir and SSI for now.  He has chronic DJD of spine and knees that may slow progress.   DC chest tubes, swan, arterial  line. IS, ambulation Start diuresis and KCL.   LOS: 1 day    Gaye Pollack 09/11/2019

## 2019-09-11 NOTE — Discharge Instructions (Signed)

## 2019-09-11 NOTE — Discharge Summary (Signed)
Physician Discharge Summary  Patient ID: Ryan Vaughan MRN: [id number] DOB/AGE: April 25, 1946 73 y.o.  Admit date: 09/10/2019 Discharge date: 09/15/2019  Admission Diagnoses:  Patient Active Problem List   Diagnosis Date Noted  . Abnormal nuclear cardiac imaging test 08/24/2019  . Diabetes mellitus due to underlying condition with unspecified complications ([place]) [id number]  . Gout 01/24/2018  . Osteoarthritis of left knee 01/24/2018  . Carotid artery stenosis 05/14/2017  . H/O carotid endarterectomy 05/14/2017  . 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 Chronic atrial fibrillation ([place]) 05/04/2015  . Dyslipidemia 05/04/2015  . 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 Chronic anticoagulation 04/08/2014  . Essential hypertension 04/08/2014   Discharge Diagnoses:  Patient Active Problem List   Diagnosis Date Noted  . 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 Abnormal nuclear cardiac imaging test 08/24/2019  . Diabetes mellitus due to underlying condition with unspecified complications ([place]) [id number]  . Gout 01/24/2018  . Osteoarthritis of left knee 01/24/2018  . Carotid artery stenosis 05/14/2017  . H/O carotid endarterectomy 05/14/2017  . 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 Chronic atrial fibrillation ([place]) 05/04/2015  . Dyslipidemia 05/04/2015  . 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 Chronic anticoagulation 04/08/2014  . Essential hypertension 04/08/2014   Discharged Condition: good  History of Present Illness:  Mr. Ryan Vaughan is a 73 yo obese white male with known history of Hypertension, Dyslipidemia, Atrial Fibrillation on Xarelto, and known CAD.  He recently presented with complaint of new onset exertional shortness of breath.  This mainly occurred with ambulating up stairs quickly or any type of exertion requiring him to move quickly.  The episodes relieve with rest and have not been associated with chest pain or pressure.  He underwent a stress test on  08/05/2019 which showed a defect in the basal and mid inferior walls consistent with ischemia.  He experienced shortness of breath suring the procedure but EKG was free from changes.  Due to stress test being abnormal he underwent cardiac catheterization on 08/28/2019 which showed severe three vessel CAD.  Echocardiogram was also performed and showed a preserved EF and no significant valvular disease.  It was felt he would best be treated with coronary bypass grafting procedure.  He was referred to [facility] and evaluated by Dr. Cyndia Vaughan on 09/02/2019 at which time he was in agreement the patient would benefit from coronary bypass procedure.  The risks and benefits of the procedure were explained to the patient and he was agreeable to proceed.  Hospital Course:   Mr. Ryan Vaughan presented to [facility] on 09[facility]10/2019.  He was taken to the operating room and underwent CABG x 4 utilizing LIMA to Left Anterior Descending, SVG to Diagonal artery, and Sequential SVG to Distal Right Coronary Artery and Posterior Lateral artery.  He also underwent endoscopic harvest of greater saphenous vein from his right thigh to middle of his calf.  He tolerated the procedure without difficulty and was taken to the SICU in stable condition.  He was extubated the evening of surgery.  During his stay in the SICU the patient's chest tube and arterial lines were removed without difficulty.  Surprisingly post operatively he was in Normal Sinus Rhythm.  He was restarted on home Coreg and Digoxin.  He was started on oral Amiodarone to help try and keep him in NSR.  He has Atrial Fibrillation chronically for some time.  He will be restarted on his home regimen of Xarelto at time  of discharge.  He was treated with Lasix to help with volume overloaded state.  He is a poorly controlled diabetic with a preoperative A1C of 9.0.  He was treated with insulin and once his oral intake improved his was resumed on his home  medications.  He will require close follow up with his primary physician for better glucose control.  He remains in NSR.  He is ambulating with a walker.  He was transferred to the progressive care unit on 09/13/2019.  He continues to make good progress.  He remains in NSR but developed first degree AV block.  He was taken off Digoxin and his Amiodarone dose was decreased.  His pacing wires have been removed without difficulty.  He was mildly hypertensive and was restarted on his home regimen of Cozaar at a reduced dose.  He continues to ambulate.  His incisions are healing without evidence of infection.  He is medically stable for discharge home today.   Significant Diagnostic Studies: angiography:    Widely patent left main coronary artery  Mid LAD contains eccentric 85% stenosis after the first diagonal.  The first diagonal is small and contains ostial 90% stenosis.  A large ramus intermedius arises from the distal left main but is free of significant obstruction.  Circumflex gives origin to a small first obtuse marginal, in the mid vessel there is eccentric 60 to 70% narrowing.  2 smaller obtuse marginals arise distally and supplies collaterals to a large right coronary left ventricular branch which apparently has been totally occluded for greater than 10 years.  The right coronary artery is large.  It is totally occluded in the continuation after a small left ventricular branch arises.  The distal vessel is supplied by collaterals from left circumflex to the right coronary.  The mid to distal right coronary contains 90 to 95% eccentric angulated stenosis that enters a fusiform aneurysm =/> 4.5 mm in diameter).  The native vessel pre and post aneurysm is 3.5 mm.  The PDA is large and graftable.  Overall normal left ventricular function with EF 45 to 50%.  Normal LVEDP.  Treatments: surgery:   Procedure:  1. Median Sternotomy 2. Extracorporeal circulation 3.   Coronary artery bypass  grafting x 4   Left internal mammary graft to the LAD  SVG to diagonal  Sequential SVG to distal RCA and PL  4.   Endoscopic vein harvest from the right leg 5.   Clipping of left atrial appendage  Discharge Exam: Blood pressure (!) 159/69, pulse 64, temperature 98.4 F (36.9 C), temperature source Oral, resp. rate 20, height 6\' 4"  (1.93 m), weight 132.8 kg, SpO2 98 %.   General appearance: alert, cooperative and no distress Heart: regular rate and rhythm Lungs: clear to auscultation bilaterally Abdomen: soft, non-tender; bowel sounds normal; no masses,  no organomegaly Extremities: edema trace Wound: clean and dry, ecchymosis present RLE  Discharge Medications:  The patient has been discharged on:   1.Beta Blocker:  Yes [ X  ]                              No   [   ]                              If No, reason:  2.Ace Inhibitor/ARB: Yes [ X  ]  No  [    ]                                     If No, reason:  3.Statin:   Yes [ X  ]                  No  [   ]                  If No, reason:  4.Ecasa:  Yes  [ X  ]                  No   [   ]                  If No, reason:     Allergies as of 09/15/2019      Reactions   Penicillins Rash   Did it involve swelling of the face/tongue/throat, SOB, or low BP? No Did it involve sudden or severe rash/hives, skin peeling, or any reaction on the inside of your mouth or nose? No Did you need to seek medical attention at a hospital or doctor's office? No When did it last happen?2-3 years If all above answers are "NO", may proceed with cephalosporin use.      Medication List    STOP taking these medications   amLODipine 5 MG tablet Commonly known as: NORVASC   digoxin 0.25 MG tablet Commonly known as: LANOXIN   hydrALAZINE 10 MG tablet Commonly known as: APRESOLINE     TAKE these medications   acetaminophen 500 MG tablet Commonly known as: TYLENOL Take 1,000-1,500 mg  by mouth every 8 (eight) hours as needed for moderate pain or headache.   amiodarone 200 MG tablet Commonly known as: PACERONE Take 1 tablet (200 mg total) by mouth 2 (two) times daily.   aspirin EC 81 MG tablet Take 81 mg by mouth daily.   atorvastatin 80 MG tablet Commonly known as: LIPITOR TAKE 1 TABLET BY MOUTH ONCE DAILY What changed: when to take this   carvedilol 12.5 MG tablet Commonly known as: COREG TAKE 1 TABLET BY MOUTH 2 TIMES DAILY   gabapentin 800 MG tablet Commonly known as: NEURONTIN Take 800 mg by mouth See admin instructions. Take 800 mg at night may take a second dose during the day as needed for pain   glimepiride 4 MG tablet Commonly known as: AMARYL Take 4 mg by mouth at bedtime.   losartan 25 MG tablet Commonly known as: COZAAR Take 1 tablet (25 mg total) by mouth daily. Start taking on: September 16, 2019 What changed:   medication strength  how much to take  when to take this   metFORMIN 1000 MG tablet Commonly known as: GLUCOPHAGE Take 1,000 mg by mouth 2 (two) times daily with a meal.   nitroGLYCERIN 0.4 MG SL tablet Commonly known as: NITROSTAT Place 1 tablet (0.4 mg total) under the tongue every 5 (five) minutes as needed for chest pain.   rivaroxaban 20 MG Tabs tablet Commonly known as: XARELTO Take 1 tablet (20 mg total) by mouth daily. What changed: when to take this   traMADol 50 MG tablet Commonly known as: ULTRAM Take 1-2 tablets (50-100 mg total) by mouth every 4 (four) hours as needed for moderate pain.   Vitamin D (Ergocalciferol) 1.25 MG (50000 UT) Caps capsule Commonly known as:  DRISDOL Take 50,000 Units by mouth every Friday.            Durable Medical Equipment  (From admission, onward)         Start     Ordered   09/14/19 0748  For home use only DME Walker rolling  Once    Question:  Patient needs a walker to treat with the following condition  Answer:  Physical deconditioning   09/14/19 0747          Follow-up Information    Gaye Pollack, MD Follow up on 10/14/2019.   Specialty: Cardiothoracic Surgery Why: Appointment is at 10:30, please get CXR at 10:00 at [place] located on first floor of our office building Contact information: [place] [address] [phone]        [facility] Follow up on 09/23/2019.   Specialty: Cardiothoracic Surgery Why: Appointment is at 11:00 with nurse for suture removal Contact information: [place], [place], Reita Cliche, MD Follow up on 10/05/2019.   Specialty: Cardiology Why: Appointment is at 8:05 Contact information: [address] [place] [address] [phone]           Signed: Ellwood Handler, PA-C  09/15/2019, 10:19 AM

## 2019-09-11 NOTE — Progress Notes (Signed)
Assisted tele visit to patient with daughter.  Reeshemah Nazaryan Ann, RN  

## 2019-09-12 ENCOUNTER — Inpatient Hospital Stay (HOSPITAL_COMMUNITY): Payer: PPO

## 2019-09-12 LAB — BASIC METABOLIC PANEL
Anion gap: 7 (ref 5–15)
BUN: 12 mg/dL (ref 8–23)
CO2: 27 mmol/L (ref 22–32)
Calcium: 8.2 mg/dL — ABNORMAL LOW (ref 8.9–10.3)
Chloride: 103 mmol/L (ref 98–111)
Creatinine, Ser: 0.97 mg/dL (ref 0.61–1.24)
GFR calc Af Amer: >60 mL/min (ref >60)
GFR calc non Af Amer: >60 mL/min (ref >60)
Glucose, Bld: 180 mg/dL — ABNORMAL HIGH (ref 70–99)
Potassium: 4 mmol/L (ref 3.5–5.1)
Sodium: 137 mmol/L (ref 135–145)

## 2019-09-12 LAB — GLUCOSE, CAPILLARY
Glucose-Capillary: 119 mg/dL — ABNORMAL HIGH (ref 70–99)
Glucose-Capillary: 191 mg/dL — ABNORMAL HIGH (ref 70–99)
Glucose-Capillary: 197 mg/dL — ABNORMAL HIGH (ref 70–99)
Glucose-Capillary: 207 mg/dL — ABNORMAL HIGH (ref 70–99)
Glucose-Capillary: 229 mg/dL — ABNORMAL HIGH (ref 70–99)
Glucose-Capillary: 239 mg/dL — ABNORMAL HIGH (ref 70–99)

## 2019-09-12 LAB — CBC
HCT: 31.9 % — ABNORMAL LOW (ref 39.0–52.0)
Hemoglobin: 10.6 g/dL — ABNORMAL LOW (ref 13.0–17.0)
MCH: 33.8 pg (ref 26.0–34.0)
MCHC: 33.2 g/dL (ref 30.0–36.0)
MCV: 101.6 fL — ABNORMAL HIGH (ref 80.0–100.0)
Platelets: 109 10*3/uL — ABNORMAL LOW (ref 150–400)
RBC: 3.14 MIL/uL — ABNORMAL LOW (ref 4.22–5.81)
RDW: 13.1 % (ref 11.5–15.5)
WBC: 15.7 10*3/uL — ABNORMAL HIGH (ref 4.0–10.5)
nRBC: 0 % (ref 0.0–0.2)

## 2019-09-12 MED ORDER — SODIUM CHLORIDE 0.9 % IV SOLN: 250.0000 mL | INTRAVENOUS | Status: DC | PRN

## 2019-09-12 MED ORDER — INSULIN ASPART 100 UNIT/ML ~~LOC~~ SOLN
0.0000 [IU] | Freq: Three times a day (TID) | SUBCUTANEOUS | Status: DC
  Administered 2019-09-12: 4 [IU] via SUBCUTANEOUS
  Administered 2019-09-12 – 2019-09-13 (×3): 8 [IU] via SUBCUTANEOUS
  Administered 2019-09-13: 4 [IU] via SUBCUTANEOUS
  Administered 2019-09-13: 2 [IU] via SUBCUTANEOUS
  Administered 2019-09-13: 8 [IU] via SUBCUTANEOUS
  Administered 2019-09-14: 4 [IU] via SUBCUTANEOUS
  Administered 2019-09-14: 2 [IU] via SUBCUTANEOUS

## 2019-09-12 MED ORDER — TRAMADOL HCL 50 MG PO TABS
50.0000 mg | ORAL_TABLET | ORAL | Status: DC | PRN
  Administered 2019-09-12 – 2019-09-14 (×5): 100 mg via ORAL
  Filled 2019-09-12 (×5): qty 2

## 2019-09-12 MED ORDER — ONDANSETRON HCL 4 MG/2ML IJ SOLN: 4.0000 mg | Freq: Four times a day (QID) | INTRAMUSCULAR | Status: DC | PRN

## 2019-09-12 MED ORDER — ACETAMINOPHEN 325 MG PO TABS: 650.0000 mg | ORAL_TABLET | Freq: Four times a day (QID) | ORAL | Status: DC | PRN

## 2019-09-12 MED ORDER — SODIUM CHLORIDE 0.9% FLUSH
3.0000 mL | Freq: Two times a day (BID) | INTRAVENOUS | Status: DC
  Administered 2019-09-12 – 2019-09-14 (×6): 3 mL via INTRAVENOUS

## 2019-09-12 MED ORDER — ~~LOC~~ CARDIAC SURGERY, PATIENT & FAMILY EDUCATION: Freq: Once | Status: DC

## 2019-09-12 MED ORDER — BISACODYL 10 MG RE SUPP: 10.0000 mg | Freq: Every day | RECTAL | Status: DC | PRN

## 2019-09-12 MED ORDER — CHLORHEXIDINE GLUCONATE CLOTH 2 % EX PADS
6.0000 | MEDICATED_PAD | Freq: Every day | CUTANEOUS | Status: DC
  Administered 2019-09-13: 6 via TOPICAL

## 2019-09-12 MED ORDER — SODIUM CHLORIDE 0.9% FLUSH: 3.0000 mL | INTRAVENOUS | Status: DC | PRN

## 2019-09-12 MED ORDER — ONDANSETRON HCL 4 MG PO TABS
4.0000 mg | ORAL_TABLET | Freq: Four times a day (QID) | ORAL | Status: DC | PRN
  Administered 2019-09-12: 14:00:00 4 mg via ORAL
  Filled 2019-09-12: qty 1

## 2019-09-12 MED ORDER — OXYCODONE HCL 5 MG PO TABS: 5.0000 mg | ORAL_TABLET | ORAL | Status: DC | PRN

## 2019-09-12 MED ORDER — POTASSIUM CHLORIDE CRYS ER 20 MEQ PO TBCR
20.0000 meq | EXTENDED_RELEASE_TABLET | Freq: Every day | ORAL | Status: AC
  Administered 2019-09-12 – 2019-09-14 (×3): 20 meq via ORAL
  Filled 2019-09-12 (×2): qty 1

## 2019-09-12 MED ORDER — ASPIRIN EC 81 MG PO TBEC
81.0000 mg | DELAYED_RELEASE_TABLET | Freq: Every day | ORAL | Status: DC
  Administered 2019-09-12 – 2019-09-15 (×4): 81 mg via ORAL
  Filled 2019-09-12 (×4): qty 1

## 2019-09-12 MED ORDER — CARVEDILOL 12.5 MG PO TABS
12.5000 mg | ORAL_TABLET | Freq: Two times a day (BID) | ORAL | Status: DC
  Administered 2019-09-12 – 2019-09-15 (×7): 12.5 mg via ORAL
  Filled 2019-09-12 (×7): qty 1

## 2019-09-12 MED ORDER — BISACODYL 5 MG PO TBEC: 10.0000 mg | DELAYED_RELEASE_TABLET | Freq: Every day | ORAL | Status: DC | PRN

## 2019-09-12 NOTE — Plan of Care (Signed)
  Problem: Health Behavior/Discharge Planning: Goal: Ability to manage health-related needs will improve Outcome: Progressing   Problem: Nutrition: Goal: Adequate nutrition will be maintained Outcome: Progressing   Problem: Coping: Goal: Level of anxiety will decrease Outcome: Progressing   

## 2019-09-12 NOTE — Progress Notes (Signed)
Patient ID: Ryan Vaughan, male   DOB: 11-16-1945, 73 y.o.   MRN: [id number] TCTS DAILY ICU PROGRESS NOTE                   [address]            [place] [address]          [phone]   2 Days Post-Op Procedure(s) (LRB): CORONARY ARTERY BYPASS GRAFTING (CABG) x4 , using left internal mammary artery, and right leg greater saphenous vein harvested endoscopically (N/A) CLIPPING OF ATRIAL APPENDAGE Using AtriCure PRO2 Clip Size 51mm (N/A) TRANSESOPHAGEAL ECHOCARDIOGRAM (TEE) (N/A)  Total Length of Stay:  LOS: 2 days   Subjective: Patient up in chair feels well this morning, Foley still in place , patient was on Xarelto preoperatively, holding sinus rhythm  Objective: Vital signs in last 24 hours: Temp:  [98.8 F (37.1 C)-99.8 F (37.7 C)] 99 F (37.2 C) (12/12 0400) Pulse Rate:  [74-85] 74 (12/12 0800) Cardiac Rhythm: Normal sinus rhythm (12/12 0000) Resp:  [14-19] 16 (12/12 0800) BP: (123-153)/(65-88) 153/71 (12/12 0500) SpO2:  [89 %-98 %] 93 % (12/12 0800) Weight:  [134.6 kg] 134.6 kg (12/12 0500)  Filed Weights   09/10/19 0615 09/11/19 0500 09/12/19 0500  Weight: 133.4 kg 135.1 kg 134.6 kg    Weight change: -0.5 kg   Hemodynamic parameters for last 24 hours:    Intake/Output from previous day: 12/11 0701 - 12/12 0700 In: 615.2 [P.O.:340; I.V.:125.2; IV Piggyback:150] Out: 1935 [Urine:1925; Chest Tube:10]  Intake/Output this shift: No intake/output data recorded.  Current Meds: Scheduled Meds: . acetaminophen  1,000 mg Oral Q6H   Or  . acetaminophen (TYLENOL) oral liquid 160 mg/5 mL  1,000 mg Per Tube Q6H  . amiodarone  400 mg Oral BID  . aspirin EC  325 mg Oral Daily   Or  . aspirin  324 mg Per Tube Daily  . atorvastatin  80 mg Oral q1800  . bisacodyl  10 mg Oral Daily   Or  . bisacodyl  10 mg Rectal Daily  . Chlorhexidine Gluconate Cloth  6 each Topical Q0600  . digoxin  0.125 mg Oral Daily  . docusate sodium  200 mg Oral Daily  .  enoxaparin (LOVENOX) injection  40 mg Subcutaneous QHS  . furosemide  40 mg Oral Daily  . insulin aspart  0-24 Units Subcutaneous Q4H  . insulin detemir  30 Units Subcutaneous Daily  . mouth rinse  15 mL Mouth Rinse BID  . metoprolol tartrate  12.5 mg Oral BID   Or  . metoprolol tartrate  12.5 mg Per Tube BID  . pantoprazole  40 mg Oral Daily  . potassium chloride  20 mEq Oral BID  . sodium chloride flush  3 mL Intravenous Q12H   Continuous Infusions: . sodium chloride 20 mL/hr at 09/11/19 0700  . sodium chloride    . sodium chloride Stopped (09/11/19 1156)  . lactated ringers    . phenylephrine (NEO-SYNEPHRINE) Adult infusion Stopped (09/10/19 1409)   PRN Meds:.sodium chloride, dextrose, metoprolol tartrate, morphine injection, ondansetron (ZOFRAN) IV, oxyCODONE, sodium chloride flush, traMADol  General appearance: alert, cooperative and no distress Neurologic: intact Heart: regular rate and rhythm, S1, S2 normal, no murmur, click, rub or gallop Lungs: clear to auscultation bilaterally Abdomen: soft, non-tender; bowel sounds normal; no masses,  no organomegaly Extremities: extremities normal, atraumatic, no cyanosis or edema and Homans sign is negative, no sign of DVT Wound: Sternum stable  Lab Results: CBC: Recent Labs    09/11/19 1715 09/12/19 0410  WBC 17.4* 15.7*  HGB 11.1* 10.6*  HCT 32.6* 31.9*  PLT 121* 109*   BMET:  Recent Labs    09/11/19 1715 09/12/19 0410  NA 136 137  K 4.0 4.0  CL 102 103  CO2 26 27  GLUCOSE 191* 180*  BUN 12 12  CREATININE 0.90 0.97  CALCIUM 8.2* 8.2*    CMET: Lab Results  Component Value Date   WBC 15.7 (H) 09/12/2019   HGB 10.6 (L) 09/12/2019   HCT 31.9 (L) 09/12/2019   PLT 109 (L) 09/12/2019   GLUCOSE 180 (H) 09/12/2019   CHOL 141 08/25/2018   TRIG 80 08/25/2018   HDL 41 08/25/2018   LDLCALC 84 08/25/2018   ALT 22 09/08/2019   AST 35 09/08/2019   NA 137 09/12/2019   K 4.0 09/12/2019   CL 103 09/12/2019    CREATININE 0.97 09/12/2019   BUN 12 09/12/2019   CO2 27 09/12/2019   TSH 1.600 08/25/2018   INR 1.4 (H) 09/10/2019   HGBA1C 9.1 (H) 09/08/2019      PT/INR:  Recent Labs    09/10/19 1400  LABPROT 17.4*  INR 1.4*   Radiology: No results found.   Assessment/Plan: S/P Procedure(s) (LRB): CORONARY ARTERY BYPASS GRAFTING (CABG) x4 , using left internal mammary artery, and right leg greater saphenous vein harvested endoscopically (N/A) CLIPPING OF ATRIAL APPENDAGE Using AtriCure PRO2 Clip Size 40mm (N/A) TRANSESOPHAGEAL ECHOCARDIOGRAM (TEE) (N/A) Mobilize Diuresis Plan for transfer to step-down: see transfer orders Currently with atrial clip in place, holding sinus rhythm, resume Xarelto prior to discharge home and after pacing wires out DC Foley today   Grace Isaac 09/12/2019 9:10 AM

## 2019-09-12 NOTE — Progress Notes (Signed)
Pt ambulated 320 feet with the assistance of a walker. Pt tolerated activity well and is now resting comfortably in the chair.

## 2019-09-13 ENCOUNTER — Inpatient Hospital Stay (HOSPITAL_COMMUNITY): Payer: PPO

## 2019-09-13 LAB — GLUCOSE, CAPILLARY
Glucose-Capillary: 149 mg/dL — ABNORMAL HIGH (ref 70–99)
Glucose-Capillary: 235 mg/dL — ABNORMAL HIGH (ref 70–99)
Glucose-Capillary: 226 mg/dL — ABNORMAL HIGH (ref 70–99)
Glucose-Capillary: 190 mg/dL — ABNORMAL HIGH (ref 70–99)

## 2019-09-13 LAB — BASIC METABOLIC PANEL
Anion gap: 8 (ref 5–15)
BUN: 13 mg/dL (ref 8–23)
CO2: 27 mmol/L (ref 22–32)
Calcium: 8.1 mg/dL — ABNORMAL LOW (ref 8.9–10.3)
Chloride: 101 mmol/L (ref 98–111)
Creatinine, Ser: 0.9 mg/dL (ref 0.61–1.24)
GFR calc Af Amer: >60 mL/min (ref >60)
GFR calc non Af Amer: >60 mL/min (ref >60)
Glucose, Bld: 145 mg/dL — ABNORMAL HIGH (ref 70–99)
Potassium: 4.4 mmol/L (ref 3.5–5.1)
Sodium: 136 mmol/L (ref 135–145)

## 2019-09-13 LAB — CBC
HCT: 30 % — ABNORMAL LOW (ref 39.0–52.0)
Hemoglobin: 10.2 g/dL — ABNORMAL LOW (ref 13.0–17.0)
MCH: 34.5 pg — ABNORMAL HIGH (ref 26.0–34.0)
MCHC: 34 g/dL (ref 30.0–36.0)
MCV: 101.4 fL — ABNORMAL HIGH (ref 80.0–100.0)
Platelets: 105 10*3/uL — ABNORMAL LOW (ref 150–400)
RBC: 2.96 MIL/uL — ABNORMAL LOW (ref 4.22–5.81)
RDW: 12.9 % (ref 11.5–15.5)
WBC: 12.4 10*3/uL — ABNORMAL HIGH (ref 4.0–10.5)
nRBC: 0 % (ref 0.0–0.2)

## 2019-09-13 NOTE — Progress Notes (Signed)
Patient ID: Ryan Vaughan, male   DOB: 21-Dec-1945, 73 y.o.   MRN: [id number] TCTS DAILY ICU PROGRESS NOTE                   [address]            [place] [address]          [phone]   3 Days Post-Op Procedure(s) (LRB): CORONARY ARTERY BYPASS GRAFTING (CABG) x4 , using left internal mammary artery, and right leg greater saphenous vein harvested endoscopically (N/A) CLIPPING OF ATRIAL APPENDAGE Using AtriCure PRO2 Clip Size 68mm (N/A) TRANSESOPHAGEAL ECHOCARDIOGRAM (TEE) (N/A)  Total Length of Stay:  LOS: 3 days   Subjective: Feels well today ,   Objective: Vital signs in last 24 hours: Temp:  [98 F (36.7 C)-99.4 F (37.4 C)] 98.6 F (37 C) (12/13 1150) Pulse Rate:  [64-81] 70 (12/13 1150) Cardiac Rhythm: Normal sinus rhythm (12/13 0900) Resp:  [16-18] 18 (12/13 1150) BP: (109-150)/(65-84) 146/72 (12/13 1150) SpO2:  [93 %-97 %] 95 % (12/13 1150) Weight:  [135.2 kg] 135.2 kg (12/13 0521)  Filed Weights   09/11/19 0500 09/12/19 0500 09/13/19 0521  Weight: 135.1 kg 134.6 kg 135.2 kg    Weight change: 0.617 kg   Hemodynamic parameters for last 24 hours:    Intake/Output from previous day: 12/12 0701 - 12/13 0700 In: 680 [P.O.:680] Out: 825 [Urine:825]  Intake/Output this shift: Total I/O In: 200 [P.O.:200] Out: -   Current Meds: Scheduled Meds: . amiodarone  400 mg Oral BID  . aspirin EC  81 mg Oral Daily  . atorvastatin  80 mg Oral q1800  . carvedilol  12.5 mg Oral BID  . Chlorhexidine Gluconate Cloth  6 each Topical Daily  . [facility], Patient [facility] Family Education   Does not apply Once  . digoxin  0.125 mg Oral Daily  . enoxaparin (LOVENOX) injection  40 mg Subcutaneous QHS  . furosemide  40 mg Oral Daily  . insulin aspart  0-24 Units Subcutaneous TID AC & HS  . insulin detemir  30 Units Subcutaneous Daily  . mouth rinse  15 mL Mouth Rinse BID  . potassium chloride  20 mEq Oral Daily  . sodium chloride flush  3 mL  Intravenous Q12H   Continuous Infusions: . sodium chloride     PRN Meds:.sodium chloride, acetaminophen, bisacodyl **OR** bisacodyl, ondansetron **OR** ondansetron (ZOFRAN) IV, oxyCODONE, sodium chloride flush, traMADol  General appearance: alert, cooperative and no distress Neurologic: intact Heart: regular rate and rhythm, S1, S2 normal, no murmur, click, rub or gallop Lungs: clear to auscultation bilaterally Abdomen: soft, non-tender; bowel sounds normal; no masses,  no organomegaly Extremities: extremities normal, atraumatic, no cyanosis or edema and Homans sign is negative, no sign of DVT Wound: Sternum stable  Lab Results: CBC: Recent Labs    09/12/19 0410 09/13/19 0218  WBC 15.7* 12.4*  HGB 10.6* 10.2*  HCT 31.9* 30.0*  PLT 109* 105*   BMET:  Recent Labs    09/12/19 0410 09/13/19 0218  NA 137 136  K 4.0 4.4  CL 103 101  CO2 27 27  GLUCOSE 180* 145*  BUN 12 13  CREATININE 0.97 0.90  CALCIUM 8.2* 8.1*    CMET: Lab Results  Component Value Date   WBC 12.4 (H) 09/13/2019   HGB 10.2 (L) 09/13/2019   HCT 30.0 (L) 09/13/2019   PLT 105 (L) 09/13/2019   GLUCOSE 145 (H) 09/13/2019  CHOL 141 08/25/2018   TRIG 80 08/25/2018   HDL 41 08/25/2018   LDLCALC 84 08/25/2018   ALT 22 09/08/2019   AST 35 09/08/2019   NA 136 09/13/2019   K 4.4 09/13/2019   CL 101 09/13/2019   CREATININE 0.90 09/13/2019   BUN 13 09/13/2019   CO2 27 09/13/2019   TSH 1.600 08/25/2018   INR 1.4 (H) 09/10/2019   HGBA1C 9.1 (H) 09/08/2019      PT/INR:  Recent Labs    09/10/19 1400  LABPROT 17.4*  INR 1.4*   Radiology: DG Chest 2 View  Result Date: 09/13/2019 CLINICAL DATA:  Postoperative status. EXAM: CHEST - 2 VIEW COMPARISON:  September 12, 2019. FINDINGS: Stable cardiomegaly. No pneumothorax is noted. Right lung is clear. Mild left basilar atelectasis or infiltrate is noted with pleural effusion. Bony thorax is unremarkable. IMPRESSION: Mild left basilar atelectasis or  infiltrate is noted with pleural effusion. No other abnormality seen in the chest. Electronically Signed   By: Marijo Conception M.D.   On: 09/13/2019 08:55     Assessment/Plan: S/P Procedure(s) (LRB): CORONARY ARTERY BYPASS GRAFTING (CABG) x4 , using left internal mammary artery, and right leg greater saphenous vein harvested endoscopically (N/A) CLIPPING OF ATRIAL APPENDAGE Using AtriCure PRO2 Clip Size 7mm (N/A) TRANSESOPHAGEAL ECHOCARDIOGRAM (TEE) (N/A) Mobilize Diuresis Currently with atrial clip in place, holding sinus rhythm, resume Xarelto prior to discharge home and after pacing wires out Foley out yesterday - voiding  Poss home tomorrow    Ryan Vaughan 09/13/2019 11:51 AMPatient ID: Laurine Blazer, male   DOB: [birth year], 73 y.o.   MRN: [id number]

## 2019-09-14 LAB — GLUCOSE, CAPILLARY
Glucose-Capillary: 117 mg/dL — ABNORMAL HIGH (ref 70–99)
Glucose-Capillary: 158 mg/dL — ABNORMAL HIGH (ref 70–99)
Glucose-Capillary: 162 mg/dL — ABNORMAL HIGH (ref 70–99)
Glucose-Capillary: 90 mg/dL (ref 70–99)

## 2019-09-14 MED ORDER — GLIMEPIRIDE 4 MG PO TABS: 4.0000 mg | ORAL_TABLET | Freq: Every day | ORAL | Status: DC

## 2019-09-14 MED ORDER — LACTULOSE 10 GM/15ML PO SOLN
20.0000 g | Freq: Every day | ORAL | Status: DC | PRN
  Administered 2019-09-14: 20 g via ORAL
  Filled 2019-09-14: qty 30

## 2019-09-14 MED ORDER — METFORMIN HCL 500 MG PO TABS
1000.0000 mg | ORAL_TABLET | Freq: Two times a day (BID) | ORAL | Status: DC
  Administered 2019-09-14 – 2019-09-15 (×3): 1000 mg via ORAL
  Filled 2019-09-14 (×3): qty 2

## 2019-09-14 MED ORDER — LOSARTAN POTASSIUM 25 MG PO TABS
25.0000 mg | ORAL_TABLET | Freq: Every day | ORAL | Status: DC
  Administered 2019-09-14 – 2019-09-15 (×2): 25 mg via ORAL
  Filled 2019-09-14 (×2): qty 1

## 2019-09-14 MED ORDER — RIVAROXABAN 20 MG PO TABS: 20.0000 mg | ORAL_TABLET | Freq: Every day | ORAL | Status: DC

## 2019-09-14 MED ORDER — GLIMEPIRIDE 4 MG PO TABS
4.0000 mg | ORAL_TABLET | Freq: Every day | ORAL | Status: DC
  Administered 2019-09-14 – 2019-09-15 (×2): 4 mg via ORAL
  Filled 2019-09-14 (×2): qty 1

## 2019-09-14 MED FILL — Potassium Chloride Inj 2 mEq/ML: INTRAVENOUS | Qty: 40 | Status: AC

## 2019-09-14 MED FILL — Heparin Sodium (Porcine) Inj 1000 Unit/ML: INTRAMUSCULAR | Qty: 30 | Status: AC

## 2019-09-14 MED FILL — Magnesium Sulfate Inj 50%: INTRAMUSCULAR | Qty: 10 | Status: AC

## 2019-09-14 MED FILL — Lidocaine HCl Local Soln Prefilled Syringe 100 MG/5ML (2%): INTRAMUSCULAR | Qty: 5 | Status: AC

## 2019-09-14 MED FILL — Mannitol IV Soln 20%: INTRAVENOUS | Qty: 500 | Status: AC

## 2019-09-14 MED FILL — Sodium Bicarbonate IV Soln 8.4%: INTRAVENOUS | Qty: 50 | Status: AC

## 2019-09-14 MED FILL — Electrolyte-R (PH 7.4) Solution: INTRAVENOUS | Qty: 1000 | Status: AC

## 2019-09-14 NOTE — Progress Notes (Addendum)
[address]       [place] [address]             [phone]      4 Days Post-Op Procedure(s) (LRB): CORONARY ARTERY BYPASS GRAFTING (CABG) x4 , using left internal mammary artery, and right leg greater saphenous vein harvested endoscopically (N/A) CLIPPING OF ATRIAL APPENDAGE Using AtriCure PRO2 Clip Size 88mm (N/A) TRANSESOPHAGEAL ECHOCARDIOGRAM (TEE) (N/A)   Subjective:  Sitting up on side of bed.  He is ambulating with assistance of a rolling walker.  He has not yet moved his bowels but is passing a lot of gas.  His wires were not removed yesterday as ordered.  Objective: Vital signs in last 24 hours: Temp:  [97.6 F (36.4 C)-99 F (37.2 C)] 97.6 F (36.4 C) (12/14 0453) Pulse Rate:  [70-74] 74 (12/13 1940) Cardiac Rhythm: Normal sinus rhythm (12/13 2200) Resp:  [16-18] 18 (12/14 0453) BP: (123-161)/(70-84) 152/72 (12/14 0453) SpO2:  [95 %-98 %] 96 % (12/14 0024) Weight:  [134.5 kg] 134.5 kg (12/14 0453)  Intake/Output from previous day: 12/13 0701 - 12/14 0700 In: 400 [P.O.:400] Out: 600 [Urine:600]  General appearance: alert, cooperative and no distress Heart: regular rate and rhythm Lungs: clear to auscultation bilaterally Abdomen: soft, non-tender; bowel sounds normal; no masses,  no organomegaly Extremities: edema trace Wound: clean and dry, ecchymosis at [facility] site  Lab Results: Recent Labs    09/12/19 0410 09/13/19 0218  WBC 15.7* 12.4*  HGB 10.6* 10.2*  HCT 31.9* 30.0*  PLT 109* 105*   BMET:  Recent Labs    09/12/19 0410 09/13/19 0218  NA 137 136  K 4.0 4.4  CL 103 101  CO2 27 27  GLUCOSE 180* 145*  BUN 12 13  CREATININE 0.97 0.90  CALCIUM 8.2* 8.1*    PT/INR: No results for input(s): LABPROT, INR in the last 72 hours. ABG    Component Value Date/Time   PHART 7.345 (L) 09/10/2019 2006   HCO3 24.1 09/10/2019 2006   TCO2 25 09/10/2019 2006   ACIDBASEDEF 1.0 09/10/2019 2006   O2SAT 98.0 09/10/2019 2006   CBG  (last 3)  Recent Labs    09/13/19 1614 09/13/19 2125 09/14/19 0557  GLUCAP 226* 190* 117*    Assessment/Plan: S/P Procedure(s) (LRB): CORONARY ARTERY BYPASS GRAFTING (CABG) x4 , using left internal mammary artery, and right leg greater saphenous vein harvested endoscopically (N/A) CLIPPING OF ATRIAL APPENDAGE Using AtriCure PRO2 Clip Size 51mm (N/A) TRANSESOPHAGEAL ECHOCARDIOGRAM (TEE) (N/A)  1. CV- H/O Chronic A. Fib, he has been in NSR since surgery- continue Amiodarone, Coreg, Digoxin, he remains hypertensive, will restart Cozaar at reduced dose 2. Pulm- no acute issues, he is off oxygen, continue IS 3. Renal- creatinine has been WNL, weight is 2 lbs above admission, continue Lasix, potassium for now 4. GI- not yet moved bowels, passing gas, will place lactulose prn use 5. DM- sugars remains elevated at times, will restart home oral agents, continue insulin as well 6. Dispo- patient stable, will resume home cozaar at reduced dose for better BP control, will d/c EPW today, will order rolling walker for d/c, will discuss possible d/c with Dr. Cyndia Bent later today, will plan to restart Xarelto tomorrow   LOS: 4 days    Ellwood Handler, PA-C  09/14/2019   Chart reviewed, patient examined, agree with above. He is doing well overall and maintaining sinus rhythm on amio, Coreg and digoxin. Will plan to send home on amio  200 bid, Coreg and digoxin 0.125. Check ECG today for QTc.  Glucose under fair control but not eating that much and still on Levemir 30 units in addition to his po meds. Will change Amaryl to q breakfast instead of qhs and that should work better. He says the dose was recently changed from 2 mg to 4 mg. His Hgb A1c was 9 on admission so control is poor and he will need to follow up with Dr. Delena Bali as outpt. Has not had BM yet but feels like he will today. Will plan to continue mobilization today and send home tomorrow.

## 2019-09-14 NOTE — Care Management Important Message (Signed)
Important Message  Patient Details  Name: RORAN RELF MRN: [id number] Date of Birth: May 12, 1946   Medicare Important Message Given:  Yes     Nicoya Friel 09/14/2019, 1:40 PM

## 2019-09-14 NOTE — Progress Notes (Signed)
Inpatient Diabetes Program Recommendations  AACE/ADA: New Consensus Statement on Inpatient Glycemic Control (2015)  Target Ranges:  Prepandial:   less than 140 mg/dL      Peak postprandial:   less than 180 mg/dL (1-2 hours)      Critically ill patients:  140 - 180 mg/dL   Lab Results  Component Value Date   GLUCAP 158 (H) 09/14/2019   HGBA1C 9.1 (H) 09/08/2019    Review of Glycemic Control Results for Ryan Vaughan, Ryan Vaughan (MRN [id number]) as of 09/14/2019 14:27  Ref. Range 09/13/2019 16:14 09/13/2019 21:25 09/14/2019 05:57 09/14/2019 12:07  Glucose-Capillary Latest Ref Range: 70 - 99 mg/dL 226 (H) 190 (H) 117 (H) 158 (H)   Diabetes history: Type 2 DM Outpatient Diabetes medications: Metformin 1000 mg BID, Amaryl 4 mg QAM Current orders for Inpatient glycemic control: Metformin 1000 mg BID, Amaryl 4 mg QAM, Novolog 0-24 units TID & HS, Levemir 30 untis QD  Inpatient Diabetes Program Recommendations:    Spoke with patient regarding diabetes management. Patient was on orals prior to admission; verified doses. Patient explains that he has recently been off his diet and wants to try dietary modifications.  Reviewed patient's current A1c of 9.1%. Explained what a A1c is and what it measures. Also reviewed goal A1c with patient, importance of good glucose control @ home, and blood sugar goals. Reviewed patho of DM, role of pancreas, impact of orals on glucose, need for continued dietary maintenance, impact of glucose following CABG, current inpatient insulin requirements, vascular changes, target goals and long term commorbidities.  Patient has a meter and has been checking CBGs 2 times per day. Encouraged to add a post prandial measure to capture elevations and reach out to MD.  Discussed insulin plan for discharge, will start with orals and patient will follow up with PCP. Stressed to patient to reach out to PCP if CBG consistently > 170-180 mg/dL. Patient has no further questions at this time.    Thanks, Bronson Curb, MSN, RNC-OB Diabetes Coordinator [phone] (8a-5p)

## 2019-09-14 NOTE — Plan of Care (Signed)
  Problem: Clinical Measurements: Goal: Diagnostic test results will improve Outcome: Progressing Goal: Respiratory complications will improve Outcome: Progressing Goal: Cardiovascular complication will be avoided Outcome: Progressing   Problem: Activity: Goal: Risk for activity intolerance will decrease Outcome: Progressing   Problem: Nutrition: Goal: Adequate nutrition will be maintained Outcome: Progressing   

## 2019-09-14 NOTE — Progress Notes (Signed)
EPW removed per order without difficulty. VSS. Pt educated on 1 hour bedrest. Call lgiht in reach.  Clyde Canterbury, RN

## 2019-09-14 NOTE — TOC Initial Note (Signed)
Transition of Care [facility]) - Initial/Assessment Note    Patient Details  Name: Ryan Vaughan MRN: [id number] Date of Birth: Jan 07, 1946  Transition of Care [facility]) CM/SW Contact:    Carles Collet, RN Phone Number: 09/14/2019, 10:09 AM  Clinical Narrative:                Confirmed w patient at bedside that he will need a RW for DC tomorrow. Notified [employer] liaison to deliver to room.    Expected Discharge Plan: Home/Self Care Barriers to Discharge: Continued Medical Work up   Patient Goals and CMS Choice        Expected Discharge Plan and Services Expected Discharge Plan: Home/Self Care                         DME Arranged: Walker rolling DME Agency: [employer] Date DME Agency Contacted: 09/14/19 Time DME Agency Contacted: 15 Representative spoke with at DME Agency: Left VM w Zack            Prior Living Arrangements/Services                       Activities of Daily Living Home Assistive Devices/Equipment: CBG Meter, Eyeglasses ADL Screening (condition at time of admission) Patient's cognitive ability adequate to safely complete daily activities?: Yes Is the patient deaf or have difficulty hearing?: Yes Does the patient have difficulty seeing, even when wearing glasses/contacts?: No Does the patient have difficulty concentrating, remembering, or making decisions?: No Patient able to express need for assistance with ADLs?: Yes Does the patient have difficulty dressing or bathing?: No Independently performs ADLs?: Yes (appropriate for developmental age) Does the patient have difficulty walking or climbing stairs?: No Weakness of Legs: None Weakness of Arms/Hands: None  Permission Sought/Granted                  Emotional Assessment              Admission diagnosis:  S/P CABG x 4 [Z95.1] Patient Active Problem List   Diagnosis Date Noted  . 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 Abnormal nuclear cardiac imaging test 08/24/2019  . Diabetes mellitus  due to underlying condition with unspecified complications ([place]) [id number]  . Gout 01/24/2018  . Osteoarthritis of left knee 01/24/2018  . Carotid artery stenosis 05/14/2017  . H/O carotid endarterectomy 05/14/2017  . 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 Chronic atrial fibrillation ([place]) 05/04/2015  . Dyslipidemia 05/04/2015  . 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 Chronic anticoagulation 04/08/2014  . Essential hypertension 04/08/2014   PCP:  Nicoletta Dress, MD Pharmacy:   [facility], [place] [address] Phone: [phone] Fax: [phone]     Social Determinants of Health (SDOH) Interventions    Readmission Risk Interventions No flowsheet data found.

## 2019-09-14 NOTE — Progress Notes (Signed)
CARDIAC REHAB PHASE I   PRE:  Rate/Rhythm: 65 SR PACs  BP:  Supine: 150/72  Sitting:   Standing:    SaO2: 97%RA  MODE:  Ambulation: 470 ft   POST:  Rate/Rhythm: 85 SR  BP:  Supine:   Sitting: 183/88, 165/82  Standing:    SaO2: 100%RA 0945-1032 Pt walked 470 ft on RA with rolling walker with steady gait. Encouraged pt to adhere to sternal precautions which he did. To recliner after walk. Tolerated well except BP elevated right after walk. Assessed BP again and lower. Call light in reach.   Graylon Good, RN BSN  09/14/2019 10:29 AM

## 2019-09-15 LAB — GLUCOSE, CAPILLARY: Glucose-Capillary: 113 mg/dL — ABNORMAL HIGH (ref 70–99)

## 2019-09-15 MED ORDER — AMIODARONE HCL 200 MG PO TABS: 200.0000 mg | ORAL_TABLET | Freq: Two times a day (BID) | ORAL | 1 refills | Status: DC

## 2019-09-15 MED ORDER — TRAMADOL HCL 50 MG PO TABS: 50.0000 mg | ORAL_TABLET | ORAL | 0 refills | Status: DC | PRN

## 2019-09-15 MED ORDER — LOSARTAN POTASSIUM 25 MG PO TABS: 25.0000 mg | ORAL_TABLET | Freq: Every day | ORAL | 3 refills | Status: DC

## 2019-09-15 NOTE — Progress Notes (Signed)
      [address]       [place] [address]             [phone]      5 Days Post-Op Procedure(s) (LRB): CORONARY ARTERY BYPASS GRAFTING (CABG) x4 , using left internal mammary artery, and right leg greater saphenous vein harvested endoscopically (N/A) CLIPPING OF ATRIAL APPENDAGE Using AtriCure PRO2 Clip Size 108mm (N/A) TRANSESOPHAGEAL ECHOCARDIOGRAM (TEE) (N/A)   Subjective:  Awoke from sleep.  States doing okay.  He was able to move his bowels yesterday.  + ambulation  Objective: Vital signs in last 24 hours: Temp:  [98.1 F (36.7 C)-98.8 F (37.1 C)] 98.1 F (36.7 C) (12/15 0538) Pulse Rate:  [60-128] 60 (12/15 0538) Cardiac Rhythm: Normal sinus rhythm;Heart block (12/14 1900) Resp:  [17-20] 20 (12/15 0538) BP: (121-186)/(67-91) 149/75 (12/15 0538) SpO2:  [97 %-100 %] 99 % (12/15 0538) Weight:  [132.8 kg] 132.8 kg (12/15 0538)  Intake/Output from previous day: 12/14 0701 - 12/15 0700 In: 720 [P.O.:720] Out: 600 [Urine:600]  General appearance: alert, cooperative and no distress Heart: regular rate and rhythm Lungs: clear to auscultation bilaterally Abdomen: soft, non-tender; bowel sounds normal; no masses,  no organomegaly Extremities: edema trace Wound: clean and dry, ecchymosis present RLE  Lab Results: Recent Labs    09/13/19 0218  WBC 12.4*  HGB 10.2*  HCT 30.0*  PLT 105*   BMET:  Recent Labs    09/13/19 0218  NA 136  K 4.4  CL 101  CO2 27  GLUCOSE 145*  BUN 13  CREATININE 0.90  CALCIUM 8.1*    PT/INR: No results for input(s): LABPROT, INR in the last 72 hours. ABG    Component Value Date/Time   PHART 7.345 (L) 09/10/2019 2006   HCO3 24.1 09/10/2019 2006   TCO2 25 09/10/2019 2006   ACIDBASEDEF 1.0 09/10/2019 2006   O2SAT 98.0 09/10/2019 2006   CBG (last 3)  Recent Labs    09/14/19 1649 09/14/19 2117 09/15/19 0542  GLUCAP 162* 90 113*    Assessment/Plan: S/P Procedure(s) (LRB): CORONARY ARTERY BYPASS  GRAFTING (CABG) x4 , using left internal mammary artery, and right leg greater saphenous vein harvested endoscopically (N/A) CLIPPING OF ATRIAL APPENDAGE Using AtriCure PRO2 Clip Size 62mm (N/A) TRANSESOPHAGEAL ECHOCARDIOGRAM (TEE) (N/A)  1. CV- H/O Chronic A. 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 Pulm- no acute issues, continue IS 3. Renal- weight continues to trend down, will taper lasix over next several days 4. CBGs- blood sugars improved, patients oral medications were just recently adjusted, will continue oral medications at discharge, will need to follow up with PCP if A1c isn't improving, patient may benefit from insulin in the future 5. Dispo- patient stable, Sinus Brady with 1st degree AV block, will discuss discharge medications with Dr. Cyndia Bent, for d/c home today   LOS: 5 days   Ellwood Handler, PA-C  09/15/2019

## 2019-09-15 NOTE — Plan of Care (Signed)
DISCHARGE NOTE HOME Laurine Blazer to be discharged home per MD order. Discussed prescriptions and follow up appointments with the patient. Prescriptions given to patient; medication list explained in detail. Patient verbalized understanding.  Skin clean, dry and intact without evidence of skin break down, no evidence of skin tears noted. IV catheter discontinued intact. Site without signs and symptoms of complications. Dressing and pressure applied. Pt denies pain at the site currently. No complaints noted.  Patient free of lines, drains, and wounds.   An After Visit Summary (AVS) was printed and given to the patient. Patient escorted via wheelchair, and discharged home via private auto.  Stephan Minister, RN

## 2019-09-15 NOTE — Progress Notes (Signed)
1100-1140 Education completed with pt and daughter who voiced understanding. Encouraged staying in the tube and sternal precautions. Handout given. Encouraged IS. Gave walking instructions for ex. Gave heart healthy and diabetic diets and made suggestions for heart healthy eating. Discussed CRP 2 and referred to [place] CRP 2. Pt stated he attended before.  Has walker in room for home use. Graylon Good RN BSN 09/15/2019 11:37 AM

## 2019-09-15 NOTE — TOC Transition Note (Signed)
Transition of Care [facility]) - CM/SW Discharge Note Marvetta Gibbons RN, BSN Transitions of Care Unit 4E- RN Case Manager [phone]   Patient Details  Name: GILLES HUSTEAD MRN: [id number] Date of Birth: [birth year]  Transition of Care [facility]) CM/SW Contact:  Dawayne Patricia, RN Phone Number: 09/15/2019, 12:36 PM   Clinical Narrative:    Pt stable for transition home today, order placed for RW- msg sent to [facility] with [employer] to see if DME had been delivered to room- confirmed DME delivered on 12/14 to room. No further TOC needs noted   Final next level of care: Home/Self Care Barriers to Discharge: No Barriers Identified   Patient Goals and CMS Choice Patient states their goals for this hospitalization and ongoing recovery are:: return home      Discharge Placement             Home self care          Discharge Plan and Services                DME Arranged: Walker rolling DME Agency: [employer] Date DME Agency Contacted: 09/15/19 Time DME Agency Contacted: 1000 Representative spoke with at DME Agency: msg left with Thedore Mins            Social Determinants of Health (SDOH) Interventions     Readmission Risk Interventions No flowsheet data found.

## 2019-09-23 ENCOUNTER — Other Ambulatory Visit: Payer: Self-pay

## 2019-09-23 ENCOUNTER — Ambulatory Visit (INDEPENDENT_AMBULATORY_CARE_PROVIDER_SITE_OTHER): Payer: Self-pay

## 2019-09-23 VITALS — Temp 97.9°F

## 2019-09-23 DIAGNOSIS — Z951 Presence of aortocoronary bypass graft: Secondary | ICD-10-CM

## 2019-09-23 DIAGNOSIS — Z4802 Encounter for removal of sutures: Secondary | ICD-10-CM

## 2019-09-23 NOTE — Progress Notes (Signed)
Pt comes in for suture removal s/p CABG 09/10/19. He is a/o x 4 and w/o complaint. Three chest tube incision sites w/ sutures c/d/i, well approximated and no s/s of infection noted. Removed sutures per standard protocol w/o difficulty. The incision to the far left oozed a trace amount of blood while removing suture, and a thin, superficial break in skin noted. Dr. Cyndia Bent observed and states to cover w/ band aid (no need for steri-strip). Band aid applied. Instructed pt to keep sites clean (w/ mild soap and water) and dry, and to keep clean band aid applied to site(s) with any drainage. To notify office of any s/s of infection or other problems.

## 2019-09-30 ENCOUNTER — Telehealth: Payer: Self-pay

## 2019-09-30 NOTE — Telephone Encounter (Signed)
Pt calls to report onset of swelling in his lips and cheeks several hours ago. Denies difficulty breathing and there is no swelling of his throat. Advised to contact PCP; to call 911 if he develops problems w/ breathing.

## 2019-10-01 DIAGNOSIS — I10 Essential (primary) hypertension: Secondary | ICD-10-CM | POA: Diagnosis not present

## 2019-10-01 DIAGNOSIS — T783XXA Angioneurotic edema, initial encounter: Secondary | ICD-10-CM | POA: Diagnosis not present

## 2019-10-05 ENCOUNTER — Ambulatory Visit (INDEPENDENT_AMBULATORY_CARE_PROVIDER_SITE_OTHER): Payer: PPO | Admitting: Cardiology

## 2019-10-05 ENCOUNTER — Encounter: Payer: Self-pay | Admitting: Cardiology

## 2019-10-05 ENCOUNTER — Other Ambulatory Visit: Payer: Self-pay

## 2019-10-05 VITALS — BP 176/92 | HR 76 | Ht 76.0 in | Wt 284.0 lb

## 2019-10-05 DIAGNOSIS — E088 Diabetes mellitus due to underlying condition with unspecified complications: Secondary | ICD-10-CM

## 2019-10-05 DIAGNOSIS — Z1329 Encounter for screening for other suspected endocrine disorder: Secondary | ICD-10-CM

## 2019-10-05 DIAGNOSIS — Z951 Presence of aortocoronary bypass graft: Secondary | ICD-10-CM

## 2019-10-05 DIAGNOSIS — Z7901 Long term (current) use of anticoagulants: Secondary | ICD-10-CM

## 2019-10-05 DIAGNOSIS — I722 Aneurysm of renal artery: Secondary | ICD-10-CM | POA: Diagnosis not present

## 2019-10-05 DIAGNOSIS — I10 Essential (primary) hypertension: Secondary | ICD-10-CM | POA: Diagnosis not present

## 2019-10-05 DIAGNOSIS — E785 Hyperlipidemia, unspecified: Secondary | ICD-10-CM

## 2019-10-05 DIAGNOSIS — Z9889 Other specified postprocedural states: Secondary | ICD-10-CM | POA: Diagnosis not present

## 2019-10-05 LAB — LIPID PANEL
Chol/HDL Ratio: 2.6 ratio (ref 0.0–5.0)
Cholesterol, Total: 110 mg/dL (ref 100–199)
HDL: 42 mg/dL (ref >39)
LDL Chol Calc (NIH): 54 mg/dL (ref 0–99)
Triglycerides: 63 mg/dL (ref 0–149)
VLDL Cholesterol Cal: 14 mg/dL (ref 5–40)

## 2019-10-05 LAB — CBC
Hematocrit: 33.2 % — ABNORMAL LOW (ref 37.5–51.0)
Hemoglobin: 11.3 g/dL — ABNORMAL LOW (ref 13.0–17.7)
MCH: 32.7 pg (ref 26.6–33.0)
MCHC: 34 g/dL (ref 31.5–35.7)
MCV: 96 fL (ref 79–97)
Platelets: 243 10*3/uL (ref 150–450)
RBC: 3.46 x10E6/uL — ABNORMAL LOW (ref 4.14–5.80)
RDW: 12.1 % (ref 11.6–15.4)
WBC: 8.9 10*3/uL (ref 3.4–10.8)

## 2019-10-05 LAB — BASIC METABOLIC PANEL
BUN/Creatinine Ratio: 22 (ref 10–24)
BUN: 17 mg/dL (ref 8–27)
CO2: 24 mmol/L (ref 20–29)
Calcium: 9.4 mg/dL (ref 8.6–10.2)
Chloride: 103 mmol/L (ref 96–106)
Creatinine, Ser: 0.79 mg/dL (ref 0.76–1.27)
GFR calc Af Amer: 104 mL/min/{1.73_m2} (ref >59)
GFR calc non Af Amer: 90 mL/min/{1.73_m2} (ref >59)
Glucose: 177 mg/dL — ABNORMAL HIGH (ref 65–99)
Potassium: 5 mmol/L (ref 3.5–5.2)
Sodium: 139 mmol/L (ref 134–144)

## 2019-10-05 LAB — TSH: TSH: 1.23 u[IU]/mL (ref 0.450–4.500)

## 2019-10-05 LAB — HEPATIC FUNCTION PANEL
ALT: 7 IU/L (ref 0–44)
AST: 9 IU/L (ref 0–40)
Albumin: 3.9 g/dL (ref 3.7–4.7)
Alkaline Phosphatase: 72 IU/L (ref 39–117)
Bilirubin Total: 0.3 mg/dL (ref 0.0–1.2)
Bilirubin, Direct: 0.11 mg/dL (ref 0.00–0.40)
Total Protein: 7 g/dL (ref 6.0–8.5)

## 2019-10-05 NOTE — Progress Notes (Signed)
Cardiology Office Note:    Date:  10/05/2019   ID:  Ryan Vaughan, DOB [birth year], MRN [id number]  PCP:  Nicoletta Dress, MD  Cardiologist:  Jenean Lindau, MD   Referring MD: Nicoletta Dress, MD    ASSESSMENT:    1. 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 Chronic anticoagulation   4. H/O carotid endarterectomy   5. Dyslipidemia   6. Diabetes mellitus due to underlying condition with unspecified complications ([place])   7. S/P CABG x 4    PLAN:    In order of problems listed above:  1. Coronary artery disease s/p CABG surgery: Secondary prevention stressed to the patient.  Importance of compliance with diet and medication stressed and he vocalized understanding.  Importance of regular exercise stressed.  Is walking on a regular basis.  Weight reduction was stressed. 2. Essential hypertension: Blood pressure is stable.  His losartan was discontinued because of swelling.  He is not taking amlodipine.  He mentions to me that his blood pressure stable at home generally the top number at best is 140.  He will continue current medications. 3. He was initiated on amiodarone and is tolerating it well.  He has stopped that medication because of itching.  I am not sure whether the itching was from the medication.  He has oral steroid tablets.  In view of the fact that he is doing well postop and is in sinus rhythm I would like him to resume his amiodarone beginning this morning.  If he starts itching significantly again I told him to start his prednisone and then we will never put him back on amiodarone he vocalized understanding.  He is in sinus rhythm and he is feeling very well and I want to keep him on the medicine for a few more weeks until he recuperates completely from his surgery. 4. Diabetes mellitus and obesity: Weight reduction stressed diet was emphasized.  He is fasting and will have blood work today and will be seen in follow-up appointment in a month or  earlier if he has any concerns. 5. Extensively review of records of hospitalization was done.  Total time for 30 minutes for this evaluation.   Medication Adjustments/Labs and Tests Ordered: Current medicines are reviewed at length with the patient today.  Concerns regarding medicines are outlined above.  No orders of the defined types were placed in this encounter.  No orders of the defined types were placed in this encounter.    No chief complaint on file.    History of Present Illness:    Ryan Vaughan is a 74 y.o. male.  Patient has past medical history of coronary artery disease, atrial fibrillation, essential hypertension, dyslipidemia and diabetes mellitus.  He has renal artery aneurysm.  The patient mentions to me that he went to [facility] and underwent coronary angiography.  The results are detailed below.  Subsequently underwent coronary artery bypass surgery.  He is recovering from it and has done fine.  Is been a month and is walking on a regular basis.  At the time of my evaluation, the patient is alert awake oriented and in no distress.  Past Medical History:  Diagnosis Date  . Atrial fibrillation ([place])   . Carotid artery disease ([place])   . Coronary artery disease   . Diabetes mellitus without complication ([place])   . Hearing loss    bilateral - no hearing aids  . Hypertension   .  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 Normajean Baxter): no flow identified in right renal artery origin or in known right renal artery aneurysm, patent artery in the region of mid renal artery could represent a collateral vessel, stable > 60% left RA stenosis  . TIA (transient ischemic attack)    due to severe RICA stenosis, s/p right CEA 04/09/14    Past Surgical History:  Procedure Laterality Date  . CAROTID ENDARTERECTOMY Right 04/09/2014  . CLIPPING OF ATRIAL APPENDAGE N/A 09/10/2019   Procedure: CLIPPING OF ATRIAL APPENDAGE Using AtriCure PRO2 Clip Size  51mm;  Surgeon: Gaye Pollack, MD;  Location: [place];  Service: Open Heart Surgery;  Laterality: N/A;  . CORONARY ARTERY BYPASS GRAFT N/A 09/10/2019   Procedure: CORONARY ARTERY BYPASS GRAFTING (CABG) x4 , using left internal mammary artery, and right leg greater saphenous vein harvested endoscopically;  Surgeon: Gaye Pollack, MD;  Location: [place] OR;  Service: Open Heart Surgery;  Laterality: N/A;  . LEFT HEART CATH AND CORONARY ANGIOGRAPHY N/A 08/28/2019   Procedure: LEFT HEART CATH AND CORONARY ANGIOGRAPHY;  Surgeon: Belva Crome, MD;  Location: [place] CV LAB;  Service: Cardiovascular;  Laterality: N/A;  . TEE WITHOUT CARDIOVERSION N/A 09/10/2019   Procedure: TRANSESOPHAGEAL ECHOCARDIOGRAM (TEE);  Surgeon: Gaye Pollack, MD;  Location: [place];  Service: Open Heart Surgery;  Laterality: N/A;    Current Medications: Current Meds  Medication Sig  . acetaminophen (TYLENOL) 500 MG tablet Take 1,000-1,500 mg by mouth every 8 (eight) hours as needed for moderate pain or headache.  Marland Kitchen amiodarone (PACERONE) 200 MG tablet Take 1 tablet (200 mg total) by mouth 2 (two) times daily.  Marland Kitchen amLODipine (NORVASC) 5 MG tablet Take 5 mg by mouth daily.  Marland Kitchen aspirin EC 81 MG tablet Take 81 mg by mouth daily.  Marland Kitchen atorvastatin (LIPITOR) 80 MG tablet TAKE 1 TABLET BY MOUTH ONCE DAILY (Patient taking differently: Take 80 mg by mouth at bedtime. )  . carvedilol (COREG) 12.5 MG tablet TAKE 1 TABLET BY MOUTH 2 TIMES DAILY  . gabapentin (NEURONTIN) 800 MG tablet Take 800 mg by mouth See admin instructions. Take 800 mg at night may take a second dose during the day as needed for pain  . glimepiride (AMARYL) 4 MG tablet Take 4 mg by mouth at bedtime.   Marland Kitchen losartan (COZAAR) 25 MG tablet Take 1 tablet (25 mg total) by mouth daily.  . metFORMIN (GLUCOPHAGE) 1000 MG tablet Take 1,000 mg by mouth 2 (two) times daily with a meal.  . nitroGLYCERIN (NITROSTAT) 0.4 MG SL tablet Place 1 tablet (0.4 mg total) under the tongue  every 5 (five) minutes as needed for chest pain.  . predniSONE (DELTASONE) 10 MG tablet   . rivaroxaban (XARELTO) 20 MG TABS tablet Take 1 tablet (20 mg total) by mouth daily. (Patient taking differently: Take 20 mg by mouth at bedtime. )  . traMADol (ULTRAM) 50 MG tablet Take 1-2 tablets (50-100 mg total) by mouth every 4 (four) hours as needed for moderate pain.  . Vitamin D, Ergocalciferol, (DRISDOL) 1.25 MG (50000 UT) CAPS capsule Take 50,000 Units by mouth every Friday.      Allergies:   Penicillins, Amiodarone, and Losartan   Social History   Socioeconomic History  . Marital status: Married    Spouse name: Not on file  . Number of children: Not on file  . Years of education: Not on file  . Highest education level:  Not on file  Occupational History  . Not on file  Tobacco Use  . Smoking status: Never Smoker  . Smokeless tobacco: Never Used  Substance and Sexual Activity  . Alcohol use: No  . Drug use: No  . Sexual activity: Not on file  Other Topics Concern  . Not on file  Social History Narrative  . Not on file   Social Determinants of Health   Financial Resource Strain:   . Difficulty of Paying Living Expenses: Not on file  Food Insecurity:   . Worried About Charity fundraiser in the Last Year: Not on file  . Ran Out of Food in the Last Year: Not on file  Transportation Needs:   . Lack of Transportation (Medical): Not on file  . Lack of Transportation (Non-Medical): Not on file  Physical Activity:   . Days of Exercise per Week: Not on file  . Minutes of Exercise per Session: Not on file  Stress:   . Feeling of Stress : Not on file  Social Connections:   . Frequency of Communication with Friends and Family: Not on file  . Frequency of Social Gatherings with Friends and Family: Not on file  . Attends Religious Services: Not on file  . Active Member of Clubs or Organizations: Not on file  . Attends Archivist Meetings: Not on file  . Marital Status:  Not on file     Family History: The patient's family history is not on file.  ROS:   Please see the history of present illness.    All other systems reviewed and are negative.  EKGs/Labs/Other Studies Reviewed:    The following studies were reviewed today: Belva Crome, MD Study date: 08/28/19  MyChart Results Release  MyChart Status: Code Exp Results Release  Physicians  Panel Physicians Referring Physician Case Authorizing Physician  Belva Crome, MD (Primary)    Procedures  LEFT HEART CATH AND CORONARY ANGIOGRAPHY  Conclusion   Widely patent left main coronary artery  Mid LAD contains eccentric 85% stenosis after the first diagonal.  The first diagonal is small and contains ostial 90% stenosis.  A large ramus intermedius arises from the distal left main but is free of significant obstruction.  Circumflex gives origin to a small first obtuse marginal, in the mid vessel there is eccentric 60 to 70% narrowing.  2 smaller obtuse marginals arise distally and supplies collaterals to a large right coronary left ventricular branch which apparently has been totally occluded for greater than 10 years.  The right coronary artery is large.  It is totally occluded in the continuation after a small left ventricular branch arises.  The distal vessel is supplied by collaterals from left circumflex to the right coronary.  The mid to distal right coronary contains 90 to 95% eccentric angulated stenosis that enters a fusiform aneurysm =/> 4.5 mm in diameter).  The native vessel pre and post aneurysm is 3.5 mm.  The PDA is large and graftable.  Overall normal left ventricular function with EF 45 to 50%.  Normal LVEDP.  RECOMMENDATIONS:   TCTS consultation for consideration of multivessel grafting.  Significant blood pressure elevation was present this morning because no antihypertensive therapy was taken in the past 24 hours.  IV labetalol was given to improve blood pressure.  He  should take his blood pressure medication as soon as he arrives home.  Xarelto should be resumed later this evening or in a.m. depending upon  Recent Labs: 09/08/2019: ALT 22 09/11/2019: Magnesium 2.1 09/13/2019: BUN 13; Creatinine, Ser 0.90; Hemoglobin 10.2; Platelets 105; Potassium 4.4; Sodium 136  Recent Lipid Panel    Component Value Date/Time   CHOL 141 08/25/2018 0839   TRIG 80 08/25/2018 0839   HDL 41 08/25/2018 0839   CHOLHDL 3.4 08/25/2018 0839   LDLCALC 84 08/25/2018 0839    Physical Exam:    VS:  BP (!) 176/92 (BP Location: Left Arm, Patient Position: Sitting, Cuff Size: Normal)   Pulse 76   Wt 284 lb (128.8 kg)   SpO2 97%   BMI 34.57 kg/m     Wt Readings from Last 3 Encounters:  10/05/19 284 lb (128.8 kg)  09/15/19 292 lb 12.3 oz (132.8 kg)  09/08/19 291 lb 7 oz (132.2 kg)     GEN: Patient is in no acute distress HEENT: Normal NECK: No JVD; No carotid bruits LYMPHATICS: No lymphadenopathy CARDIAC: Hear sounds regular, 2/6 systolic murmur at the apex. RESPIRATORY:  Clear to auscultation without rales, wheezing or rhonchi  ABDOMEN: Soft, non-tender, non-distended MUSCULOSKELETAL:  No edema; No deformity  SKIN: Warm and dry NEUROLOGIC:  Alert and oriented x 3 PSYCHIATRIC:  Normal affect   Signed, Jenean Lindau, MD  10/05/2019 8:32 AM    [facility]

## 2019-10-05 NOTE — Patient Instructions (Signed)
Medication Instructions:  Your physician recommends that you continue on your current medications as directed. Please refer to the Current Medication list given to you today.  *If you need a refill on your cardiac medications before your next appointment, please call your pharmacy*  Lab Work: Your physician recommends that you have a lipid, BMP, CBC, TSH and hepatic drawn today  If you have labs (blood work) drawn today and your tests are completely normal, you will receive your results only by: Marland Kitchen MyChart Message (if you have MyChart) OR . A paper copy in the mail If you have any lab test that is abnormal or we need to change your treatment, we will call you to review the results.  Testing/Procedures: You had an EKG performed today  Follow-Up: At [facility], you and your health needs are our priority.  As part of our continuing mission to provide you with exceptional heart care, we have created designated Provider Care Teams.  These Care Teams include your primary Cardiologist (physician) and Advanced Practice Providers (APPs -  Physician Assistants and Nurse Practitioners) who all work together to provide you with the care you need, when you need it.  Your next appointment:   1 month(s)  The format for your next appointment:   In Person  Provider:   Jyl Heinz, MD

## 2019-10-09 ENCOUNTER — Other Ambulatory Visit: Payer: Self-pay | Admitting: Cardiology

## 2019-10-12 ENCOUNTER — Other Ambulatory Visit: Payer: Self-pay | Admitting: Surgery

## 2019-10-12 DIAGNOSIS — Z951 Presence of aortocoronary bypass graft: Secondary | ICD-10-CM

## 2019-10-14 ENCOUNTER — Other Ambulatory Visit: Payer: Self-pay

## 2019-10-14 ENCOUNTER — Encounter: Payer: Self-pay | Admitting: Surgery

## 2019-10-14 ENCOUNTER — Ambulatory Visit
Admission: RE | Admit: 2019-10-14 | Discharge: 2019-10-14 | Disposition: A | Discharge status: Home / Self Care | Payer: PPO | Source: Ambulatory Visit | Attending: Surgery | Admitting: Surgery

## 2019-10-14 ENCOUNTER — Ambulatory Visit (INDEPENDENT_AMBULATORY_CARE_PROVIDER_SITE_OTHER): Payer: Self-pay | Admitting: Surgery

## 2019-10-14 VITALS — BP 133/76 | HR 61 | Temp 99.0°F | Resp 16 | Ht 76.0 in | Wt 278.1 lb

## 2019-10-14 DIAGNOSIS — I2510 Atherosclerotic heart disease of native coronary artery without angina pectoris: Secondary | ICD-10-CM

## 2019-10-14 DIAGNOSIS — Z951 Presence of aortocoronary bypass graft: Secondary | ICD-10-CM | POA: Diagnosis not present

## 2019-10-14 NOTE — Progress Notes (Signed)
HPI: Patient returns for routine postoperative follow-up having undergone coronary bypass graft surgery x4 on 09/10/2019.  He had a history of permanent atrial fibrillation on Xarelto for anticoagulation with large left and right atria and therefore had a left atrial appendage clip placed. The patient returned to sinus rhythm after restoration of cardiac activity in the operating room.  He was started on oral amiodarone to try to maintain sinus rhythm and this was effective.  Xarelto was resumed at discharge.  He was sent home on losartan for hypertension which she had been on previously.  He said that he developed some facial swelling on 09/30/2019 and was seen by his PCP.  The amiodarone and losartan were stopped and he was started on Norvasc 5 mg daily.  He was treated with a steroid injection and oral prednisone.  He said that within a few hours of the injection he developed a diffuse red rash which subsequently resolved.  He was seen back by Dr. Geraldo Pitter and started back on amiodarone to try to maintain sinus rhythm.  He has had no further problems with facial swelling or rash.  He is no longer taking prednisone.  He has been ambulating daily and walking for 10 to 20 minutes without chest pain or shortness of breath.  Current Outpatient Medications  Medication Sig Dispense Refill  . acetaminophen (TYLENOL) 500 MG tablet Take 1,000-1,500 mg by mouth every 8 (eight) hours as needed for moderate pain or headache.    Marland Kitchen amiodarone (PACERONE) 200 MG tablet Take 1 tablet (200 mg total) by mouth 2 (two) times daily. 60 tablet 1  . amLODipine (NORVASC) 5 MG tablet Take 5 mg by mouth daily.    Marland Kitchen aspirin EC 81 MG tablet Take 81 mg by mouth daily.    Marland Kitchen atorvastatin (LIPITOR) 80 MG tablet TAKE 1 TABLET BY MOUTH ONCE DAILY 90 tablet 1  . carvedilol (COREG) 12.5 MG tablet TAKE 1 TABLET BY MOUTH 2 TIMES DAILY 180 tablet 1  . gabapentin (NEURONTIN) 800 MG tablet Take 800 mg by mouth See admin instructions.  Take 800 mg at night may take a second dose during the day as needed for pain    . glimepiride (AMARYL) 4 MG tablet Take 4 mg by mouth at bedtime.     . metFORMIN (GLUCOPHAGE) 1000 MG tablet Take 1,000 mg by mouth 2 (two) times daily with a meal.    . nitroGLYCERIN (NITROSTAT) 0.4 MG SL tablet Place 1 tablet (0.4 mg total) under the tongue every 5 (five) minutes as needed for chest pain. 30 tablet 4  . rivaroxaban (XARELTO) 20 MG TABS tablet Take 1 tablet (20 mg total) by mouth daily. (Patient taking differently: Take 20 mg by mouth at bedtime. ) 180 tablet 3  . traMADol (ULTRAM) 50 MG tablet Take 1-2 tablets (50-100 mg total) by mouth every 4 (four) hours as needed for moderate pain. 30 tablet 0  . Vitamin D, Ergocalciferol, (DRISDOL) 1.25 MG (50000 UT) CAPS capsule Take 50,000 Units by mouth every Friday.     . predniSONE (DELTASONE) 10 MG tablet      No current facility-administered medications for this visit.    Physical Exam: BP 133/76 (BP Location: Right Arm, Patient Position: Sitting, Cuff Size: Large)   Pulse 61   Temp 99 F (37.2 C) (Oral)   Resp 16   Ht 6\' 4"  (1.93 m)   Wt 278 lb 2 oz (126.2 kg)   SpO2 98% Comment: RA  BMI 33.85 kg/m  He looks well. Cardiac exam shows a regular rate and rhythm with normal heart sounds.  There is no murmur. Lungs are clear. The chest incision is healing well and the sternum is stable. His right leg incision is healing well and there is no lower extremity edema.  Diagnostic Tests:  CLINICAL DATA:  Status post 4 vessel CABG  EXAM: CHEST - 2 VIEW  COMPARISON:  09/13/2019 chest radiograph.  FINDINGS: Intact sternotomy wires. Stable cardiomediastinal silhouette with top-normal heart size. No pneumothorax. No pleural effusion. Lungs appear clear, with no acute consolidative airspace disease and no pulmonary edema.  IMPRESSION: No active cardiopulmonary disease.   Electronically Signed   By: Ilona Sorrel M.D.   On: 10/14/2019  10:35   Impression:  Overall he is making a good recovery following his surgery.  He had a history of permanent atrial fibrillation preoperatively but appears to be maintaining sinus rhythm postoperatively on amiodarone.  Cardiology will decide how long to keep him on amiodarone.  He has already returned to driving a car.  I asked him not to lift anything heavier than 10 pounds for 3 months postoperatively.  He would like to increase his activity and I encouraged him to continue increasing the distance that he is walking.  Plan:  He will continue to follow-up with his PCP and Dr. Geraldo Pitter.  He will return to see me if he has any problems with his incisions.   Gaye Pollack, MD [facility] [phone]

## 2019-10-28 DIAGNOSIS — H40053 Ocular hypertension, bilateral: Secondary | ICD-10-CM | POA: Diagnosis not present

## 2019-11-05 ENCOUNTER — Other Ambulatory Visit: Payer: Self-pay

## 2019-11-05 ENCOUNTER — Encounter: Payer: Self-pay | Admitting: Cardiology

## 2019-11-05 ENCOUNTER — Ambulatory Visit (INDEPENDENT_AMBULATORY_CARE_PROVIDER_SITE_OTHER): Payer: PPO | Admitting: Cardiology

## 2019-11-05 VITALS — BP 172/102 | HR 60 | Resp 99 | Ht 76.0 in | Wt 285.0 lb

## 2019-11-05 DIAGNOSIS — E088 Diabetes mellitus due to underlying condition with unspecified complications: Secondary | ICD-10-CM | POA: Diagnosis not present

## 2019-11-05 DIAGNOSIS — I482 Chronic atrial fibrillation, unspecified: Secondary | ICD-10-CM | POA: Diagnosis not present

## 2019-11-05 DIAGNOSIS — I6529 Occlusion and stenosis of unspecified carotid artery: Secondary | ICD-10-CM | POA: Diagnosis not present

## 2019-11-05 DIAGNOSIS — Z9889 Other specified postprocedural states: Secondary | ICD-10-CM

## 2019-11-05 DIAGNOSIS — E785 Hyperlipidemia, unspecified: Secondary | ICD-10-CM | POA: Diagnosis not present

## 2019-11-05 DIAGNOSIS — Z951 Presence of aortocoronary bypass graft: Secondary | ICD-10-CM

## 2019-11-05 DIAGNOSIS — I10 Essential (primary) hypertension: Secondary | ICD-10-CM | POA: Diagnosis not present

## 2019-11-05 DIAGNOSIS — I722 Aneurysm of renal artery: Secondary | ICD-10-CM

## 2019-11-05 DIAGNOSIS — I2510 Atherosclerotic heart disease of native coronary artery without angina pectoris: Secondary | ICD-10-CM

## 2019-11-05 DIAGNOSIS — Z7901 Long term (current) use of anticoagulants: Secondary | ICD-10-CM | POA: Diagnosis not present

## 2019-11-05 DIAGNOSIS — Z1329 Encounter for screening for other suspected endocrine disorder: Secondary | ICD-10-CM

## 2019-11-05 HISTORY — DX: Atherosclerotic heart disease of native coronary artery without angina pectoris: I25.10

## 2019-11-05 MED ORDER — AMIODARONE HCL 200 MG PO TABS: 200.0000 mg | ORAL_TABLET | Freq: Every day | ORAL | 6 refills | Status: DC

## 2019-11-05 NOTE — Patient Instructions (Signed)
Medication Instructions:  Your physician has recommended you make the following change in your medication:   You need to take Amiodarone 200 mg once daily.  *If you need a refill on your cardiac medications before your next appointment, please call your pharmacy*  Lab Work: You had labs today that included a BMET, CBC, TSH, LFT's and lipids. If you have labs (blood work) drawn today and your tests are completely normal, you will receive your results only by: Marland Kitchen MyChart Message (if you have MyChart) OR . A paper copy in the mail If you have any lab test that is abnormal or we need to change your treatment, we will call you to review the results.  Testing/Procedures: None ordered  Follow-Up: At [facility], you and your health needs are our priority.  As part of our continuing mission to provide you with exceptional heart care, we have created designated Provider Care Teams.  These Care Teams include your primary Cardiologist (physician) and Advanced Practice Providers (APPs -  Physician Assistants and Nurse Practitioners) who all work together to provide you with the care you need, when you need it.  Your next appointment:   3 month(s)  The format for your next appointment:   In Person  Provider:   Jyl Heinz, MD  Other Instructions NA

## 2019-11-05 NOTE — Progress Notes (Signed)
Cardiology Office Note:    Date:  11/05/2019   ID:  Ryan Vaughan, DOB 14-Nov-1945, MRN [id number]  PCP:  Nicoletta Dress, MD  Cardiologist:  Jenean Lindau, MD   Referring MD: Nicoletta Dress, MD    ASSESSMENT:    1. 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 Stenosis of carotid artery, unspecified laterality   3. Chronic atrial fibrillation ([place])   4. Essential hypertension   5. 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 Chronic anticoagulation   7. Dyslipidemia   8. H/O carotid endarterectomy   9. Diabetes mellitus due to underlying condition with unspecified complications ([place])   10. S/P CABG x 4    PLAN:    In order of problems listed above:  1. Coronary artery disease: Secondary prevention stressed with the patient.  Importance of compliance with diet and medication stressed and he vocalized understanding.  He is walking half an hour a day at least 5 days a week 2. Essential hypertension: Blood pressure is elevated today.  He tells me he has an element of whitecoat hypertension.  He tells me that his blood pressure is excellent at home when he checks on a regular basis.  If it goes any lower he has postural hypotension.  We will continue his current medications. 3. Paroxysmal atrial fibrillation: Patient is in sinus rhythm past couple of times and I reviewed the EKGs.  In view of this I will reduce his amiodarone to once daily.  He will be back in 3 months for follow-up appointment. 4. Mixed dyslipidemia and diabetes mellitus.:  Diet was emphasized.  Importance of regular exercise stressed.  He promises to do better.  He will lose more weight.  He will have all blood work today including fasting lipids.  Patient had multiple questions which were answered to his satisfaction.  Total time for this evaluation was 40 minutes.   Medication Adjustments/Labs and Tests Ordered: Current medicines are reviewed at length with the  patient today.  Concerns regarding medicines are outlined above.  No orders of the defined types were placed in this encounter.  No orders of the defined types were placed in this encounter.    Chief Complaint  Patient presents with  . Follow-up    1 Month     History of Present Illness:    Ryan Vaughan is a 74 y.o. male.  Patient has past medical history of coronary artery disease post CABG surgery, essential hypertension, dyslipidemia and diabetes mellitus.  He is post CABG surgery and doing well.  He denies any problems at this time and takes care of activities of daily living.  No chest pain orthopnea or PND.  Is walking half an hour a day without any problems.  At the time of my evaluation, the patient is alert awake oriented and in no distress.  Past Medical History:  Diagnosis Date  . Atrial fibrillation ([place])   . Carotid artery disease ([place])   . Coronary artery disease   . Diabetes mellitus without complication ([place])   . Hearing loss    bilateral - no hearing aids  . Hypertension   . 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 Normajean Baxter): no flow identified in right renal artery origin or in known right renal artery aneurysm, patent artery in the region of mid renal artery could represent a collateral vessel, stable > 60% left RA stenosis  .  TIA (transient ischemic attack)    due to severe RICA stenosis, s/p right CEA 04/09/14    Past Surgical History:  Procedure Laterality Date  . CAROTID ENDARTERECTOMY Right 04/09/2014  . CLIPPING OF ATRIAL APPENDAGE N/A 09/10/2019   Procedure: CLIPPING OF ATRIAL APPENDAGE Using AtriCure PRO2 Clip Size 57mm;  Surgeon: Gaye Pollack, MD;  Location: [place];  Service: Open Heart Surgery;  Laterality: N/A;  . CORONARY ARTERY BYPASS GRAFT N/A 09/10/2019   Procedure: CORONARY ARTERY BYPASS GRAFTING (CABG) x4 , using left internal mammary artery, and right leg greater saphenous vein harvested endoscopically;  Surgeon:  Gaye Pollack, MD;  Location: [facility];  Service: Open Heart Surgery;  Laterality: N/A;  . LEFT HEART CATH AND CORONARY ANGIOGRAPHY N/A 08/28/2019   Procedure: LEFT HEART CATH AND CORONARY ANGIOGRAPHY;  Surgeon: Belva Crome, MD;  Location: [facility];  Service: Cardiovascular;  Laterality: N/A;  . TEE WITHOUT CARDIOVERSION N/A 09/10/2019   Procedure: TRANSESOPHAGEAL ECHOCARDIOGRAM (TEE);  Surgeon: Gaye Pollack, MD;  Location: [facility];  Service: Open Heart Surgery;  Laterality: N/A;    Current Medications: Current Meds  Medication Sig  . acetaminophen (TYLENOL) 500 MG tablet Take 1,000-1,500 mg by mouth every 8 (eight) hours as needed for moderate pain or headache.  Marland Kitchen amiodarone (PACERONE) 200 MG tablet Take 1 tablet (200 mg total) by mouth 2 (two) times daily.  Marland Kitchen amLODipine (NORVASC) 5 MG tablet Take 5 mg by mouth daily.  Marland Kitchen aspirin EC 81 MG tablet Take 81 mg by mouth daily.  Marland Kitchen atorvastatin (LIPITOR) 80 MG tablet TAKE 1 TABLET BY MOUTH ONCE DAILY  . carvedilol (COREG) 12.5 MG tablet TAKE 1 TABLET BY MOUTH 2 TIMES DAILY  . gabapentin (NEURONTIN) 800 MG tablet Take 800 mg by mouth See admin instructions. Take 800 mg at night may take a second dose during the day as needed for pain  . glimepiride (AMARYL) 4 MG tablet Take 4 mg by mouth at bedtime.   . metFORMIN (GLUCOPHAGE) 1000 MG tablet Take 1,000 mg by mouth 2 (two) times daily with a meal.  . nitroGLYCERIN (NITROSTAT) 0.4 MG SL tablet Place 1 tablet (0.4 mg total) under the tongue every 5 (five) minutes as needed for chest pain.  . rivaroxaban (XARELTO) 20 MG TABS tablet Take 1 tablet (20 mg total) by mouth daily. (Patient taking differently: Take 20 mg by mouth at bedtime. )  . Vitamin D, Ergocalciferol, (DRISDOL) 1.25 MG (50000 UT) CAPS capsule Take 50,000 Units by mouth every Friday.      Allergies:   Penicillins, Amiodarone, and Losartan   Social History   Socioeconomic History  . Marital status: Married    Spouse name:  Not on file  . Number of children: Not on file  . Years of education: Not on file  . Highest education level: Not on file  Occupational History  . Not on file  Tobacco Use  . Smoking status: Never Smoker  . Smokeless tobacco: Never Used  Substance and Sexual Activity  . Alcohol use: No  . Drug use: No  . Sexual activity: Not on file  Other Topics Concern  . Not on file  Social History Narrative  . Not on file   Social Determinants of Health   Financial Resource Strain:   . Difficulty of Paying Living Expenses: Not on file  Food Insecurity:   . Worried About Charity fundraiser in the Last Year: Not on file  . Ran  Out of Food in the Last Year: Not on file  Transportation Needs:   . Lack of Transportation (Medical): Not on file  . Lack of Transportation (Non-Medical): Not on file  Physical Activity:   . Days of Exercise per Week: Not on file  . Minutes of Exercise per Session: Not on file  Stress:   . Feeling of Stress : Not on file  Social Connections:   . Frequency of Communication with Friends and Family: Not on file  . Frequency of Social Gatherings with Friends and Family: Not on file  . Attends Religious Services: Not on file  . Active Member of Clubs or Organizations: Not on file  . Attends Archivist Meetings: Not on file  . Marital Status: Not on file     Family History: The patient's family history is not on file.  ROS:   Please see the history of present illness.    All other systems reviewed and are negative.  EKGs/Labs/Other Studies Reviewed:    The following studies were reviewed today: I discussed my findings with the patient at length   Recent Labs: 09/11/2019: Magnesium 2.1 10/05/2019: ALT 7; BUN 17; Creatinine, Ser 0.79; Hemoglobin 11.3; Platelets 243; Potassium 5.0; Sodium 139; TSH 1.230  Recent Lipid Panel    Component Value Date/Time   CHOL 110 10/05/2019 0846   TRIG 63 10/05/2019 0846   HDL 42 10/05/2019 0846   CHOLHDL 2.6  10/05/2019 0846   LDLCALC 54 10/05/2019 0846    Physical Exam:    VS:  BP (!) 172/102   Pulse 60   Resp (!) 99   Ht 6\' 4"  (1.93 m)   Wt 285 lb (129.3 kg)   BMI 34.69 kg/m     Wt Readings from Last 3 Encounters:  11/05/19 285 lb (129.3 kg)  10/14/19 278 lb 2 oz (126.2 kg)  10/05/19 284 lb (128.8 kg)     GEN: Patient is in no acute distress HEENT: Normal NECK: No JVD; No carotid bruits LYMPHATICS: No lymphadenopathy CARDIAC: Hear sounds regular, 2/6 systolic murmur at the apex. RESPIRATORY:  Clear to auscultation without rales, wheezing or rhonchi  ABDOMEN: Soft, non-tender, non-distended MUSCULOSKELETAL:  No edema; No deformity  SKIN: Warm and dry NEUROLOGIC:  Alert and oriented x 3 PSYCHIATRIC:  Normal affect   Signed, Jenean Lindau, MD  11/05/2019 8:34 AM    [place]

## 2019-11-06 DIAGNOSIS — E785 Hyperlipidemia, unspecified: Secondary | ICD-10-CM | POA: Diagnosis not present

## 2019-11-06 DIAGNOSIS — I2510 Atherosclerotic heart disease of native coronary artery without angina pectoris: Secondary | ICD-10-CM | POA: Diagnosis not present

## 2019-11-06 DIAGNOSIS — E1129 Type 2 diabetes mellitus with other diabetic kidney complication: Secondary | ICD-10-CM | POA: Diagnosis not present

## 2019-11-06 DIAGNOSIS — E1165 Type 2 diabetes mellitus with hyperglycemia: Secondary | ICD-10-CM | POA: Diagnosis not present

## 2019-11-06 DIAGNOSIS — I4891 Unspecified atrial fibrillation: Secondary | ICD-10-CM | POA: Diagnosis not present

## 2019-11-06 DIAGNOSIS — I10 Essential (primary) hypertension: Secondary | ICD-10-CM | POA: Diagnosis not present

## 2019-11-06 DIAGNOSIS — M109 Gout, unspecified: Secondary | ICD-10-CM | POA: Diagnosis not present

## 2019-11-06 LAB — HEPATIC FUNCTION PANEL
ALT: 61 IU/L — ABNORMAL HIGH (ref 0–44)
AST: 37 IU/L (ref 0–40)
Albumin: 3.9 g/dL (ref 3.7–4.7)
Alkaline Phosphatase: 66 IU/L (ref 39–117)
Bilirubin Total: 0.5 mg/dL (ref 0.0–1.2)
Bilirubin, Direct: 0.15 mg/dL (ref 0.00–0.40)
Total Protein: 7 g/dL (ref 6.0–8.5)

## 2019-11-06 LAB — BASIC METABOLIC PANEL
BUN/Creatinine Ratio: 13 (ref 10–24)
BUN: 12 mg/dL (ref 8–27)
CO2: 20 mmol/L (ref 20–29)
Calcium: 8.8 mg/dL (ref 8.6–10.2)
Chloride: 107 mmol/L — ABNORMAL HIGH (ref 96–106)
Creatinine, Ser: 0.9 mg/dL (ref 0.76–1.27)
GFR calc Af Amer: 98 mL/min/{1.73_m2} (ref >59)
GFR calc non Af Amer: 85 mL/min/{1.73_m2} (ref >59)
Glucose: 106 mg/dL — ABNORMAL HIGH (ref 65–99)
Potassium: 4.7 mmol/L (ref 3.5–5.2)
Sodium: 140 mmol/L (ref 134–144)

## 2019-11-06 LAB — CBC WITH DIFFERENTIAL/PLATELET
Basophils Absolute: 0.1 10*3/uL (ref 0.0–0.2)
Basos: 1 %
EOS (ABSOLUTE): 0.3 10*3/uL (ref 0.0–0.4)
Eos: 5 %
Hematocrit: 38 % (ref 37.5–51.0)
Hemoglobin: 12.8 g/dL — ABNORMAL LOW (ref 13.0–17.7)
Immature Grans (Abs): 0 10*3/uL (ref 0.0–0.1)
Immature Granulocytes: 1 %
Lymphocytes Absolute: 1.8 10*3/uL (ref 0.7–3.1)
Lymphs: 31 %
MCH: 32.8 pg (ref 26.6–33.0)
MCHC: 33.7 g/dL (ref 31.5–35.7)
MCV: 97 fL (ref 79–97)
Monocytes Absolute: 0.5 10*3/uL (ref 0.1–0.9)
Monocytes: 9 %
Neutrophils Absolute: 3.1 10*3/uL (ref 1.4–7.0)
Neutrophils: 53 %
Platelets: 169 10*3/uL (ref 150–450)
RBC: 3.9 x10E6/uL — ABNORMAL LOW (ref 4.14–5.80)
RDW: 13.2 % (ref 11.6–15.4)
WBC: 5.8 10*3/uL (ref 3.4–10.8)

## 2019-11-06 LAB — LIPID PANEL
Chol/HDL Ratio: 2.8 ratio (ref 0.0–5.0)
Cholesterol, Total: 131 mg/dL (ref 100–199)
HDL: 46 mg/dL (ref >39)
LDL Chol Calc (NIH): 73 mg/dL (ref 0–99)
Triglycerides: 52 mg/dL (ref 0–149)
VLDL Cholesterol Cal: 12 mg/dL (ref 5–40)

## 2019-11-06 LAB — TSH: TSH: 1.83 u[IU]/mL (ref 0.450–4.500)

## 2019-12-08 ENCOUNTER — Other Ambulatory Visit: Payer: Self-pay

## 2019-12-08 ENCOUNTER — Telehealth: Payer: Self-pay | Admitting: Cardiology

## 2019-12-08 MED ORDER — AMIODARONE HCL 100 MG PO TABS: 100.0000 mg | ORAL_TABLET | Freq: Every day | ORAL | 6 refills | Status: DC

## 2019-12-08 NOTE — Telephone Encounter (Signed)
Pt c/o medication issue:  1. Name of Medication: amiodarone (PACERONE) 200 MG tablet  2. How are you currently taking this medication (dosage and times per day)? 1 tablet by mouth daily   3. Are you having a reaction (difficulty breathing--STAT)? No  4. What is your medication issue? Terius is calling stating he tried to go pick up his prescription for amiodarone from the pharmacy and they informed him he would need to get in contact with Dr. Geraldo Pitter before it could be filled. He only has a couple tablets left and is requesting a refill. The patient has an upcoming appointment with Dr. Geraldo Pitter scheduled for 02/15/20. Please advise.

## 2020-02-03 DIAGNOSIS — I2510 Atherosclerotic heart disease of native coronary artery without angina pectoris: Secondary | ICD-10-CM | POA: Diagnosis not present

## 2020-02-03 DIAGNOSIS — E1165 Type 2 diabetes mellitus with hyperglycemia: Secondary | ICD-10-CM | POA: Diagnosis not present

## 2020-02-03 DIAGNOSIS — E785 Hyperlipidemia, unspecified: Secondary | ICD-10-CM | POA: Diagnosis not present

## 2020-02-03 DIAGNOSIS — M109 Gout, unspecified: Secondary | ICD-10-CM | POA: Diagnosis not present

## 2020-02-03 DIAGNOSIS — I4891 Unspecified atrial fibrillation: Secondary | ICD-10-CM | POA: Diagnosis not present

## 2020-02-03 DIAGNOSIS — E1129 Type 2 diabetes mellitus with other diabetic kidney complication: Secondary | ICD-10-CM | POA: Diagnosis not present

## 2020-02-03 DIAGNOSIS — I10 Essential (primary) hypertension: Secondary | ICD-10-CM | POA: Diagnosis not present

## 2020-02-03 DIAGNOSIS — Z79899 Other long term (current) drug therapy: Secondary | ICD-10-CM | POA: Diagnosis not present

## 2020-02-03 DIAGNOSIS — Z125 Encounter for screening for malignant neoplasm of prostate: Secondary | ICD-10-CM | POA: Diagnosis not present

## 2020-02-15 ENCOUNTER — Other Ambulatory Visit: Payer: Self-pay

## 2020-02-15 ENCOUNTER — Ambulatory Visit (INDEPENDENT_AMBULATORY_CARE_PROVIDER_SITE_OTHER): Payer: PPO | Admitting: Cardiology

## 2020-02-15 ENCOUNTER — Encounter: Payer: Self-pay | Admitting: Cardiology

## 2020-02-15 VITALS — BP 162/90 | HR 53 | Ht 76.0 in | Wt 311.0 lb

## 2020-02-15 DIAGNOSIS — Z951 Presence of aortocoronary bypass graft: Secondary | ICD-10-CM | POA: Diagnosis not present

## 2020-02-15 DIAGNOSIS — Z9889 Other specified postprocedural states: Secondary | ICD-10-CM

## 2020-02-15 DIAGNOSIS — I722 Aneurysm of renal artery: Secondary | ICD-10-CM

## 2020-02-15 DIAGNOSIS — E785 Hyperlipidemia, unspecified: Secondary | ICD-10-CM

## 2020-02-15 DIAGNOSIS — I482 Chronic atrial fibrillation, unspecified: Secondary | ICD-10-CM | POA: Diagnosis not present

## 2020-02-15 DIAGNOSIS — I2510 Atherosclerotic heart disease of native coronary artery without angina pectoris: Secondary | ICD-10-CM

## 2020-02-15 DIAGNOSIS — I10 Essential (primary) hypertension: Secondary | ICD-10-CM

## 2020-02-15 DIAGNOSIS — Z7901 Long term (current) use of anticoagulants: Secondary | ICD-10-CM

## 2020-02-15 DIAGNOSIS — E088 Diabetes mellitus due to underlying condition with unspecified complications: Secondary | ICD-10-CM | POA: Diagnosis not present

## 2020-02-15 NOTE — Patient Instructions (Signed)

## 2020-02-15 NOTE — Progress Notes (Signed)
Cardiology Office Note:    Date:  02/15/2020   ID:  Ryan Vaughan, DOB Jun 14, 1946, MRN [id number]  PCP:  Nicoletta Dress, MD  Cardiologist:  Jenean Lindau, MD   Referring MD: Nicoletta Dress, MD    ASSESSMENT:    1. 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 Chronic atrial fibrillation ([facility])   3. Essential hypertension   4. 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 Chronic anticoagulation   6. Diabetes mellitus due to underlying condition with unspecified complications ([place])   7. Dyslipidemia   8. H/O carotid endarterectomy   9. S/P CABG x 4    PLAN:    In order of problems listed above:  1. Coronary artery disease: Secondary prevention stressed with the patient.  Importance of compliance with diet and medication stressed and he vocalized understanding. 2. Essential hypertension: Blood pressure stable.  He has an element of whitecoat hypertension his blood pressure is fine at home 3. Mixed dyslipidemia: Diet was emphasized weight reduction was stressed he had lab works at his primary care physician and we will get a copy. 4. Diabetes mellitus and obesity: Weight reduction was stressed and he promises to do better.  He is going to start walking and dieting more regularly. 5. Patient will be seen in follow-up appointment in 6 months or earlier if the patient has any concerns    Medication Adjustments/Labs and Tests Ordered: Current medicines are reviewed at length with the patient today.  Concerns regarding medicines are outlined above.  No orders of the defined types were placed in this encounter.  No orders of the defined types were placed in this encounter.    Chief Complaint  Patient presents with  . Follow-up     History of Present Illness:    Ryan Vaughan is a 74 y.o. male.  Patient has past medical history of coronary artery disease post CABG surgery, essential hypertension dyslipidemia and diabetes  mellitus.  He denies any problems at this time and takes care of activities of daily living.  No chest pain orthopnea or PND.  He is walking on a regular basis.  At the time of my evaluation, the patient is alert awake oriented and in no distress.  Past Medical History:  Diagnosis Date  . Atrial fibrillation ([place])   . CAD (coronary artery disease) 11/05/2019  . Carotid artery disease ([place])   . Carotid artery stenosis 05/14/2017  . Chronic anticoagulation 04/08/2014  . Chronic atrial fibrillation ([place]) 05/04/2015  . 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 Coronary artery disease   . Diabetes mellitus due to underlying condition with unspecified complications ([place]) [id number]  . Diabetes mellitus without complication ([place])   . Dyslipidemia 05/04/2015  . Essential hypertension 04/08/2014  . Gout 01/24/2018  . H/O carotid endarterectomy 05/14/2017  . Hearing loss    bilateral - no hearing aids  . Hypertension   . Osteoarthritis of left knee 01/24/2018  . 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 Normajean Baxter): no flow identified in right renal artery origin or in known right renal artery aneurysm, patent artery in the region of mid renal artery could represent a collateral vessel, stable > 60% left RA stenosis  . 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 TIA (transient ischemic attack)    due to severe RICA stenosis, s/p right CEA 04/09/14    Past Surgical History:  Procedure Laterality  Date  . CAROTID ENDARTERECTOMY Right 04/09/2014  . CLIPPING OF ATRIAL APPENDAGE N/A 09/10/2019   Procedure: CLIPPING OF ATRIAL APPENDAGE Using AtriCure PRO2 Clip Size 61mm;  Surgeon: Gaye Pollack, MD;  Location: [facility];  Service: Open Heart Surgery;  Laterality: N/A;  . CORONARY ARTERY BYPASS GRAFT N/A 09/10/2019   Procedure: CORONARY ARTERY BYPASS GRAFTING (CABG) x4 , using left internal mammary artery, and right leg greater saphenous vein harvested endoscopically;  Surgeon: Gaye Pollack, MD;   Location: [facility];  Service: Open Heart Surgery;  Laterality: N/A;  . LEFT HEART CATH AND CORONARY ANGIOGRAPHY N/A 08/28/2019   Procedure: LEFT HEART CATH AND CORONARY ANGIOGRAPHY;  Surgeon: Belva Crome, MD;  Location: [facility];  Service: Cardiovascular;  Laterality: N/A;  . TEE WITHOUT CARDIOVERSION N/A 09/10/2019   Procedure: TRANSESOPHAGEAL ECHOCARDIOGRAM (TEE);  Surgeon: Gaye Pollack, MD;  Location: [facility];  Service: Open Heart Surgery;  Laterality: N/A;    Current Medications: Current Meds  Medication Sig  . acetaminophen (TYLENOL) 500 MG tablet Take 1,000-1,500 mg by mouth every 8 (eight) hours as needed for moderate pain or headache.  Marland Kitchen amiodarone (PACERONE) 100 MG tablet Take 1 tablet (100 mg total) by mouth daily.  Marland Kitchen amLODipine (NORVASC) 5 MG tablet Take 5 mg by mouth daily.  Marland Kitchen aspirin EC 81 MG tablet Take 81 mg by mouth daily.  Marland Kitchen atorvastatin (LIPITOR) 80 MG tablet TAKE 1 TABLET BY MOUTH ONCE DAILY  . carvedilol (COREG) 12.5 MG tablet TAKE 1 TABLET BY MOUTH 2 TIMES DAILY  . gabapentin (NEURONTIN) 800 MG tablet Take 800 mg by mouth See admin instructions. Take 800 mg at night may take a second dose during the day as needed for pain  . glimepiride (AMARYL) 4 MG tablet Take 4 mg by mouth at bedtime.   . metFORMIN (GLUCOPHAGE) 1000 MG tablet Take 1,000 mg by mouth 2 (two) times daily with a meal.  . nitroGLYCERIN (NITROSTAT) 0.4 MG SL tablet Place 1 tablet (0.4 mg total) under the tongue every 5 (five) minutes as needed for chest pain.  . rivaroxaban (XARELTO) 20 MG TABS tablet Take 1 tablet (20 mg total) by mouth daily.  . Vitamin D, Ergocalciferol, (DRISDOL) 1.25 MG (50000 UT) CAPS capsule Take 50,000 Units by mouth every Friday.      Allergies:   Penicillins, Amiodarone, and Losartan   Social History   Socioeconomic History  . Marital status: Married    Spouse name: Not on file  . Number of children: Not on file  . Years of education: Not on file  . Highest  education level: Not on file  Occupational History  . Not on file  Tobacco Use  . Smoking status: Never Smoker  . Smokeless tobacco: Never Used  Substance and Sexual Activity  . Alcohol use: No  . Drug use: No  . Sexual activity: Not on file  Other Topics Concern  . Not on file  Social History Narrative  . Not on file   Social Determinants of Health   Financial Resource Strain:   . Difficulty of Paying Living Expenses:   Food Insecurity:   . Worried About Charity fundraiser in the Last Year:   . Arboriculturist in the Last Year:   Transportation Needs:   . Film/video editor (Medical):   Marland Kitchen Lack of Transportation (Non-Medical):   Physical Activity:   . Days of Exercise per Week:   . Minutes of Exercise  per Session:   Stress:   . Feeling of Stress :   Social Connections:   . Frequency of Communication with Friends and Family:   . Frequency of Social Gatherings with Friends and Family:   . Attends Religious Services:   . Active Member of Clubs or Organizations:   . Attends Archivist Meetings:   Marland Kitchen Marital Status:      Family History: The patient's family history is not on file.  ROS:   Please see the history of present illness.    All other systems reviewed and are negative.  EKGs/Labs/Other Studies Reviewed:    The following studies were reviewed today: EKG reveals sinus rhythm first-degree AV block and nonspecific ST-T changes   Recent Labs: 09/11/2019: Magnesium 2.1 11/05/2019: ALT 61; BUN 12; Creatinine, Ser 0.90; Hemoglobin 12.8; Platelets 169; Potassium 4.7; Sodium 140; TSH 1.830  Recent Lipid Panel    Component Value Date/Time   CHOL 131 11/05/2019 0847   TRIG 52 11/05/2019 0847   HDL 46 11/05/2019 0847   CHOLHDL 2.8 11/05/2019 0847   LDLCALC 73 11/05/2019 0847    Physical Exam:    VS:  BP (!) 162/90 (BP Location: Right Arm, Patient Position: Sitting, Cuff Size: Large)   Ht 6\' 4"  (1.93 m)   Wt (!) 311 lb (141.1 kg)   SpO2 99%    BMI 37.86 kg/m     Wt Readings from Last 3 Encounters:  02/15/20 (!) 311 lb (141.1 kg)  11/05/19 285 lb (129.3 kg)  10/14/19 278 lb 2 oz (126.2 kg)     GEN: Patient is in no acute distress HEENT: Normal NECK: No JVD; No carotid bruits LYMPHATICS: No lymphadenopathy CARDIAC: Hear sounds regular, 2/6 systolic murmur at the apex. RESPIRATORY:  Clear to auscultation without rales, wheezing or rhonchi  ABDOMEN: Soft, non-tender, non-distended MUSCULOSKELETAL:  No edema; No deformity  SKIN: Warm and dry NEUROLOGIC:  Alert and oriented x 3 PSYCHIATRIC:  Normal affect   Signed, Jenean Lindau, MD  02/15/2020 8:42 AM    [facility]

## 2020-03-17 ENCOUNTER — Telehealth: Payer: Self-pay | Admitting: Cardiology

## 2020-03-17 NOTE — Telephone Encounter (Signed)
Pt c/o Shortness Of Breath: STAT if SOB developed within the last 24 hours or pt is noticeably SOB on the phone  1. Are you currently SOB (can you hear that pt is SOB on the phone)? No   2. How long have you been experiencing SOB? Past 3 or 4 days   3. Are you SOB when sitting or when up moving around? Moving around   4. Are you currently experiencing any other symptoms? No

## 2020-03-17 NOTE — Telephone Encounter (Signed)
Pt states that the past few days he notices he is having shortness of breath with walking. Pt states that he has been fine since his CABG in December. Pt denies swelling in his legs, feet or ankles. Pt denies chest pain or pain anywhere. How do you advise?

## 2020-03-17 NOTE — Telephone Encounter (Signed)
Please have him see his primary care physician first.  Also let them do a BNP and a BMP.

## 2020-03-17 NOTE — Telephone Encounter (Signed)
Pt advised of Dr. Julien Nordmann recommendation. Pt verbalized understanding and had no additional questions.

## 2020-03-22 DIAGNOSIS — R0600 Dyspnea, unspecified: Secondary | ICD-10-CM | POA: Diagnosis not present

## 2020-03-23 DIAGNOSIS — J90 Pleural effusion, not elsewhere classified: Secondary | ICD-10-CM | POA: Diagnosis not present

## 2020-03-23 DIAGNOSIS — R0600 Dyspnea, unspecified: Secondary | ICD-10-CM | POA: Diagnosis not present

## 2020-03-23 DIAGNOSIS — R0602 Shortness of breath: Secondary | ICD-10-CM | POA: Diagnosis not present

## 2020-03-29 DIAGNOSIS — I340 Nonrheumatic mitral (valve) insufficiency: Secondary | ICD-10-CM | POA: Diagnosis not present

## 2020-03-29 DIAGNOSIS — R0600 Dyspnea, unspecified: Secondary | ICD-10-CM | POA: Diagnosis not present

## 2020-03-29 DIAGNOSIS — I361 Nonrheumatic tricuspid (valve) insufficiency: Secondary | ICD-10-CM | POA: Diagnosis not present

## 2020-05-02 ENCOUNTER — Other Ambulatory Visit: Payer: Self-pay | Admitting: Cardiology

## 2020-05-11 DIAGNOSIS — E785 Hyperlipidemia, unspecified: Secondary | ICD-10-CM | POA: Diagnosis not present

## 2020-05-11 DIAGNOSIS — E1129 Type 2 diabetes mellitus with other diabetic kidney complication: Secondary | ICD-10-CM | POA: Diagnosis not present

## 2020-05-11 DIAGNOSIS — I4891 Unspecified atrial fibrillation: Secondary | ICD-10-CM | POA: Diagnosis not present

## 2020-05-11 DIAGNOSIS — I10 Essential (primary) hypertension: Secondary | ICD-10-CM | POA: Diagnosis not present

## 2020-05-11 DIAGNOSIS — Z79899 Other long term (current) drug therapy: Secondary | ICD-10-CM | POA: Diagnosis not present

## 2020-05-11 DIAGNOSIS — I2510 Atherosclerotic heart disease of native coronary artery without angina pectoris: Secondary | ICD-10-CM | POA: Diagnosis not present

## 2020-05-11 DIAGNOSIS — E1165 Type 2 diabetes mellitus with hyperglycemia: Secondary | ICD-10-CM | POA: Diagnosis not present

## 2020-05-11 DIAGNOSIS — M109 Gout, unspecified: Secondary | ICD-10-CM | POA: Diagnosis not present

## 2020-07-04 ENCOUNTER — Other Ambulatory Visit: Payer: Self-pay | Admitting: Cardiology

## 2020-08-11 DIAGNOSIS — Z23 Encounter for immunization: Secondary | ICD-10-CM | POA: Diagnosis not present

## 2020-08-11 DIAGNOSIS — E1129 Type 2 diabetes mellitus with other diabetic kidney complication: Secondary | ICD-10-CM | POA: Diagnosis not present

## 2020-08-11 DIAGNOSIS — E1165 Type 2 diabetes mellitus with hyperglycemia: Secondary | ICD-10-CM | POA: Diagnosis not present

## 2020-08-11 DIAGNOSIS — I10 Essential (primary) hypertension: Secondary | ICD-10-CM | POA: Diagnosis not present

## 2020-08-11 DIAGNOSIS — I2510 Atherosclerotic heart disease of native coronary artery without angina pectoris: Secondary | ICD-10-CM | POA: Diagnosis not present

## 2020-08-11 DIAGNOSIS — E785 Hyperlipidemia, unspecified: Secondary | ICD-10-CM | POA: Diagnosis not present

## 2020-08-11 DIAGNOSIS — M109 Gout, unspecified: Secondary | ICD-10-CM | POA: Diagnosis not present

## 2020-08-11 DIAGNOSIS — Z79899 Other long term (current) drug therapy: Secondary | ICD-10-CM | POA: Diagnosis not present

## 2020-08-11 DIAGNOSIS — I4891 Unspecified atrial fibrillation: Secondary | ICD-10-CM | POA: Diagnosis not present

## 2020-09-05 DIAGNOSIS — H40053 Ocular hypertension, bilateral: Secondary | ICD-10-CM | POA: Diagnosis not present

## 2020-11-30 DIAGNOSIS — Z139 Encounter for screening, unspecified: Secondary | ICD-10-CM | POA: Diagnosis not present

## 2020-11-30 DIAGNOSIS — Z9181 History of falling: Secondary | ICD-10-CM | POA: Diagnosis not present

## 2020-11-30 DIAGNOSIS — Z0000 Encounter for general adult medical examination without abnormal findings: Secondary | ICD-10-CM | POA: Diagnosis not present

## 2020-11-30 DIAGNOSIS — E785 Hyperlipidemia, unspecified: Secondary | ICD-10-CM | POA: Diagnosis not present

## 2020-11-30 DIAGNOSIS — Z1331 Encounter for screening for depression: Secondary | ICD-10-CM | POA: Diagnosis not present

## 2020-11-30 DIAGNOSIS — E669 Obesity, unspecified: Secondary | ICD-10-CM | POA: Diagnosis not present

## 2020-12-01 DIAGNOSIS — G459 Transient cerebral ischemic attack, unspecified: Secondary | ICD-10-CM | POA: Insufficient documentation

## 2020-12-01 DIAGNOSIS — I10 Essential (primary) hypertension: Secondary | ICD-10-CM | POA: Insufficient documentation

## 2020-12-01 DIAGNOSIS — H9190 Unspecified hearing loss, unspecified ear: Secondary | ICD-10-CM | POA: Insufficient documentation

## 2020-12-01 DIAGNOSIS — I779 Disorder of arteries and arterioles, unspecified: Secondary | ICD-10-CM | POA: Insufficient documentation

## 2020-12-01 DIAGNOSIS — I4891 Unspecified atrial fibrillation: Secondary | ICD-10-CM | POA: Insufficient documentation

## 2020-12-01 DIAGNOSIS — E119 Type 2 diabetes mellitus without complications: Secondary | ICD-10-CM | POA: Insufficient documentation

## 2020-12-01 DIAGNOSIS — I2510 Atherosclerotic heart disease of native coronary artery without angina pectoris: Secondary | ICD-10-CM | POA: Insufficient documentation

## 2020-12-02 ENCOUNTER — Encounter: Payer: Self-pay | Admitting: Cardiology

## 2020-12-02 ENCOUNTER — Other Ambulatory Visit: Payer: Self-pay

## 2020-12-02 ENCOUNTER — Ambulatory Visit: Payer: PPO | Admitting: Cardiology

## 2020-12-02 VITALS — BP 161/100 | HR 74 | Ht 77.0 in | Wt 307.0 lb

## 2020-12-02 DIAGNOSIS — I4821 Permanent atrial fibrillation: Secondary | ICD-10-CM

## 2020-12-02 DIAGNOSIS — Z951 Presence of aortocoronary bypass graft: Secondary | ICD-10-CM | POA: Diagnosis not present

## 2020-12-02 DIAGNOSIS — I722 Aneurysm of renal artery: Secondary | ICD-10-CM | POA: Diagnosis not present

## 2020-12-02 DIAGNOSIS — I2510 Atherosclerotic heart disease of native coronary artery without angina pectoris: Secondary | ICD-10-CM

## 2020-12-02 DIAGNOSIS — I10 Essential (primary) hypertension: Secondary | ICD-10-CM

## 2020-12-02 DIAGNOSIS — E088 Diabetes mellitus due to underlying condition with unspecified complications: Secondary | ICD-10-CM

## 2020-12-02 HISTORY — DX: Permanent atrial fibrillation: I48.21

## 2020-12-02 MED ORDER — ISOSORBIDE MONONITRATE ER 60 MG PO TB24: 60.0000 mg | ORAL_TABLET | Freq: Every day | ORAL | 3 refills | Status: DC

## 2020-12-02 NOTE — Patient Instructions (Addendum)
Medication Instructions:  Your physician has recommended you make the following change in your medication:   Start Imdur 60 mg daily.  *If you need a refill on your cardiac medications before your next appointment, please call your pharmacy*   Lab Work: None ordered If you have labs (blood work) drawn today and your tests are completely normal, you will receive your results only by: Marland Kitchen MyChart Message (if you have MyChart) OR . A paper copy in the mail If you have any lab test that is abnormal or we need to change your treatment, we will call you to review the results.   Testing/Procedures:  Your physician has requested that you have a lexiscan myoview. For further information please visit [email or web address]. Please follow instruction sheet, as given.  The test will done over 2 days and take approximately 3 to 4 hours each day to complete; you may bring reading material.  If someone comes with you to your appointment, they will need to remain in the main lobby due to limited space in the testing area.  How to prepare for your Myocardial Perfusion Test: . Do not eat or drink 3 hours prior to your test, except you may have water. . Do not consume products containing caffeine (regular or decaffeinated) 12 hours prior to your test. (ex: coffee, chocolate, sodas, tea). . Do bring a list of your current medications with you.  If not listed below, you may take your medications as normal. . Do wear comfortable clothes (no dresses or overalls) and walking shoes, tennis shoes preferred (No heels or open toe shoes are allowed). . Do NOT wear cologne, perfume, aftershave, or lotions (deodorant is allowed). . If these instructions are not followed, your test will have to be rescheduled.    Follow-Up: At [facility], you and your health needs are our priority.  As part of our continuing mission to provide you with exceptional heart care, we have created designated Provider Care Teams.  These  Care Teams include your primary Cardiologist (physician) and Advanced Practice Providers (APPs -  Physician Assistants and Nurse Practitioners) who all work together to provide you with the care you need, when you need it.  We recommend signing up for the patient portal called "MyChart".  Sign up information is provided on this After Visit Summary.  MyChart is used to connect with patients for Virtual Visits (Telemedicine).  Patients are able to view lab/test results, encounter notes, upcoming appointments, etc.  Non-urgent messages can be sent to your provider as well.   To learn more about what you can do with MyChart, go to [email or web address].    Your next appointment:   1 month(s)  The format for your next appointment:   In Person  Provider:   Jyl Heinz, MD   Other Instructions Isosorbide Mononitrate extended-release tablets What is this medicine? ISOSORBIDE MONONITRATE (eye soe SOR bide mon oh NYE trate) is a vasodilator. It relaxes blood vessels, increasing the blood and oxygen supply to your heart. This medicine is used to prevent chest pain caused by angina. It will not help to stop an episode of chest pain. This medicine may be used for other purposes; ask your health care provider or pharmacist if you have questions. COMMON BRAND NAME(S): Imdur, Isotrate ER What should I tell my health care provider before I take this medicine? They need to know if you have any of these conditions:  previous heart attack or heart failure  an unusual or allergic reaction to isosorbide  mononitrate, nitrates, other medicines, foods, dyes, or preservatives  pregnant or trying to get pregnant  breast-feeding How should I use this medicine? Take this medicine by mouth with a glass of water. Follow the directions on the prescription label. Do not crush or chew. Take your medicine at regular intervals. Do not take your medicine more often than directed. Do not stop taking this medicine  except on the advice of your doctor or health care professional. Talk to your pediatrician regarding the use of this medicine in children. Special care may be needed. Overdosage: If you think you have taken too much of this medicine contact a poison control center or emergency room at once. NOTE: This medicine is only for you. Do not share this medicine with others. What if I miss a dose? If you miss a dose, take it as soon as you can. If it is almost time for your next dose, take only that dose. Do not take double or extra doses. What may interact with this medicine? Do not take this medicine with any of the following medications:  medicines used to treat erectile dysfunction (ED) like avanafil, sildenafil, tadalafil, and vardenafil  riociguat This medicine may also interact with the following medications:  medicines for high blood pressure  other medicines for angina or heart failure This list may not describe all possible interactions. Give your health care provider a list of all the medicines, herbs, non-prescription drugs, or dietary supplements you use. Also tell them if you smoke, drink alcohol, or use illegal drugs. Some items may interact with your medicine. What should I watch for while using this medicine? Check your heart rate and blood pressure regularly while you are taking this medicine. Ask your doctor or health care professional what your heart rate and blood pressure should be and when you should contact him or her. Tell your doctor or health care professional if you feel your medicine is no longer working. You may get dizzy. Do not drive, use machinery, or do anything that needs mental alertness until you know how this medicine affects you. To reduce the risk of dizzy or fainting spells, do not sit or stand up quickly, especially if you are an older patient. Alcohol can make you more dizzy, and increase flushing and rapid heartbeats. Avoid alcoholic drinks. Do not treat  yourself for coughs, colds, or pain while you are taking this medicine without asking your doctor or health care professional for advice. Some ingredients may increase your blood pressure. What side effects may I notice from receiving this medicine? Side effects that you should report to your doctor or health care professional as soon as possible:  bluish discoloration of lips, fingernails, or palms of hands  irregular heartbeat, palpitations  low blood pressure  nausea, vomiting  persistent headache  unusually weak or tired Side effects that usually do not require medical attention (report to your doctor or health care professional if they continue or are bothersome):  flushing of the face or neck  rash This list may not describe all possible side effects. Call your doctor for medical advice about side effects. You may report side effects to FDA at 1-800-FDA-1088. Where should I keep my medicine? Keep out of the reach of children. Store between 15 and 30 degrees C (59 and 86 degrees F). Keep container tightly closed. Throw away any unused medicine after the expiration date. NOTE: This sheet is a summary. It may not cover all possible information. If you have questions about  this medicine, talk to your doctor, pharmacist, or health care provider.  2021 Elsevier/Gold Standard (2013-07-17 14:48:19)  Cardiac Nuclear Scan  A cardiac nuclear scan is a test that is done to check the flow of blood to your heart. It is done when you are resting and when you are exercising. The test looks for problems such as:  Not enough blood reaching a portion of the heart.  The heart muscle not working as it should. You may need this test if:  You have heart disease.  You have had lab results that are not normal.  You have had heart surgery or a balloon procedure to open up blocked arteries (angioplasty).  You have chest pain.  You have shortness of breath. In this test, a special dye  (tracer) is put into your bloodstream. The tracer will travel to your heart. A camera will then take pictures of your heart to see how the tracer moves through your heart. This test is usually done at a hospital and takes 2-4 hours. Tell a doctor about:  Any allergies you have.  All medicines you are taking, including vitamins, herbs, eye drops, creams, and over-the-counter medicines.  Any problems you or family members have had with anesthetic medicines.  Any blood disorders you have.  Any surgeries you have had.  Any medical conditions you have.  Whether you are pregnant or may be pregnant. What are the risks? Generally, this is a safe test. However, problems may occur, such as:  Serious chest pain and heart attack. This is only a risk if the stress portion of the test is done.  Rapid heartbeat.  A feeling of warmth in your chest. This feeling usually does not last long.  Allergic reaction to the tracer. What happens before the test?  Ask your doctor about changing or stopping your normal medicines. This is important.  Follow instructions from your doctor about what you cannot eat or drink.  Remove your jewelry on the day of the test. What happens during the test? 1. An IV tube will be inserted into one of your veins. 2. Your doctor will give you a small amount of tracer through the IV tube. 3. You will wait for 20-40 minutes while the tracer moves through your bloodstream. 4. Your heart will be monitored with an electrocardiogram (ECG). 5. You will lie down on an exam table. 6. Pictures of your heart will be taken for about 15-20 minutes. 7. You may also have a stress test. For this test, one of these things may be done: ? You will be asked to exercise on a treadmill or a stationary bike. ? You will be given medicines that will make your heart work harder. This is done if you are unable to exercise. 8. When blood flow to your heart has peaked, a tracer will again be  given through the IV tube. 9. After 20-40 minutes, you will get back on the exam table. More pictures will be taken of your heart. 10. Depending on the tracer that is used, more pictures may need to be taken 3-4 hours later. 11. Your IV tube will be removed when the test is over. The test may vary among doctors and hospitals. What happens after the test? 1. Ask your doctor: ? Whether you can return to your normal schedule, including diet, activities, and medicines. ? Whether you should drink more fluids. This will help to remove the tracer from your body. Drink enough fluid to keep your pee (urine)  pale yellow. 2. Ask your doctor, or the department that is doing the test: ? When will my results be ready? ? How will I get my results? Summary  A cardiac nuclear scan is a test that is done to check the flow of blood to your heart.  Tell your doctor whether you are pregnant or may be pregnant.  Before the test, ask your doctor about changing or stopping your normal medicines. This is important.  Ask your doctor whether you can return to your normal activities. You may be asked to drink more fluids. This information is not intended to replace advice given to you by your health care provider. Make sure you discuss any questions you have with your health care provider. Document Revised: 01/07/2019 Document Reviewed: 03/03/2018 Elsevier Patient Education  [place].

## 2020-12-02 NOTE — Progress Notes (Signed)
Cardiology Office Note:    Date:  12/02/2020   ID:  Ryan Vaughan, DOB September 08, 1946, MRN [id number]  PCP:  Nicoletta Dress, MD  Cardiologist:  Jenean Lindau, MD   Referring MD: Nicoletta Dress, MD    ASSESSMENT:    1. 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 Permanent atrial fibrillation ([place])   3. Essential hypertension   4. 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 Diabetes mellitus due to underlying condition with unspecified complications ([place])   6. S/P CABG x 4    PLAN:    In order of problems listed above:  1. Coronary artery disease: Secondary prevention stressed with the patient.  Importance of compliance with diet medication stressed any vocalized understanding.  Patient has dyspnea on exertion and I will do a Lexiscan sestamibi to assess this.  Echocardiogram from last year was reviewed from [place]. 2. Essential hypertension: Blood pressure is elevated.  Diet was emphasized.  Lifestyle modification urged.  I have added Imdur 60 mg daily to his regimen.  He will keep a log of his blood pressures. 3. Mixed dyslipidemia and diabetes mellitus: Diet was emphasized.  Labs were reviewed from [facility] sheet.  His hemoglobin A1c is elevated and I discussed this with him at length.  He vocalized understanding. 4. Obesity: Diet was stressed.  Weight reduction stressed risks of obesity explained he promises to do better. 5. Permanent atrial fibrillation:I discussed with the patient atrial fibrillation, disease process. Management and therapy including rate and rhythm control, anticoagulation benefits and potential risks were discussed extensively with the patient. Patient had multiple questions which were answered to patient's satisfaction. 6. Follow-up appointment in a month or earlier if has any concerns.   Medication Adjustments/Labs and Tests Ordered: Current medicines are reviewed at length with the patient today.   Concerns regarding medicines are outlined above.  No orders of the defined types were placed in this encounter.  No orders of the defined types were placed in this encounter.    No chief complaint on file.    History of Present Illness:    Ryan Vaughan is a 75 y.o. male.  Patient has past medical history of coronary artery disease post CABG surgery, essential hypertension, dyslipidemia and permanent atrial fibrillation.  He is a diabetic.  He denies any problems at this time except some dyspnea on exertion.  No chest pain orthopnea or PND.  At the time of my evaluation, the patient is alert awake oriented and in no distress.  He leads a sedentary lifestyle.  He does a lot of painting work overall.  Past Medical History:  Diagnosis Date  . Atrial fibrillation ([place])   . CAD (coronary artery disease) 11/05/2019  . Carotid artery disease ([place])   . Carotid artery stenosis 05/14/2017  . Chronic anticoagulation 04/08/2014  . Chronic atrial fibrillation ([place]) 05/04/2015  . 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 Coronary artery disease   . Diabetes mellitus due to underlying condition with unspecified complications ([place]) 50/06/3817  . Diabetes mellitus without complication ([place])   . Dyslipidemia 05/04/2015  . Essential hypertension 04/08/2014  . Gout 01/24/2018  . H/O carotid endarterectomy 05/14/2017  . Hearing loss    bilateral - no hearing aids  . Hypertension   . Osteoarthritis of left knee 01/24/2018  . 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 Normajean Baxter): no flow identified in right renal  artery origin or in known right renal artery aneurysm, patent artery in the region of mid renal artery could represent a collateral vessel, stable > 60% left RA stenosis  . 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 TIA (transient ischemic attack)    due to severe RICA stenosis, s/p right CEA 04/09/14    Past Surgical History:  Procedure Laterality Date  . CAROTID ENDARTERECTOMY Right  04/09/2014  . CLIPPING OF ATRIAL APPENDAGE N/A 09/10/2019   Procedure: CLIPPING OF ATRIAL APPENDAGE Using AtriCure PRO2 Clip Size 41mm;  Surgeon: Gaye Pollack, MD;  Location: [place];  Service: Open Heart Surgery;  Laterality: N/A;  . CORONARY ARTERY BYPASS GRAFT N/A 09/10/2019   Procedure: CORONARY ARTERY BYPASS GRAFTING (CABG) x4 , using left internal mammary artery, and right leg greater saphenous vein harvested endoscopically;  Surgeon: Gaye Pollack, MD;  Location: [place] OR;  Service: Open Heart Surgery;  Laterality: N/A;  . LEFT HEART CATH AND CORONARY ANGIOGRAPHY N/A 08/28/2019   Procedure: LEFT HEART CATH AND CORONARY ANGIOGRAPHY;  Surgeon: Belva Crome, MD;  Location: [place] CV [facility];  Service: Cardiovascular;  Laterality: N/A;  . TEE WITHOUT CARDIOVERSION N/A 09/10/2019   Procedure: TRANSESOPHAGEAL ECHOCARDIOGRAM (TEE);  Surgeon: Gaye Pollack, MD;  Location: [place];  Service: Open Heart Surgery;  Laterality: N/A;    Current Medications: Current Meds  Medication Sig  . acetaminophen (TYLENOL) 500 MG tablet Take 1,000-1,500 mg by mouth every 8 (eight) hours as needed for moderate pain or headache.  Marland Kitchen amLODipine (NORVASC) 5 MG tablet Take 5 mg by mouth daily.  Marland Kitchen aspirin EC 81 MG tablet Take 81 mg by mouth daily.  Marland Kitchen atorvastatin (LIPITOR) 80 MG tablet TAKE 1 TABLET BY MOUTH ONCE DAILY  . carvedilol (COREG) 12.5 MG tablet TAKE 1 TABLET BY MOUTH 2 TIMES DAILY  . gabapentin (NEURONTIN) 800 MG tablet Take 800 mg by mouth See admin instructions. Take 800 mg at night may take a second dose during the day as needed for pain  . glimepiride (AMARYL) 4 MG tablet Take 4 mg by mouth at bedtime.   . metFORMIN (GLUCOPHAGE) 1000 MG tablet Take 1,000 mg by mouth 2 (two) times daily with a meal.  . nitroGLYCERIN (NITROSTAT) 0.4 MG SL tablet Place 1 tablet (0.4 mg total) under the tongue every 5 (five) minutes as needed for chest pain.  . rivaroxaban (XARELTO) 20 MG TABS tablet Take 1 tablet (20 mg  total) by mouth daily.  . Vitamin D, Ergocalciferol, (DRISDOL) 1.25 MG (50000 UT) CAPS capsule Take 50,000 Units by mouth every Friday.      Allergies:   Penicillins, Amiodarone, and Losartan   Social History   Socioeconomic History  . Marital status: Married    Spouse name: Not on file  . Number of children: Not on file  . Years of education: Not on file  . Highest education level: Not on file  Occupational History  . Not on file  Tobacco Use  . Smoking status: Never Smoker  . Smokeless tobacco: Never Used  Vaping Use  . Vaping Use: Never used  Substance and Sexual Activity  . Alcohol use: No  . Drug use: No  . Sexual activity: Not on file  Other Topics Concern  . Not on file  Social History Narrative  . Not on file   Social Determinants of Health   Financial Resource Strain: Not on file  Food Insecurity: Not on file  Transportation Needs: Not on file  Physical Activity: Not on file  Stress: Not on file  Social Connections: Not on file     Family History: The patient's Family history is unknown by patient.  ROS:   Please see the history of present illness.    All other systems reviewed and are negative.  EKGs/Labs/Other Studies Reviewed:    The following studies were reviewed today: EKG reveals atrial fibrillation with well-controlled ventricular rate.   Recent Labs: No results found for requested labs within last 8760 hours.  Recent Lipid Panel    Component Value Date/Time   CHOL 131 11/05/2019 0847   TRIG 52 11/05/2019 0847   HDL 46 11/05/2019 0847   CHOLHDL 2.8 11/05/2019 0847   LDLCALC 73 11/05/2019 0847    Physical Exam:    VS:  BP (!) 161/100   Pulse 74   Ht 6\' 5"  (1.956 m)   Wt (!) 307 lb (139.3 kg)   SpO2 98%   BMI 36.40 kg/m     Wt Readings from Last 3 Encounters:  12/02/20 (!) 307 lb (139.3 kg)  02/15/20 (!) 311 lb (141.1 kg)  11/05/19 285 lb (129.3 kg)     GEN: Patient is in no acute distress HEENT: Normal NECK: No JVD; No  carotid bruits LYMPHATICS: No lymphadenopathy CARDIAC: Hear sounds regular, 2/6 systolic murmur at the apex. RESPIRATORY:  Clear to auscultation without rales, wheezing or rhonchi  ABDOMEN: Soft, non-tender, non-distended MUSCULOSKELETAL:  No edema; No deformity  SKIN: Warm and dry NEUROLOGIC:  Alert and oriented x 3 PSYCHIATRIC:  Normal affect   Signed, Jenean Lindau, MD  12/02/2020 2:52 PM    [facility]

## 2020-12-06 ENCOUNTER — Telehealth: Payer: Self-pay | Admitting: *Deleted

## 2020-12-06 NOTE — Telephone Encounter (Signed)
Patient given detailed instructions per Myocardial Perfusion Study Information Sheet for the test on 12/13/20 at 0815. Patient notified to arrive 15 minutes early and that it is imperative to arrive on time for appointment to keep from having the test rescheduled.  If you need to cancel or reschedule your appointment, please call the office within 24 hours of your appointment. . Patient verbalized understanding.[name], Ranae [place]

## 2020-12-13 ENCOUNTER — Other Ambulatory Visit: Payer: Self-pay

## 2020-12-13 ENCOUNTER — Ambulatory Visit (INDEPENDENT_AMBULATORY_CARE_PROVIDER_SITE_OTHER): Payer: PPO

## 2020-12-13 DIAGNOSIS — E088 Diabetes mellitus due to underlying condition with unspecified complications: Secondary | ICD-10-CM

## 2020-12-13 DIAGNOSIS — I2510 Atherosclerotic heart disease of native coronary artery without angina pectoris: Secondary | ICD-10-CM | POA: Diagnosis not present

## 2020-12-13 DIAGNOSIS — Z951 Presence of aortocoronary bypass graft: Secondary | ICD-10-CM

## 2020-12-13 MED ORDER — REGADENOSON 0.4 MG/5ML IV SOLN
0.4000 mg | Freq: Once | INTRAVENOUS | Status: AC
  Administered 2020-12-13: 0.4 mg via INTRAVENOUS

## 2020-12-13 MED ORDER — TECHNETIUM TC 99M TETROFOSMIN IV KIT
32.3000 | PACK | Freq: Once | INTRAVENOUS | Status: AC | PRN
  Administered 2020-12-13: 32.3 via INTRAVENOUS

## 2020-12-14 ENCOUNTER — Ambulatory Visit: Payer: PPO

## 2020-12-14 MED ORDER — TECHNETIUM TC 99M TETROFOSMIN IV KIT
32.6000 | PACK | Freq: Once | INTRAVENOUS | Status: AC | PRN
  Administered 2020-12-14: 32.6 via INTRAVENOUS

## 2020-12-15 LAB — MYOCARDIAL PERFUSION IMAGING
LV dias vol: 196 mL (ref 62–150)
LV sys vol: 116 mL
Peak HR: 83 {beats}/min
Rest HR: 73 {beats}/min
SDS: 3
SRS: 1
SSS: 4
TID: 0.98

## 2020-12-16 ENCOUNTER — Telehealth: Payer: Self-pay | Admitting: Emergency Medicine

## 2020-12-16 DIAGNOSIS — I714 Abdominal aortic aneurysm, without rupture, unspecified: Secondary | ICD-10-CM

## 2020-12-16 DIAGNOSIS — R931 Abnormal findings on diagnostic imaging of heart and coronary circulation: Secondary | ICD-10-CM

## 2020-12-16 NOTE — Telephone Encounter (Signed)
-----   Message from Jenean Lindau, MD sent at 12/16/2020  8:53 AM EDT ----- His ejection fraction is diminished.  I would like to get an echocardiogram review this.  Copy primary care.  Get results of recent blood work from primary care.  No evidence of ischemia on the study. Jenean Lindau, MD 12/16/2020 8:53 AM

## 2020-12-16 NOTE — Telephone Encounter (Signed)
Called patient informed him of results. Advised him he will need a echocardiogram. Working on getting patient scheduled. During the call he reports that his blood pressure has been high. It goes up to 271/120 sometimes and then back down to about 024 systolic. His blood pressure this morning was 151/101 and pulse 83. He reports headaches and dizziness at times. No headaches or blurred, or dizziness right now. He is taking amlodipine, imdur, and carvedilol as documented in chart. His blood pressure is 138/85 and pulse 70. Will consult with Dr. Geraldo Pitter for further recommendations.    Per Dr. Geraldo Pitter he recommends patient take blood pressure 3 times a daily. He was advised to take blood pressure 1 hour after medications. He will bring the log to office on Monday. Patient aware if his blood pressure remains high with blurred vision, headaches, or dizziness that does not come down to go to the emergency room he understood. No further questions.

## 2020-12-21 ENCOUNTER — Ambulatory Visit (INDEPENDENT_AMBULATORY_CARE_PROVIDER_SITE_OTHER): Payer: PPO

## 2020-12-21 ENCOUNTER — Other Ambulatory Visit: Payer: Self-pay

## 2020-12-21 DIAGNOSIS — I2510 Atherosclerotic heart disease of native coronary artery without angina pectoris: Secondary | ICD-10-CM | POA: Diagnosis not present

## 2020-12-21 DIAGNOSIS — R931 Abnormal findings on diagnostic imaging of heart and coronary circulation: Secondary | ICD-10-CM | POA: Diagnosis not present

## 2020-12-21 LAB — ECHOCARDIOGRAM COMPLETE
Area-P 1/2: 3.27 cm2
Calc EF: 39.3 %
S' Lateral: 3.7 cm
Single Plane A2C EF: 32.7 %
Single Plane A4C EF: 45.5 %

## 2020-12-21 NOTE — Progress Notes (Signed)
Complete echocardiogram performed.  Jimmy Trena Dunavan RDCS, RVT  

## 2020-12-22 ENCOUNTER — Telehealth: Payer: Self-pay | Admitting: Cardiology

## 2020-12-22 NOTE — Addendum Note (Signed)
Addended by: Truddie Hidden on: 12/22/2020 02:25 PM   Modules accepted: Orders

## 2020-12-22 NOTE — Telephone Encounter (Signed)
Patient called and wanted to talk to Dr. Geraldo Pitter or the Nurse regarding his Echo results. The patient states his wife took the call but she does not understand the results. He would like someone to call him today if possible

## 2020-12-23 ENCOUNTER — Telehealth: Payer: Self-pay

## 2020-12-23 NOTE — Telephone Encounter (Signed)
Called and gave results to pt as per Dr. Julien Nordmann review. Pt will be scheduled for a chest CT at [facility] to evaluate the aorta. Pt verbalized understanding and had no additional questions.

## 2020-12-23 NOTE — Telephone Encounter (Signed)
Spoke with Murray Hodgkins ([facility]) pt has an appointment at [facility] 12/28/20 for his chest CT without contrast. Arrive at the outpatient center at 1:30 for a 2:00 appointment. Murray Hodgkins verbalized understanding and had no additional questions.

## 2020-12-26 ENCOUNTER — Encounter: Payer: Self-pay | Admitting: Cardiology

## 2020-12-28 DIAGNOSIS — I712 Thoracic aortic aneurysm, without rupture: Secondary | ICD-10-CM | POA: Diagnosis not present

## 2020-12-28 DIAGNOSIS — I714 Abdominal aortic aneurysm, without rupture: Secondary | ICD-10-CM | POA: Diagnosis not present

## 2020-12-28 DIAGNOSIS — I270 Primary pulmonary hypertension: Secondary | ICD-10-CM | POA: Diagnosis not present

## 2020-12-28 DIAGNOSIS — I2510 Atherosclerotic heart disease of native coronary artery without angina pectoris: Secondary | ICD-10-CM | POA: Diagnosis not present

## 2020-12-28 DIAGNOSIS — I10 Essential (primary) hypertension: Secondary | ICD-10-CM | POA: Diagnosis not present

## 2021-01-05 ENCOUNTER — Other Ambulatory Visit: Payer: Self-pay

## 2021-01-05 ENCOUNTER — Encounter: Payer: Self-pay | Admitting: Cardiology

## 2021-01-05 ENCOUNTER — Ambulatory Visit: Payer: PPO | Admitting: Cardiology

## 2021-01-05 VITALS — BP 220/120 | HR 78 | Ht 77.0 in | Wt 300.0 lb

## 2021-01-05 DIAGNOSIS — I7121 Aneurysm of the ascending aorta, without rupture: Secondary | ICD-10-CM

## 2021-01-05 DIAGNOSIS — E119 Type 2 diabetes mellitus without complications: Secondary | ICD-10-CM | POA: Diagnosis not present

## 2021-01-05 DIAGNOSIS — I4821 Permanent atrial fibrillation: Secondary | ICD-10-CM | POA: Diagnosis not present

## 2021-01-05 DIAGNOSIS — I10 Essential (primary) hypertension: Secondary | ICD-10-CM

## 2021-01-05 DIAGNOSIS — Z951 Presence of aortocoronary bypass graft: Secondary | ICD-10-CM

## 2021-01-05 DIAGNOSIS — I712 Thoracic aortic aneurysm, without rupture: Secondary | ICD-10-CM

## 2021-01-05 DIAGNOSIS — I2510 Atherosclerotic heart disease of native coronary artery without angina pectoris: Secondary | ICD-10-CM

## 2021-01-05 DIAGNOSIS — Z7901 Long term (current) use of anticoagulants: Secondary | ICD-10-CM | POA: Diagnosis not present

## 2021-01-05 DIAGNOSIS — E088 Diabetes mellitus due to underlying condition with unspecified complications: Secondary | ICD-10-CM

## 2021-01-05 HISTORY — DX: Aneurysm of the ascending aorta, without rupture: I71.21

## 2021-01-05 HISTORY — DX: Thoracic aortic aneurysm, without rupture: I71.2

## 2021-01-05 NOTE — Progress Notes (Signed)
Cardiology Office Note:    Date:  01/05/2021   ID:  Ryan Vaughan, DOB Jan 22, 1946, MRN [id number]  PCP:  Nicoletta Dress, MD  Cardiologist:  Jenean Lindau, MD   Referring MD: Nicoletta Dress, MD    ASSESSMENT:    1. 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 Essential hypertension   3. Permanent atrial fibrillation ([place])   4. Chronic anticoagulation   5. Diabetes mellitus due to underlying condition with unspecified complications ([place])   6. S/P CABG x 4   7. Diabetes mellitus without complication ([place])   8. Aneurysm of ascending aorta (HCC)    PLAN:    In order of problems listed above:  1. Coronary artery disease: Secondary prevention stressed with the patient.  Importance of compliance with diet medication stressed and he vocalized understanding.  I told him to walk on a daily basis at least half an hour a day and he promises to do so.  Weight reduction stressed. 2. Dilated ascending aorta: Aneurysm: Medical management at this time.  CT scan report was reviewed. 3. Essential hypertension: Blood pressure stable and diet was emphasized.  He is upset about not getting his report on time.  Therefore his blood pressure is high today but follow-up blood pressure is fine and his blood pressures at home are fine and he has checked his machine with the machine at the pharmacy.  Also we will stop isosorbide as it is giving him headaches and giving him a woozy feeling when he changes posture which may be suggestive of orthostatic hypotension. 4. Mixed dyslipidemia: Diet emphasized.  He will have blood work in the next few days by his primary care physician. 5. Diabetes mellitus and obesity: Weight reduction stressed and patient promises to do better. 6. Patient will be seen in follow-up appointment in 6 months or earlier if the patient has any concerns    Medication Adjustments/Labs and Tests Ordered: Current medicines are  reviewed at length with the patient today.  Concerns regarding medicines are outlined above.  Orders Placed This Encounter  Procedures  . EKG 12-Lead   No orders of the defined types were placed in this encounter.    No chief complaint on file.    History of Present Illness:    Ryan Vaughan is a 75 y.o. male.  Patient has past medical history of permanent atrial fibrillation, coronary artery disease, essential hypertension dyslipidemia diabetes mellitus and obesity.  He denies any problems at this time and takes care of activities of daily living.  No chest pain orthopnea or PND.  He is upset because he did not get called in about the CT scan report from the hospital in a timely fashion.  He mentions to me that his blood pressure is 947 systolic at home without any problems.  At the time of my evaluation, the patient is alert awake oriented and in no distress.  This is his blood pressure without taking Imdur.  He is having headaches with Imdur and wants to stop it.  Past Medical History:  Diagnosis Date  . Atrial fibrillation ([place])   . CAD (coronary artery disease) 11/05/2019  . Carotid artery disease ([place])   . Carotid artery stenosis 05/14/2017  . Chronic anticoagulation 04/08/2014  . Chronic atrial fibrillation ([place]) 05/04/2015  . 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 Coronary artery disease   . Diabetes mellitus due to underlying condition with unspecified complications (  [place]) 08/25/2018  . Diabetes mellitus without complication ([place])   . Dyslipidemia 05/04/2015  . Essential hypertension 04/08/2014  . Gout 01/24/2018  . H/O carotid endarterectomy 05/14/2017  . Hearing loss    bilateral - no hearing aids  . Hypertension   . Osteoarthritis of left knee 01/24/2018  . Permanent atrial fibrillation ([place]) 12/02/2020  . 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 Normajean Baxter): no flow identified in right renal artery origin or in known right renal artery aneurysm,  patent artery in the region of mid renal artery could represent a collateral vessel, stable > 60% left RA stenosis  . 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 TIA (transient ischemic attack)    due to severe RICA stenosis, s/p right CEA 04/09/14    Past Surgical History:  Procedure Laterality Date  . CAROTID ENDARTERECTOMY Right 04/09/2014  . CLIPPING OF ATRIAL APPENDAGE N/A 09/10/2019   Procedure: CLIPPING OF ATRIAL APPENDAGE Using AtriCure PRO2 Clip Size 75mm;  Surgeon: Gaye Pollack, MD;  Location: [facility];  Service: Open Heart Surgery;  Laterality: N/A;  . CORONARY ARTERY BYPASS GRAFT N/A 09/10/2019   Procedure: CORONARY ARTERY BYPASS GRAFTING (CABG) x4 , using left internal mammary artery, and right leg greater saphenous vein harvested endoscopically;  Surgeon: Gaye Pollack, MD;  Location: [facility];  Service: Open Heart Surgery;  Laterality: N/A;  . LEFT HEART CATH AND CORONARY ANGIOGRAPHY N/A 08/28/2019   Procedure: LEFT HEART CATH AND CORONARY ANGIOGRAPHY;  Surgeon: Belva Crome, MD;  Location: [facility];  Service: Cardiovascular;  Laterality: N/A;  . TEE WITHOUT CARDIOVERSION N/A 09/10/2019   Procedure: TRANSESOPHAGEAL ECHOCARDIOGRAM (TEE);  Surgeon: Gaye Pollack, MD;  Location: [facility];  Service: Open Heart Surgery;  Laterality: N/A;    Current Medications: Current Meds  Medication Sig  . acetaminophen (TYLENOL) 500 MG tablet Take 1,000-1,500 mg by mouth every 8 (eight) hours as needed for moderate pain or headache.  Marland Kitchen amLODipine (NORVASC) 5 MG tablet Take 5 mg by mouth daily.  Marland Kitchen aspirin EC 81 MG tablet Take 81 mg by mouth daily.  Marland Kitchen atorvastatin (LIPITOR) 80 MG tablet Take 80 mg by mouth daily.  . carvedilol (COREG) 12.5 MG tablet Take 12.5 mg by mouth 2 (two) times daily with a meal.  . gabapentin (NEURONTIN) 800 MG tablet Take 800 mg by mouth as needed for pain.  Marland Kitchen glimepiride (AMARYL) 4 MG tablet Take 4 mg by mouth at bedtime.   . metFORMIN (GLUCOPHAGE) 1000 MG tablet Take 1,000  mg by mouth 2 (two) times daily with a meal.  . nitroGLYCERIN (NITROSTAT) 0.4 MG SL tablet Place 1 tablet (0.4 mg total) under the tongue every 5 (five) minutes as needed for chest pain.  . rivaroxaban (XARELTO) 20 MG TABS tablet Take 1 tablet (20 mg total) by mouth daily.  . Vitamin D, Ergocalciferol, (DRISDOL) 1.25 MG (50000 UT) CAPS capsule Take 50,000 Units by mouth every Friday.      Allergies:   Penicillins, Amiodarone, Isosorbide mononitrate er [isosorbide dinitrate], and Losartan   Social History   Socioeconomic History  . Marital status: Married    Spouse name: Not on file  . Number of children: Not on file  . Years of education: Not on file  . Highest education level: Not on file  Occupational History  . Not on file  Tobacco Use  . Smoking status: Never Smoker  . Smokeless tobacco: Never Used  Vaping Use  .  Vaping Use: Never used  Substance and Sexual Activity  . Alcohol use: No  . Drug use: No  . Sexual activity: Not on file  Other Topics Concern  . Not on file  Social History Narrative  . Not on file   Social Determinants of Health   Financial Resource Strain: Not on file  Food Insecurity: Not on file  Transportation Needs: Not on file  Physical Activity: Not on file  Stress: Not on file  Social Connections: Not on file     Family History: The patient's family history includes Hypertension in his mother.  ROS:   Please see the history of present illness.    All other systems reviewed and are negative.  EKGs/Labs/Other Studies Reviewed:    The following studies were reviewed today: EKG reveals atrial fibrillation with well-controlled ventricular rate.   Recent Labs: No results found for requested labs within last 8760 hours.  Recent Lipid Panel    Component Value Date/Time   CHOL 131 11/05/2019 0847   TRIG 52 11/05/2019 0847   HDL 46 11/05/2019 0847   CHOLHDL 2.8 11/05/2019 0847   LDLCALC 73 11/05/2019 0847    Physical Exam:    VS:  BP  (!) 220/120   Pulse 78   Ht 6\' 5"  (1.956 m)   Wt 300 lb (136.1 kg)   SpO2 99%   BMI 35.57 kg/m     Wt Readings from Last 3 Encounters:  01/05/21 300 lb (136.1 kg)  12/13/20 (!) 307 lb (139.3 kg)  12/02/20 (!) 307 lb (139.3 kg)     GEN: Patient is in no acute distress HEENT: Normal NECK: No JVD; No carotid bruits LYMPHATICS: No lymphadenopathy CARDIAC: Hear sounds irregular, 2/6 systolic murmur at the apex. RESPIRATORY:  Clear to auscultation without rales, wheezing or rhonchi  ABDOMEN: Soft, non-tender, non-distended MUSCULOSKELETAL:  No edema; No deformity  SKIN: Warm and dry NEUROLOGIC:  Alert and oriented x 3 PSYCHIATRIC:  Normal affect   Signed, Jenean Lindau, MD  01/05/2021 9:30 AM    [place]

## 2021-01-05 NOTE — Patient Instructions (Addendum)
Medication Instructions:  Your physician has recommended you make the following change in your medication:   Stop Imdur  *If you need a refill on your cardiac medications before your next appointment, please call your pharmacy*   Lab Work: None ordered If you have labs (blood work) drawn today and your tests are completely normal, you will receive your results only by: Marland Kitchen MyChart Message (if you have MyChart) OR . A paper copy in the mail If you have any lab test that is abnormal or we need to change your treatment, we will call you to review the results.   Testing/Procedures: None ordered   Follow-Up: At [facility], you and your health needs are our priority.  As part of our continuing mission to provide you with exceptional heart care, we have created designated Provider Care Teams.  These Care Teams include your primary Cardiologist (physician) and Advanced Practice Providers (APPs -  Physician Assistants and Nurse Practitioners) who all work together to provide you with the care you need, when you need it.  We recommend signing up for the patient portal called "MyChart".  Sign up information is provided on this After Visit Summary.  MyChart is used to connect with patients for Virtual Visits (Telemedicine).  Patients are able to view lab/test results, encounter notes, upcoming appointments, etc.  Non-urgent messages can be sent to your provider as well.   To learn more about what you can do with MyChart, go to [email or web address].    Your next appointment:   3 month(s)  The format for your next appointment:   In Person  Provider:   Jyl Heinz, MD   Other Instructions NA

## 2021-01-16 DIAGNOSIS — Z1211 Encounter for screening for malignant neoplasm of colon: Secondary | ICD-10-CM | POA: Diagnosis not present

## 2021-01-16 DIAGNOSIS — E1165 Type 2 diabetes mellitus with hyperglycemia: Secondary | ICD-10-CM | POA: Diagnosis not present

## 2021-01-16 DIAGNOSIS — I2510 Atherosclerotic heart disease of native coronary artery without angina pectoris: Secondary | ICD-10-CM | POA: Diagnosis not present

## 2021-01-16 DIAGNOSIS — E1129 Type 2 diabetes mellitus with other diabetic kidney complication: Secondary | ICD-10-CM | POA: Diagnosis not present

## 2021-01-16 DIAGNOSIS — E785 Hyperlipidemia, unspecified: Secondary | ICD-10-CM | POA: Diagnosis not present

## 2021-01-16 DIAGNOSIS — Z79899 Other long term (current) drug therapy: Secondary | ICD-10-CM | POA: Diagnosis not present

## 2021-01-16 DIAGNOSIS — I10 Essential (primary) hypertension: Secondary | ICD-10-CM | POA: Diagnosis not present

## 2021-01-16 DIAGNOSIS — M109 Gout, unspecified: Secondary | ICD-10-CM | POA: Diagnosis not present

## 2021-01-16 DIAGNOSIS — I4891 Unspecified atrial fibrillation: Secondary | ICD-10-CM | POA: Diagnosis not present

## 2021-01-17 ENCOUNTER — Telehealth: Payer: Self-pay | Admitting: Gastroenterology

## 2021-01-17 NOTE — Telephone Encounter (Signed)
Dr. Lyndel Safe please see note below and advise. I did not see a colon report in epic.

## 2021-01-17 NOTE — Telephone Encounter (Signed)
Hi Linda, could you pls confirm when pt is due for repeat colon? It looks like his last one was in 2013 with Dr. Lyndel Safe. Thank you.

## 2021-01-25 NOTE — Telephone Encounter (Signed)
Please have him come in for an office visit before Can work him in [employer]

## 2021-01-25 NOTE — Telephone Encounter (Signed)
5-24 at 10 for patient and he said he is ok with this time and that if he feels like he needs to be sooner he will back and I told him ill work him in. He doesn't want to see a PA.

## 2021-02-21 ENCOUNTER — Ambulatory Visit (INDEPENDENT_AMBULATORY_CARE_PROVIDER_SITE_OTHER): Payer: PPO | Admitting: Gastroenterology

## 2021-02-21 ENCOUNTER — Other Ambulatory Visit: Payer: Self-pay

## 2021-02-21 ENCOUNTER — Encounter: Payer: Self-pay | Admitting: Gastroenterology

## 2021-02-21 ENCOUNTER — Telehealth: Payer: Self-pay

## 2021-02-21 VITALS — BP 148/88 | HR 74 | Ht 76.0 in | Wt 311.4 lb

## 2021-02-21 DIAGNOSIS — Z7901 Long term (current) use of anticoagulants: Secondary | ICD-10-CM | POA: Diagnosis not present

## 2021-02-21 DIAGNOSIS — Z86010 Personal history of colonic polyps: Secondary | ICD-10-CM

## 2021-02-21 DIAGNOSIS — Z8679 Personal history of other diseases of the circulatory system: Secondary | ICD-10-CM | POA: Diagnosis not present

## 2021-02-21 MED ORDER — NA SULFATE-K SULFATE-MG SULF 17.5-3.13-1.6 GM/177ML PO SOLN: 1.0000 | Freq: Once | ORAL | 0 refills | Status: AC

## 2021-02-21 NOTE — Telephone Encounter (Signed)
Patient with diagnosis of afib on Xarelto for anticoagulation.    Procedure: colonoscopy Date of procedure: 05/02/21  CHA2DS2-VASc Score = 6  This indicates a 9.7% annual risk of stroke. The patient's score is based upon: CHF History: No HTN History: Yes Diabetes History: Yes Stroke History: Yes Vascular Disease History: Yes Age Score: 1 Gender Score: 0   CrCl > 143mL/min using adjusted body weight Platelet count 139K  Would prefer 1 day Xarelto hold prior to procedure given pt's elevated CV risk including prior TIA. If 2 day hold is required, will need MD approval.

## 2021-02-21 NOTE — Telephone Encounter (Signed)
[facility] Pre-operative Risk Assessment     Request for surgical clearance:     Endoscopy Procedure  What type of surgery is being performed?     Colon  When is this surgery scheduled?     05/02/2021  What type of clearance is required ?   Pharmacy  Are there any medications that need to be held prior to surgery and how long? Xarelto 2 day hold  Practice name and name of physician performing surgery?      [facility] Dr Lyndel Safe  What is your office phone and fax number?      Phone- [phone]  [phone]  Anesthesia type (None, local, MAC, general) ?       MAC

## 2021-02-21 NOTE — Patient Instructions (Addendum)
If you are age 74 or older, your body mass index should be between 23-30. Your Body mass index is 37.9 kg/m. If this is out of the aforementioned range listed, please consider follow up with your Primary Care Provider.  If you are age 7 or younger, your body mass index should be between 19-25. Your Body mass index is 37.9 kg/m. If this is out of the aformentioned range listed, please consider follow up with your Primary Care Provider.   You have been scheduled for a colonoscopy. Please follow written instructions given to you at your visit today.  Please pick up your prep supplies at the pharmacy within the next 1-3 days. If you use inhalers (even only as needed), please bring them with you on the day of your procedure.  Two days before your procedure: Mix 3 packs (or capfuls) of Miralax in 48 ounces of clear liquid and drink at 6pm.  You will be contacted by our office prior to your procedure for directions on holding your xarelto.  If you do not hear from our office 1 week prior to your scheduled procedure, please call [phone] to discuss.    Please call with any questions or concerns.  Thank you,  Dr. Jackquline Denmark

## 2021-02-21 NOTE — Progress Notes (Signed)
Chief Complaint: For colonoscopy  Referring Provider:  Nicoletta Dress, MD      ASSESSMENT AND PLAN;   #1. H/O polyps  #2. H/O A Fib on Xarelto (EF 55-60% 11/2020), DM2, CAD s/p CABG, HLD, HTN, gout, PAD  Plan: - Proceed with colonoscopy with 2 day prep after cardio clearence Dr Geraldo Pitter. Hold Xeralto 2 days before.  Discussed risks & benefits. Risks including rare perforation req laparotomy, bleeding after bx/polypectomy req blood transfusion, rarely missing neoplasms, risks of anesthesia/sedation, rare risk of damage to internal organs. Benefits outweigh the risks. Patient agrees to proceed. All the questions were answered. Pt consents to proceed.      HPI:    Ryan Vaughan is a 75 y.o. male  With H/O A Fib on Xarelto (EF 55-60% 11/2020), DM, CAD s/p CABG, HLD, HTN, gout, PAD With H/O colonic polyps in the past.  No nausea, vomiting, heartburn, regurgitation, odynophagia or dysphagia.  No significant diarrhea or constipation. No melena or hematochezia. No unintentional weight loss. No abdominal pain.  Past GI procedures: Colonoscopy 08/2012 ([facility]): 1 cm flat sessile polyp in proximal ascending colon s/p polypectomy, 2.5 cm rectal polyp s/p EMR, tattooing, Mod sigmoid diverticulosis.  Subsequent flexible sigmoidoscopy 11/2012 showed minimal residual polyp s/p polypectomy.  Bx- TA. He was advised to repeat in 3 yrs. He did get the letter Past Medical History:  Diagnosis Date  . Atrial fibrillation ([place])   . CAD (coronary artery disease) 11/05/2019  . Carotid artery disease ([place])   . Carotid artery stenosis 05/14/2017  . Chronic anticoagulation 04/08/2014  . Chronic atrial fibrillation ([place]) 05/04/2015  . 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 Coronary artery disease   . Diabetes mellitus due to underlying condition with unspecified complications ([place]) 29/93/7169  . Diabetes mellitus without complication ([place])   . Dyslipidemia 05/04/2015  . Essential hypertension  04/08/2014  . Gout 01/24/2018  . H/O carotid endarterectomy 05/14/2017  . Hearing loss    bilateral - no hearing aids  . Hypertension   . Osteoarthritis of left knee 01/24/2018  . Permanent atrial fibrillation ([place]) 12/02/2020  . 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 Normajean Baxter): no flow identified in right renal artery origin or in known right renal artery aneurysm, patent artery in the region of mid renal artery could represent a collateral vessel, stable > 60% left RA stenosis  . 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 TIA (transient ischemic attack)    due to severe RICA stenosis, s/p right CEA 04/09/14    Past Surgical History:  Procedure Laterality Date  . CAROTID ENDARTERECTOMY Right 04/09/2014  . CLIPPING OF ATRIAL APPENDAGE N/A 09/10/2019   Procedure: CLIPPING OF ATRIAL APPENDAGE Using AtriCure PRO2 Clip Size 73mm;  Surgeon: Gaye Pollack, MD;  Location: [facility];  Service: Open Heart Surgery;  Laterality: N/A;  . COLONOSCOPY  08/06/2012   Large rectal polpy status post piecemeal polypectomy. Ascending colon polyp status post polypectomy.  . CORONARY ARTERY BYPASS GRAFT N/A 09/10/2019   Procedure: CORONARY ARTERY BYPASS GRAFTING (CABG) x4 , using left internal mammary artery, and right leg greater saphenous vein harvested endoscopically;  Surgeon: Gaye Pollack, MD;  Location: [facility];  Service: Open Heart Surgery;  Laterality: N/A;  . LEFT HEART CATH AND CORONARY ANGIOGRAPHY N/A 08/28/2019   Procedure: LEFT HEART CATH AND CORONARY ANGIOGRAPHY;  Surgeon: Belva Crome, MD;  Location: [facility];  Service: Cardiovascular;  Laterality:  N/A;  . SIGMOIDOSCOPY  11/17/2012   Minimal residual polyp, status post polypectomy. Small internal hemorrhoids.  . TEE WITHOUT CARDIOVERSION N/A 09/10/2019   Procedure: TRANSESOPHAGEAL ECHOCARDIOGRAM (TEE);  Surgeon: Gaye Pollack, MD;  Location: [facility];  Service: Open Heart Surgery;  Laterality: N/A;    Family History  Problem  Relation Age of Onset  . Hypertension Mother   . Colon cancer Neg Hx   . Stomach cancer Neg Hx   . Liver cancer Neg Hx   . Throat cancer Neg Hx     Social History   Tobacco Use  . Smoking status: Never Smoker  . Smokeless tobacco: Never Used  Vaping Use  . Vaping Use: Never used  Substance Use Topics  . Alcohol use: No  . Drug use: No    Current Outpatient Medications  Medication Sig Dispense Refill  . acetaminophen (TYLENOL) 500 MG tablet Take 1,000-1,500 mg by mouth every 8 (eight) hours as needed for moderate pain or headache.    Marland Kitchen amLODipine (NORVASC) 5 MG tablet Take 5 mg by mouth daily.    Marland Kitchen aspirin EC 81 MG tablet Take 81 mg by mouth daily.    Marland Kitchen atorvastatin (LIPITOR) 80 MG tablet Take 80 mg by mouth daily.    . carvedilol (COREG) 12.5 MG tablet Take 12.5 mg by mouth 2 (two) times daily with a meal.    . gabapentin (NEURONTIN) 800 MG tablet Take 800 mg by mouth as needed for pain.    Marland Kitchen glimepiride (AMARYL) 4 MG tablet Take 4 mg by mouth at bedtime.     . metFORMIN (GLUCOPHAGE) 1000 MG tablet Take 1,000 mg by mouth 2 (two) times daily with a meal.    . nitroGLYCERIN (NITROSTAT) 0.4 MG SL tablet Place 1 tablet (0.4 mg total) under the tongue every 5 (five) minutes as needed for chest pain. 30 tablet 4  . rivaroxaban (XARELTO) 20 MG TABS tablet Take 1 tablet (20 mg total) by mouth daily. 180 tablet 3  . Vitamin D, Ergocalciferol, (DRISDOL) 1.25 MG (50000 UT) CAPS capsule Take 50,000 Units by mouth every Friday.      No current facility-administered medications for this visit.    Allergies  Allergen Reactions  . Penicillins Rash    Did it involve swelling of the face/tongue/throat, SOB, or low BP? No Did it involve sudden or severe rash/hives, skin peeling, or any reaction on the inside of your mouth or nose? No Did you need to seek medical attention at a hospital or doctor's office? No When did it last happen?2-3 years If all above answers are "NO", may proceed  with cephalosporin use.   . Amiodarone Hives  . Isosorbide Mononitrate Er [Isosorbide Dinitrate] Other (See Comments)    Headache and dizziness  . Losartan Swelling    Review of Systems:  Constitutional: Denies fever, chills, diaphoresis, appetite change and fatigue.  HEENT: Denies photophobia, eye pain, redness, hearing loss, ear pain, congestion, sore throat, rhinorrhea, sneezing, mouth sores, neck pain, neck stiffness and tinnitus.   Respiratory: Denies SOB, DOE, cough, chest tightness,  and wheezing.   Cardiovascular: Denies chest pain, palpitations and leg swelling.  Genitourinary: Denies dysuria, urgency, frequency, hematuria, flank pain and difficulty urinating.  Musculoskeletal: Denies myalgias, back pain, joint swelling, arthralgias and gait problem.  Skin: No rash.  Neurological: Denies dizziness, seizures, syncope, weakness, light-headedness, numbness and headaches.  Hematological: Denies adenopathy. Easy bruising, personal or family bleeding history  Psychiatric/Behavioral: No anxiety or depression  Physical Exam:    BP (!) 148/88   Pulse 74   Ht 6\' 4"  (1.93 m)   Wt (!) 311 lb 6 oz (141.2 kg)   SpO2 97%   BMI 37.90 kg/m  Wt Readings from Last 3 Encounters:  02/21/21 (!) 311 lb 6 oz (141.2 kg)  01/05/21 300 lb (136.1 kg)  12/13/20 (!) 307 lb (139.3 kg)   Constitutional:  Well-developed, in no acute distress. Psychiatric: Normal mood and affect. Behavior is normal. HEENT: Pupils normal.  Conjunctivae are normal. No scleral icterus. Neck supple.  Cardiovascular: Normal rate, regular rhythm. No edema Pulmonary/chest: Effort normal and breath sounds normal. No wheezing, rales or rhonchi. Abdominal: Soft, nondistended. Nontender. Bowel sounds active throughout. There are no masses palpable. No hepatomegaly. Rectal: Deferred Neurological: Alert and oriented to person place and time. Skin: Skin is warm and dry. No rashes noted.  Data Reviewed: I have personally  reviewed following labs and imaging studies  CBC: CBC Latest Ref Rng & Units 11/05/2019 10/05/2019 09/13/2019  WBC 3.4 - 10.8 x10E3/uL 5.8 8.9 12.4(H)  Hemoglobin 13.0 - 17.7 g/dL 12.8(L) 11.3(L) 10.2(L)  Hematocrit 37.5 - 51.0 % 38.0 33.2(L) 30.0(L)  Platelets 150 - 450 x10E3/uL 169 243 105(L)    CMP: CMP Latest Ref Rng & Units 11/05/2019 10/05/2019 09/13/2019  Glucose 65 - 99 mg/dL 106(H) 177(H) 145(H)  BUN 8 - 27 mg/dL 12 17 13   Creatinine 0.76 - 1.27 mg/dL 0.90 0.79 0.90  Sodium 134 - 144 mmol/L 140 139 136  Potassium 3.5 - 5.2 mmol/L 4.7 5.0 4.4  Chloride 96 - 106 mmol/L 107(H) 103 101  CO2 20 - 29 mmol/L 20 24 27   Calcium 8.6 - 10.2 mg/dL 8.8 9.4 8.1(L)  Total Protein 6.0 - 8.5 g/dL 7.0 7.0 -  Total Bilirubin 0.0 - 1.2 mg/dL 0.5 0.3 -  Alkaline Phos 39 - 117 IU/L 66 72 -  AST 0 - 40 IU/L 37 9 -  ALT 0 - 44 IU/L 61(H) 7 -       Carmell Austria, MD 02/21/2021, 10:18 AM  Cc: Nicoletta Dress, MD

## 2021-02-21 NOTE — Telephone Encounter (Signed)
Will route to PharmD for rec's re: holding anticoagulation. Richardson Dopp, PA-C    02/21/2021 12:29 PM

## 2021-02-21 NOTE — Telephone Encounter (Signed)
GI office closed. Will have to call tomorrow.

## 2021-02-21 NOTE — Telephone Encounter (Signed)
Please contact GI office. We prefer the pt only hold Xarelto for 1 day. Please ask if GI is ok with holding for 1 day only.  If not, let me know. Richardson Dopp, PA-C    02/21/2021 5:07 PM

## 2021-02-22 NOTE — Telephone Encounter (Signed)
Placed a call to [facility], spoke with Olivia Mackie, CMA, she stated that the 1 day hold of Xarelto would be fine, that it was up to the Cardiologist, that they just use 2 day as a standard for their form. So it is ok for pt to hold Xarelto 1 day prior to procedure.

## 2021-02-28 NOTE — Telephone Encounter (Signed)
Patient made aware.

## 2021-03-25 ENCOUNTER — Other Ambulatory Visit: Payer: Self-pay | Admitting: Cardiology

## 2021-04-18 DIAGNOSIS — I4891 Unspecified atrial fibrillation: Secondary | ICD-10-CM | POA: Diagnosis not present

## 2021-04-18 DIAGNOSIS — Z79899 Other long term (current) drug therapy: Secondary | ICD-10-CM | POA: Diagnosis not present

## 2021-04-18 DIAGNOSIS — I10 Essential (primary) hypertension: Secondary | ICD-10-CM | POA: Diagnosis not present

## 2021-04-18 DIAGNOSIS — E1165 Type 2 diabetes mellitus with hyperglycemia: Secondary | ICD-10-CM | POA: Diagnosis not present

## 2021-04-18 DIAGNOSIS — Z125 Encounter for screening for malignant neoplasm of prostate: Secondary | ICD-10-CM | POA: Diagnosis not present

## 2021-04-18 DIAGNOSIS — E785 Hyperlipidemia, unspecified: Secondary | ICD-10-CM | POA: Diagnosis not present

## 2021-04-18 DIAGNOSIS — E1129 Type 2 diabetes mellitus with other diabetic kidney complication: Secondary | ICD-10-CM | POA: Diagnosis not present

## 2021-04-18 DIAGNOSIS — M109 Gout, unspecified: Secondary | ICD-10-CM | POA: Diagnosis not present

## 2021-04-18 DIAGNOSIS — I2510 Atherosclerotic heart disease of native coronary artery without angina pectoris: Secondary | ICD-10-CM | POA: Diagnosis not present

## 2021-04-19 ENCOUNTER — Other Ambulatory Visit: Payer: Self-pay

## 2021-04-20 ENCOUNTER — Other Ambulatory Visit: Payer: Self-pay

## 2021-04-20 ENCOUNTER — Ambulatory Visit: Payer: PPO | Admitting: Cardiology

## 2021-04-20 ENCOUNTER — Encounter: Payer: Self-pay | Admitting: Cardiology

## 2021-04-20 VITALS — BP 172/96 | HR 82 | Ht 76.6 in | Wt 315.8 lb

## 2021-04-20 DIAGNOSIS — I2510 Atherosclerotic heart disease of native coronary artery without angina pectoris: Secondary | ICD-10-CM

## 2021-04-20 DIAGNOSIS — Z7901 Long term (current) use of anticoagulants: Secondary | ICD-10-CM

## 2021-04-20 DIAGNOSIS — I4821 Permanent atrial fibrillation: Secondary | ICD-10-CM

## 2021-04-20 DIAGNOSIS — E785 Hyperlipidemia, unspecified: Secondary | ICD-10-CM

## 2021-04-20 DIAGNOSIS — I10 Essential (primary) hypertension: Secondary | ICD-10-CM | POA: Diagnosis not present

## 2021-04-20 DIAGNOSIS — Z951 Presence of aortocoronary bypass graft: Secondary | ICD-10-CM

## 2021-04-20 DIAGNOSIS — E119 Type 2 diabetes mellitus without complications: Secondary | ICD-10-CM | POA: Diagnosis not present

## 2021-04-20 DIAGNOSIS — Z9889 Other specified postprocedural states: Secondary | ICD-10-CM

## 2021-04-20 DIAGNOSIS — I712 Thoracic aortic aneurysm, without rupture: Secondary | ICD-10-CM

## 2021-04-20 DIAGNOSIS — I7121 Aneurysm of the ascending aorta, without rupture: Secondary | ICD-10-CM

## 2021-04-20 DIAGNOSIS — I722 Aneurysm of renal artery: Secondary | ICD-10-CM

## 2021-04-20 NOTE — Patient Instructions (Signed)

## 2021-04-20 NOTE — Progress Notes (Signed)
Cardiology Office Note:    Date:  04/20/2021   ID:  Ryan Vaughan, DOB [birth year], MRN [id number]  PCP:  Nicoletta Dress, MD  Cardiologist:  Jenean Lindau, MD   Referring MD: Nicoletta Dress, MD    ASSESSMENT:    No diagnosis found. PLAN:    In order of problems listed above:  Coronary artery disease: Secondary prevention stressed with the patient.  Importance of compliance with diet medication stressed any vocalized understanding.  He was advised to walk at least half an hour a day 5 days a week and he promises to do so. Ascending aortic aneurysm: Stable at this time.  CT scan report was revisited and we will monitor this every year. Permanent atrial fibrillation:I discussed with the patient atrial fibrillation, disease process. Management and therapy including rate and rhythm control, anticoagulation benefits and potential risks were discussed extensively with the patient. Patient had multiple questions which were answered to patient's satisfaction. Essential hypertension: Blood pressure stable and diet was emphasized.  He has an element of whitecoat hypertension also.  He checks his blood pressures at home and his numbers are fine. Mixed dyslipidemia: Diabetes mellitus and obesity: Lifestyle modification was urged to exercise emphasized and he promises to do better. Patient will be seen in follow-up appointment in 6 months or earlier if the patient has any concerns    Medication Adjustments/Labs and Tests Ordered: Current medicines are reviewed at length with the patient today.  Concerns regarding medicines are outlined above.  No orders of the defined types were placed in this encounter.  No orders of the defined types were placed in this encounter.    No chief complaint on file.    History of Present Illness:    Ryan Vaughan is a 75 y.o. male.  Patient has past medical history of coronary artery disease, essential hypertension, dyslipidemia, diabetes  mellitus, morbid obesity and permanent atrial fibrillation.  He denies any problems at this time and takes care of activities of daily living.  No chest pain orthopnea or PND.  At the time of my evaluation, the patient is alert awake oriented and in no distress.  He tells me that his hemoglobin A1c is greater than 8 and he has gained weight.  He has been lax with diet and exercise.  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 Ascending colon polyp status post polypectomy.   CORONARY ARTERY BYPASS GRAFT N/A 09/10/2019   Procedure: CORONARY ARTERY BYPASS GRAFTING (CABG) x4 , using left internal mammary artery, and right leg greater saphenous vein harvested endoscopically;  Surgeon: BGaye Pollack MD;  [facility];  Service: Open Heart Surgery;  Laterality: N/A;   LEFT HEART CATH AND CORONARY ANGIOGRAPHY N/A 08/28/2019   Procedure: LEFT HEART CATH AND CORONARY ANGIOGRAPHY;  Surgeon: SBelva Crome MD;  [facility];  Service: Cardiovascular;  Laterality: N/A;   SIGMOIDOSCOPY  11/17/2012   Minimal residual polyp, status post polypectomy. Small internal hemorrhoids.   TEE WITHOUT CARDIOVERSION N/A 09/10/2019   Procedure: TRANSESOPHAGEAL ECHOCARDIOGRAM (TEE);  Surgeon: BGaye Pollack MD;  [facility]  Service: Open Heart Surgery;  Laterality: N/A;    Current Medications: Current Meds  Medication Sig   acetaminophen (TYLENOL) 500 MG tablet Take 1,000-1,500 mg by mouth every 8 (eight) hours as needed for moderate pain or headache.   amLODipine (NORVASC) 5 MG tablet Take 5 mg by mouth daily.   aspirin EC 81 MG tablet Take 81 mg by mouth daily.   atorvastatin (LIPITOR) 80 MG tablet Take 80 mg by mouth daily.   carvedilol (COREG) 12.5 MG tablet Take 12.5 mg by mouth 2 (two) times daily with a meal.   gabapentin (NEURONTIN) 800 MG tablet Take 800 mg by mouth as needed for pain.   glimepiride (AMARYL) 4 MG tablet Take 4 mg by mouth at bedtime.    metFORMIN (GLUCOPHAGE) 1000 MG tablet Take 1,000 mg by mouth 2 (two) times daily with a meal.   nitroGLYCERIN (NITROSTAT) 0.4 MG SL tablet Place 1 tablet (0.4 mg total) under the tongue every 5 (five) minutes as needed for chest pain.   rivaroxaban (XARELTO) 20 MG TABS tablet Take 1 tablet (20 mg total) by mouth daily.   Vitamin D, Ergocalciferol, (DRISDOL) 1.25 MG (50000 UT) CAPS capsule Take 50,000  Units by mouth every Friday.      Allergies:   Penicillins, Amiodarone, Isosorbide mononitrate er [isosorbide dinitrate], and Losartan   Social History   Socioeconomic History   Marital status: Married    Spouse name: Not on file   Number of children: 2   Years of education: Not on file   Highest education level: Not on file  Occupational History   Occupation: Retired  Tobacco Use   Smoking status: Never   Smokeless tobacco: Never  Vaping Use   Vaping Use: Never used  Substance and Sexual Activity   Alcohol use: No   Drug use: No   Sexual activity: Not on file  Other Topics Concern   Not on file  Social History Narrative   Not on file   Social Determinants of Health   Financial Resource Strain: Not on file  Food Insecurity: Not on file  Transportation Needs: Not on file  Physical Activity: Not on file  Stress: Not on file  Social Connections: Not on file     Family History:  The patient's family history includes Hypertension in his mother. There is no history of Colon cancer, Stomach cancer, Liver cancer, or Throat cancer.  ROS:   Please see the history of present illness.    All other systems reviewed and are negative.  EKGs/Labs/Other Studies Reviewed:    The following studies were reviewed today: I discussed my findings with the patient extensively.   Recent Labs: No results found for requested labs within last 8760 hours.  Recent Lipid Panel    Component Value Date/Time   CHOL 131 11/05/2019 0847   TRIG 52 11/05/2019 0847   HDL 46 11/05/2019 0847   CHOLHDL 2.8 11/05/2019 0847   LDLCALC 73 11/05/2019 0847    Physical Exam:    VS:  BP (!) 172/96   Pulse 82   Ht 6' 4.6" (1.946 m)   Wt (!) 315 lb 12.8 oz (143.2 kg)   SpO2 95%   BMI 37.84 kg/m     Wt Readings from Last 3 Encounters:  04/20/21 (!) 315 lb 12.8 oz (143.2 kg)  02/21/21 (!) 311 lb 6 oz (141.2 kg)  01/05/21 300 lb (136.1 kg)     GEN: Patient is in no acute distress HEENT:  Normal NECK: No JVD; No carotid bruits LYMPHATICS: No lymphadenopathy CARDIAC: Hear sounds regular, 2/6 systolic murmur at the apex. RESPIRATORY:  Clear to auscultation without rales, wheezing or rhonchi  ABDOMEN: Soft, non-tender, non-distended MUSCULOSKELETAL:  No edema; No deformity  SKIN: Warm and dry NEUROLOGIC:  Alert and oriented x 3 PSYCHIATRIC:  Normal affect   Signed, Jenean Lindau, MD  04/20/2021 8:30 AM    [place]

## 2021-04-25 ENCOUNTER — Telehealth: Payer: Self-pay | Admitting: Gastroenterology

## 2021-04-26 NOTE — Telephone Encounter (Signed)
Patient will pick up miralax instructions from the [place] office Thursday 04/27/2021 in the morning. They are placed at the front

## 2021-04-26 NOTE — Telephone Encounter (Signed)
Prep was sent in on May 24 and patient said he didn't go to pick it up from [facility] so I told him that he needs to find out if it is still there and if not then he needs to call us back.

## 2021-04-26 NOTE — Telephone Encounter (Signed)
Pt states that prep for procedure is over $100. He wants to know if there is something cheaper. He stated that he can get Miralax for $12. Pls call him. His proc is on 8/2.

## 2021-05-02 ENCOUNTER — Encounter: Payer: Self-pay | Admitting: Gastroenterology

## 2021-05-02 ENCOUNTER — Ambulatory Visit (AMBULATORY_SURGERY_CENTER): Payer: PPO | Admitting: Gastroenterology

## 2021-05-02 ENCOUNTER — Other Ambulatory Visit: Payer: Self-pay

## 2021-05-02 VITALS — BP 150/105 | HR 57 | Temp 97.8°F | Resp 12 | Ht 76.0 in | Wt 311.0 lb

## 2021-05-02 DIAGNOSIS — D125 Benign neoplasm of sigmoid colon: Secondary | ICD-10-CM

## 2021-05-02 DIAGNOSIS — Z86010 Personal history of colonic polyps: Secondary | ICD-10-CM | POA: Diagnosis not present

## 2021-05-02 DIAGNOSIS — K635 Polyp of colon: Secondary | ICD-10-CM | POA: Diagnosis not present

## 2021-05-02 DIAGNOSIS — K648 Other hemorrhoids: Secondary | ICD-10-CM | POA: Diagnosis not present

## 2021-05-02 DIAGNOSIS — K5730 Diverticulosis of large intestine without perforation or abscess without bleeding: Secondary | ICD-10-CM | POA: Diagnosis not present

## 2021-05-02 MED ORDER — SODIUM CHLORIDE 0.9 % IV SOLN: 500.0000 mL | Freq: Once | INTRAVENOUS | Status: DC

## 2021-05-02 NOTE — Progress Notes (Signed)
Medical history reviewed with no changes noted. VS assessed by C.W 

## 2021-05-02 NOTE — Op Note (Signed)
[facility] Patient Name: Ryan Vaughan Procedure Date: 05/02/2021 11:02 AM MRN: [id number] Endoscopist: Jackquline Denmark , MD Age: 75 Referring MD:  Date of Birth: [birth year] Gender: Male Account #: [id number] Procedure:                Colonoscopy Indications:              High risk colon cancer surveillance: Personal                            history of colonic polyps Medicines:                Monitored Anesthesia Care Procedure:                Pre-Anesthesia Assessment:                           - Prior to the procedure, a History and Physical                            was performed, and patient medications and                            allergies were reviewed. The patient's tolerance of                            previous anesthesia was also reviewed. The risks                            and benefits of the procedure and the sedation                            options and risks were discussed with the patient.                            All questions were answered, and informed consent                            was obtained. Prior Anticoagulants: The patient has                            taken Xarelto-last dose 2 days before. ASA Grade                            Assessment: III - A patient with severe systemic                            disease. After reviewing the risks and benefits,                            the patient was deemed in satisfactory condition to                            undergo the procedure.  After obtaining informed consent, the colonoscope                            was passed under direct vision. Throughout the                            procedure, the patient's blood pressure, pulse, and                            oxygen saturations were monitored continuously. The                            CF [id number] was introduced through the anus                            and advanced to the the cecum, identified by                             appendiceal orifice and ileocecal valve. The                            colonoscopy was performed without difficulty. The                            patient tolerated the procedure well. The quality                            of the bowel preparation was good. The ileocecal                            valve, appendiceal orifice, and rectum were                            photographed. Scope In: 11:16:48 AM Scope Out: 11:35:19 AM Scope Withdrawal Time: 0 hours 13 minutes 12 seconds  Total Procedure Duration: 0 hours 18 minutes 31 seconds  Findings:                 Two sessile polyps were found in the mid sigmoid                            colon and distal sigmoid colon. The polyps were 4                            to 6 mm in size. These polyps were removed with a                            cold snare. Resection and retrieval were complete.                           Multiple medium-mouthed diverticula were found in                            the sigmoid colon, descending colon  and ascending                            colon.                           Non-bleeding internal hemorrhoids were found during                            retroflexion. The hemorrhoids were moderate.                           A tattoo was noted in the rectum. There were no                            residual or recurrent polyps in that area.                           The exam was otherwise without abnormality on                            direct and retroflexion views. Complications:            No immediate complications. Estimated Blood Loss:     Estimated blood loss: none. Impression:               - Two 4 to 6 mm polyps in the mid sigmoid colon and                            in the distal sigmoid colon, removed with a cold                            snare. Resected and retrieved.                           - Moderate predominantly sigmoid diverticulosis.                           - Non-bleeding  internal hemorrhoids.                           - The examination was otherwise normal on direct                            and retroflexion views. Recommendation:           - Patient has a contact number available for                            emergencies. The signs and symptoms of potential                            delayed complications were discussed with the                            patient. Return to normal activities tomorrow.  Written discharge instructions were provided to the                            patient.                           - Resume previous diet.                           - Continue present medications.                           - Await pathology results.                           - Repeat colonoscopy for surveillance based on                            pathology results.                           - Resume Xarelto from tomorrow onwards.                           - Return to GI clinic PRN. Jackquline Denmark, MD 05/02/2021 11:44:58 AM This report has been signed electronically.

## 2021-05-02 NOTE — Progress Notes (Signed)
Called to room to assist during endoscopic procedure.  Patient ID and intended procedure confirmed with present staff. Received instructions for my participation in the procedure from the performing physician.  

## 2021-05-02 NOTE — Progress Notes (Signed)
pt tolerated well. VSS. awake and to recovery. Report given to RN.  

## 2021-05-02 NOTE — Patient Instructions (Signed)
YOU HAD AN ENDOSCOPIC PROCEDURE TODAY AT [facility]:   Refer to the procedure report that was given to you for any specific questions about what was found during the examination.  If the procedure report does not answer your questions, please call your gastroenterologist to clarify.  If you requested that your care partner not be given the details of your procedure findings, then the procedure report has been included in a sealed envelope for you to review at your convenience later.  YOU SHOULD EXPECT: Some feelings of bloating in the abdomen. Passage of more gas than usual.  Walking can help get rid of the air that was put into your GI tract during the procedure and reduce the bloating. If you had a lower endoscopy (such as a colonoscopy or flexible sigmoidoscopy) you may notice spotting of blood in your stool or on the toilet paper. If you underwent a bowel prep for your procedure, you may not have a normal bowel movement for a few days.  Please Note:  You might notice some irritation and congestion in your nose or some drainage.  This is from the oxygen used during your procedure.  There is no need for concern and it should clear up in a day or so.  **Handouts given on polyps, hemorrhoids and diverticulosis**  SYMPTOMS TO REPORT IMMEDIATELY:  Following lower endoscopy (colonoscopy or flexible sigmoidoscopy):  Excessive amounts of blood in the stool  Significant tenderness or worsening of abdominal pains  Swelling of the abdomen that is new, acute  Fever of 100F or higher   For urgent or emergent issues, a gastroenterologist can be reached at any hour by calling [phone]. Do not use MyChart messaging for urgent concerns.    DIET:  We do recommend a small meal at first, but then you may proceed to your regular diet.  Drink plenty of fluids but you should avoid alcoholic beverages for 24 hours.  ACTIVITY:  You should plan to take it easy for the rest of today and you  should NOT DRIVE or use heavy machinery until tomorrow (because of the sedation medicines used during the test).    FOLLOW UP: Our staff will call the number listed on your records 48-72 hours following your procedure to check on you and address any questions or concerns that you may have regarding the information given to you following your procedure. If we do not reach you, we will leave a message.  We will attempt to reach you two times.  During this call, we will ask if you have developed any symptoms of COVID 19. If you develop any symptoms (ie: fever, flu-like symptoms, shortness of breath, cough etc.) before then, please call [phone].  If you test positive for Covid 19 in the 2 weeks post procedure, please call and report this information to [place].    If any biopsies were taken you will be contacted by phone or by letter within the next 1-3 weeks.  Please call us at [phone] if you have not heard about the biopsies in 3 weeks.    SIGNATURES/CONFIDENTIALITY: You and/or your care partner have signed paperwork which will be entered into your electronic medical record.  These signatures attest to the fact that that the information above on your After Visit Summary has been reviewed and is understood.  Full responsibility of the confidentiality of this discharge information lies with you and/or your care-partner.

## 2021-05-04 ENCOUNTER — Telehealth: Payer: Self-pay

## 2021-05-04 NOTE — Telephone Encounter (Signed)
  Follow up Call-  Call back number 05/02/2021  Post procedure Call Back phone  # [phone]  Permission to leave phone message Yes  Some recent data might be hidden     Patient questions:  Do you have a fever, pain , or abdominal swelling? No. Pain Score  0 *  Have you tolerated food without any problems? Yes.    Have you been able to return to your normal activities? Yes.    Do you have any questions about your discharge instructions: Diet   No. Medications  No. Follow up visit  No.  Do you have questions or concerns about your Care? No.  Actions: * If pain score is 4 or above: No action needed, pain <4.

## 2021-05-09 ENCOUNTER — Encounter: Payer: Self-pay | Admitting: Gastroenterology

## 2021-05-24 DIAGNOSIS — H40053 Ocular hypertension, bilateral: Secondary | ICD-10-CM | POA: Diagnosis not present

## 2021-05-24 DIAGNOSIS — H524 Presbyopia: Secondary | ICD-10-CM | POA: Diagnosis not present

## 2021-06-18 IMAGING — DX DG CHEST 1V PORT
1 series · 1 of 1 positions shown · non-contrast
Comparison: Chest radiograph 09/11/2019

CLINICAL DATA: Status post CABG.

EXAM:
PORTABLE CHEST 1 VIEW

[chest]
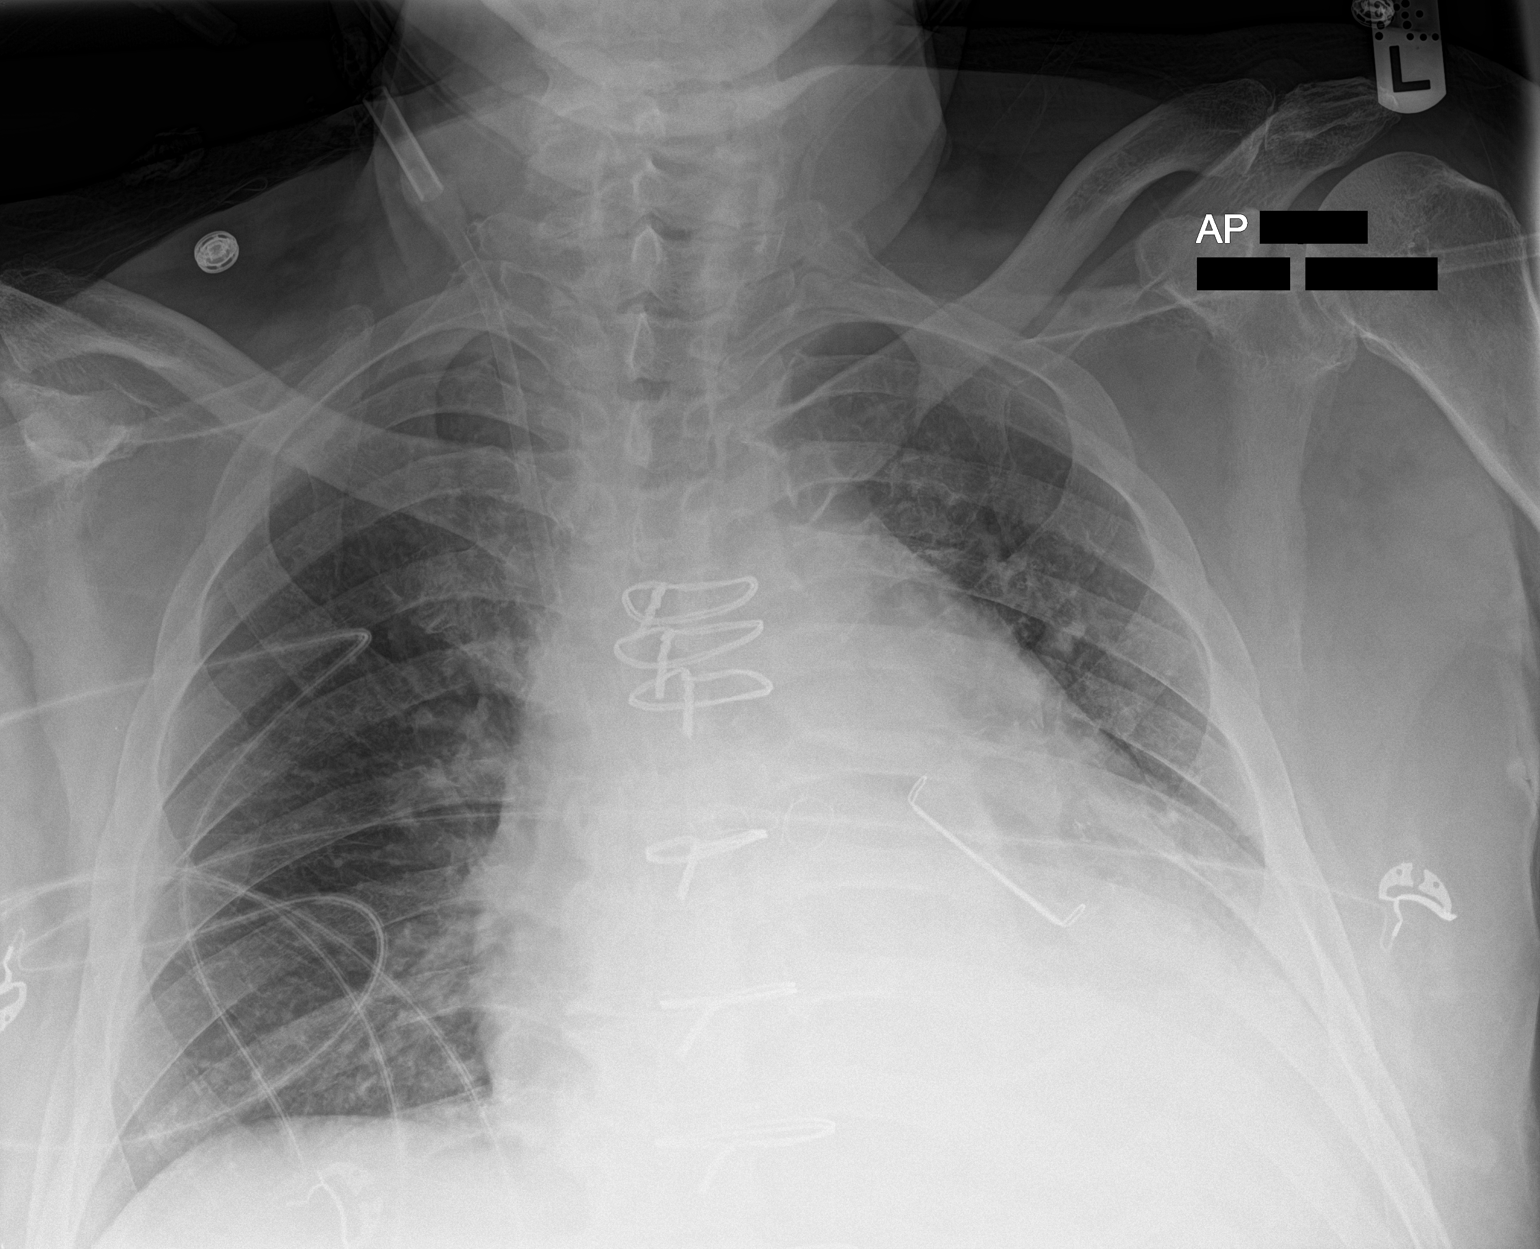

[1 of 1 positions shown; findings below may reference images not displayed]

FINDINGS: Stable cardiomediastinal contours with cardiomegaly status post
median sternotomy. Right IJ sheath remains in place. Interval
removal of left chest tube and mediastinal drain. No large
pneumothorax or pleural effusion. Persistent left retrocardiac
opacity. Right lung is clear. Central vascular congestion.
Visualized portions of the skeleton are unremarkable.
IMPRESSION: Persistent left retrocardiac opacity, likely atelectasis. No other
acute findings.

## 2021-06-19 IMAGING — DX DG CHEST 2V
2 series · 2 of 2 positions shown · non-contrast
Comparison: September 12, 2019.

CLINICAL DATA: Postoperative status.

EXAM:
CHEST - 2 VIEW

[chest pa]
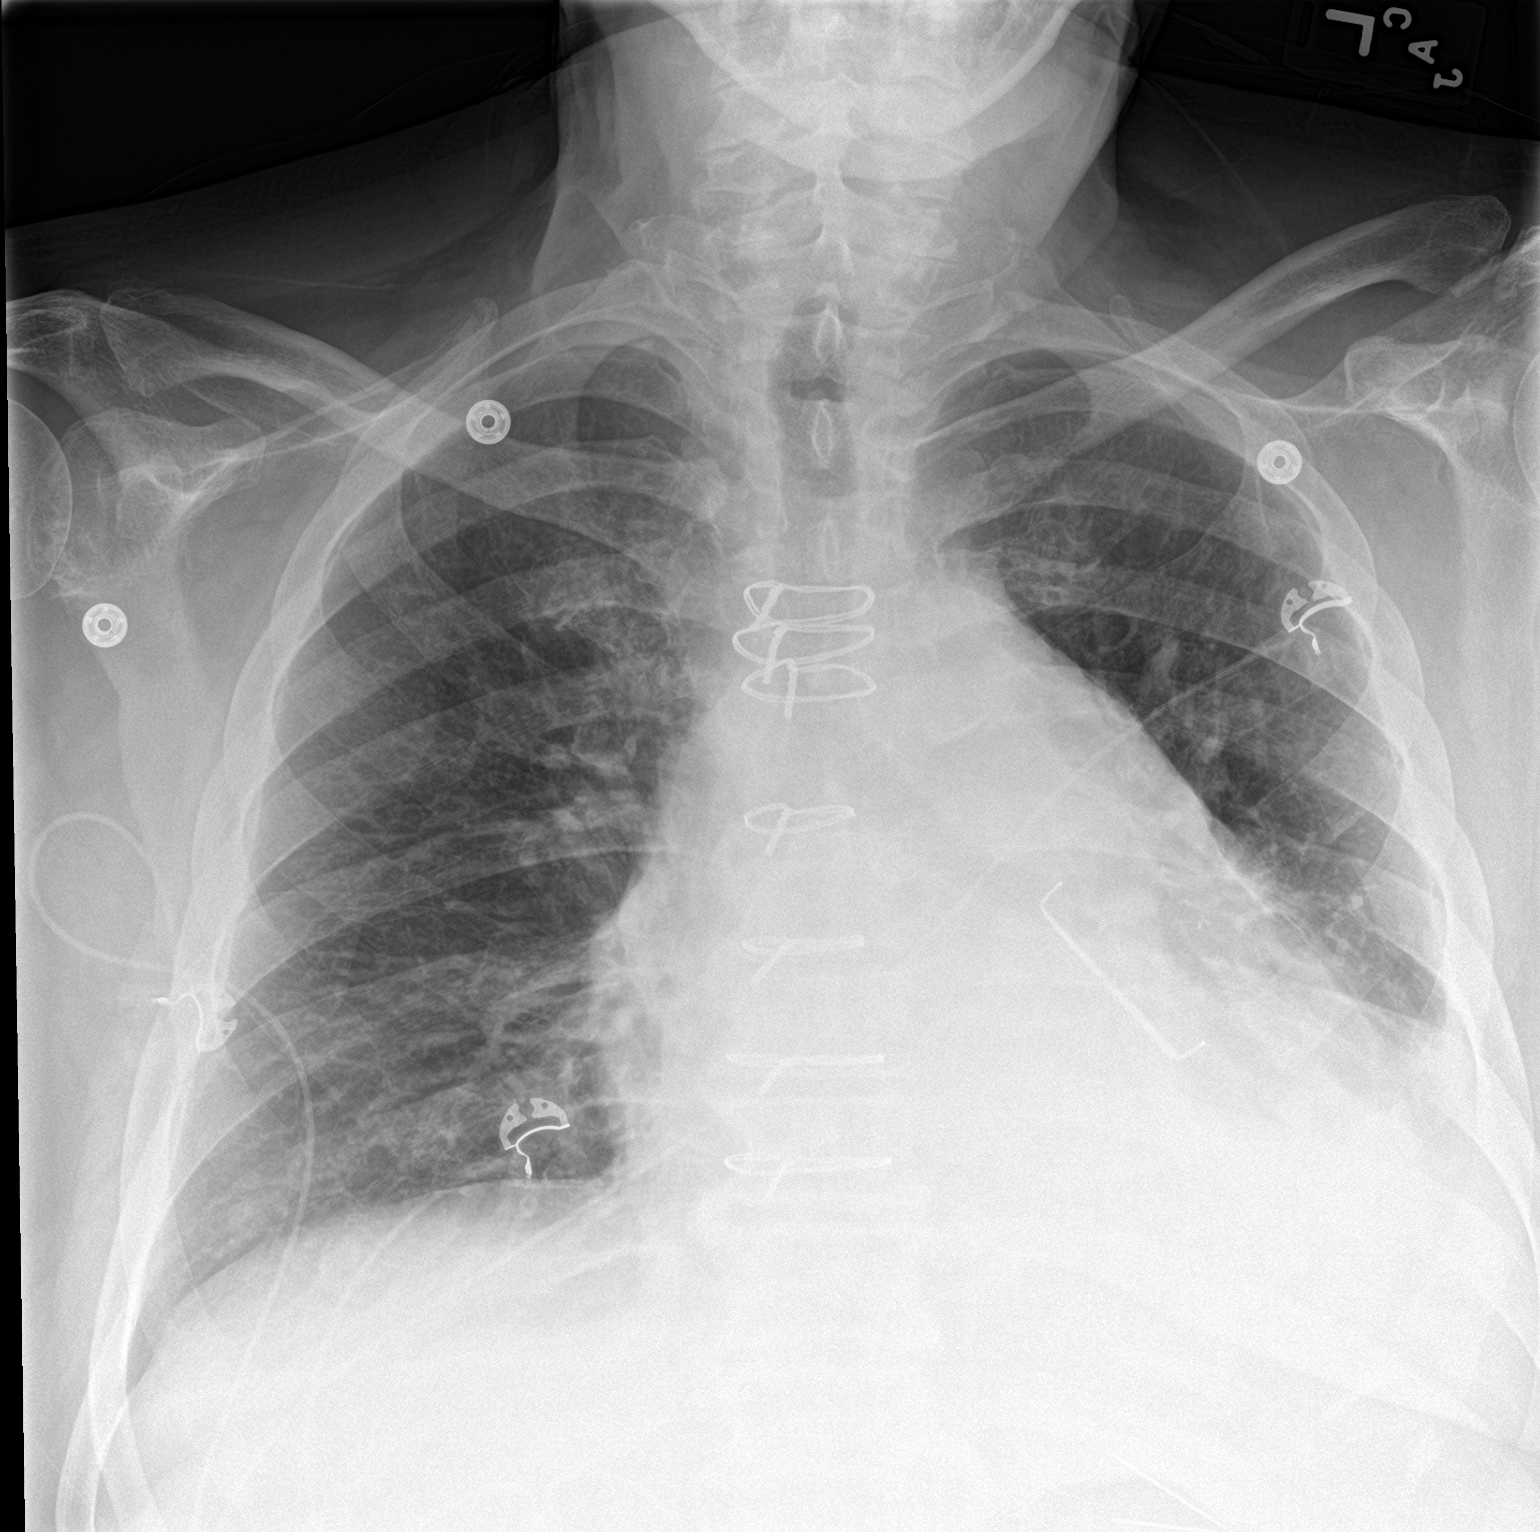

[chest lat]
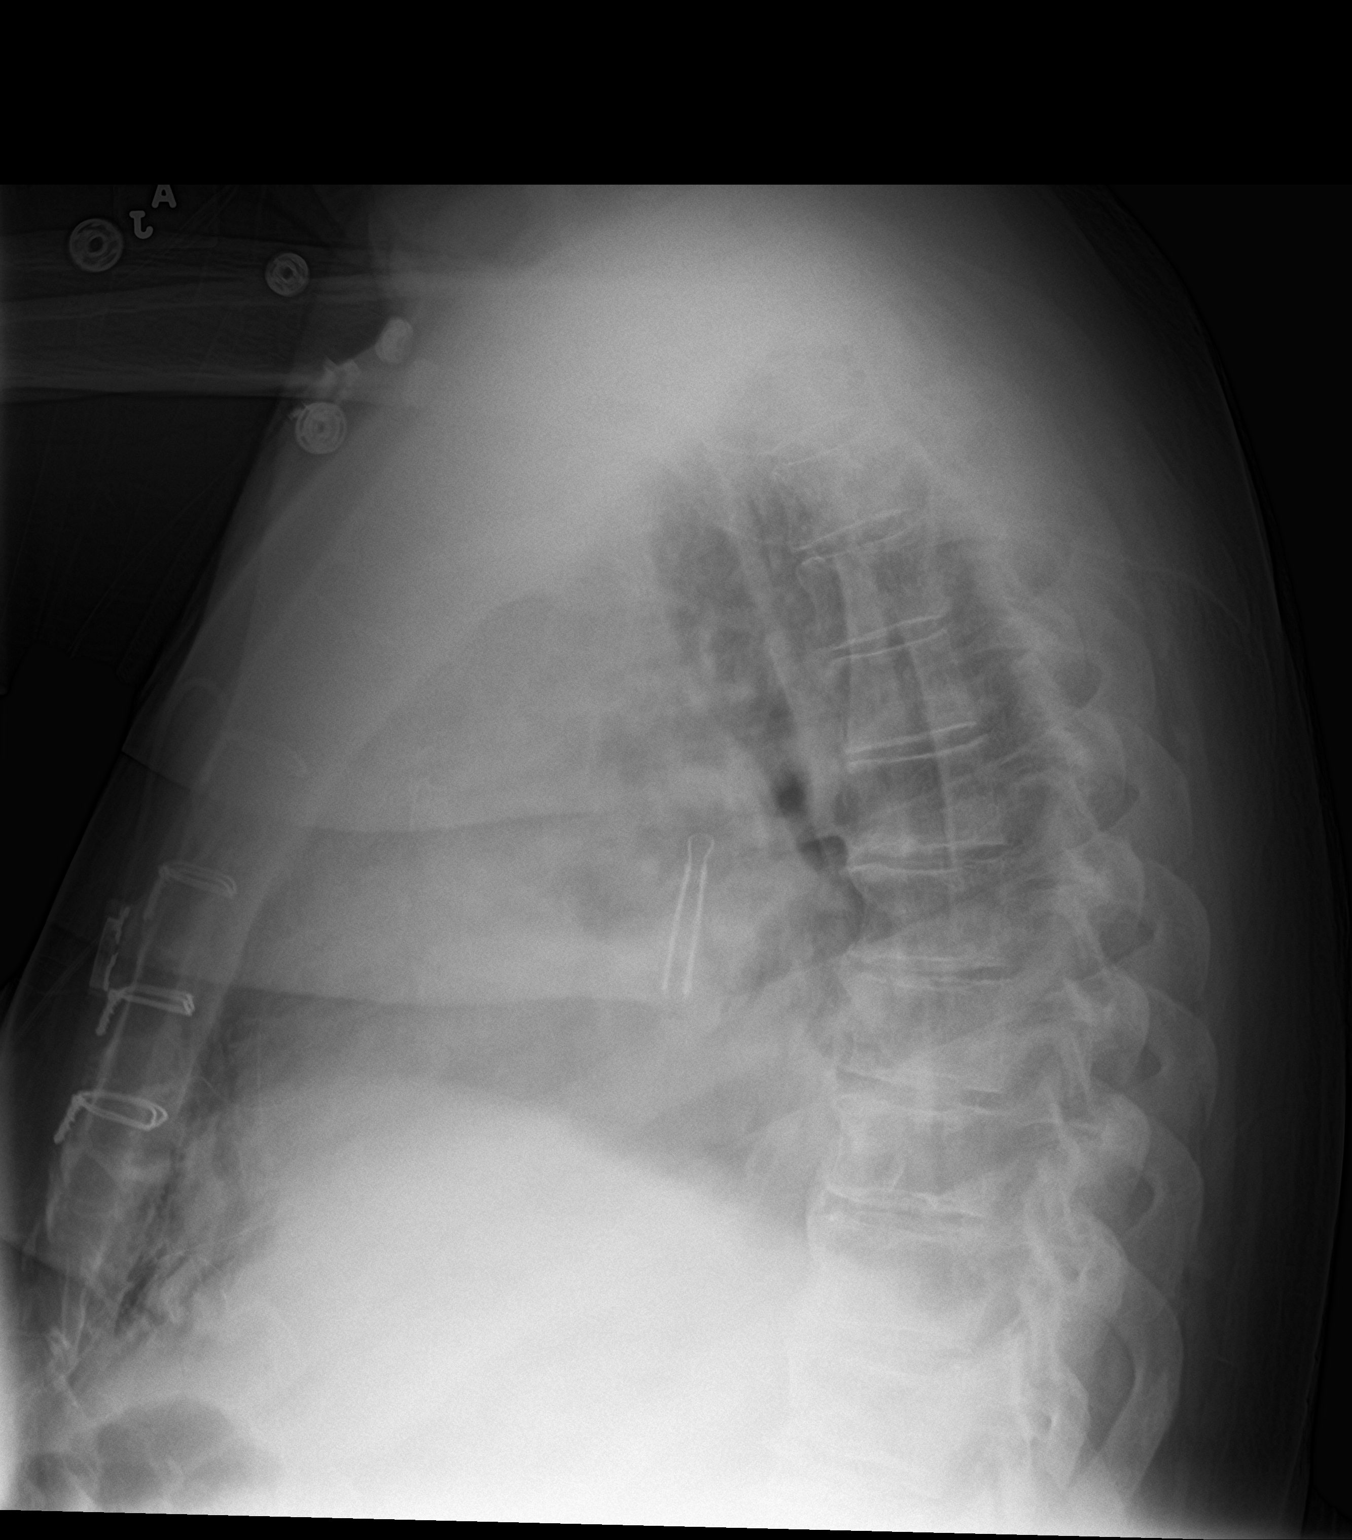

[2 of 2 positions shown; findings below may reference images not displayed]

FINDINGS: Stable cardiomegaly. No pneumothorax is noted. Right lung is clear.
Mild left basilar atelectasis or infiltrate is noted with pleural
effusion. Bony thorax is unremarkable.
IMPRESSION: Mild left basilar atelectasis or infiltrate is noted with pleural
effusion. No other abnormality seen in the chest.

## 2021-07-19 DIAGNOSIS — M109 Gout, unspecified: Secondary | ICD-10-CM | POA: Diagnosis not present

## 2021-07-19 DIAGNOSIS — Z79899 Other long term (current) drug therapy: Secondary | ICD-10-CM | POA: Diagnosis not present

## 2021-07-19 DIAGNOSIS — E1165 Type 2 diabetes mellitus with hyperglycemia: Secondary | ICD-10-CM | POA: Diagnosis not present

## 2021-07-19 DIAGNOSIS — I4891 Unspecified atrial fibrillation: Secondary | ICD-10-CM | POA: Diagnosis not present

## 2021-07-19 DIAGNOSIS — I10 Essential (primary) hypertension: Secondary | ICD-10-CM | POA: Diagnosis not present

## 2021-07-19 DIAGNOSIS — I2510 Atherosclerotic heart disease of native coronary artery without angina pectoris: Secondary | ICD-10-CM | POA: Diagnosis not present

## 2021-07-19 DIAGNOSIS — Z23 Encounter for immunization: Secondary | ICD-10-CM | POA: Diagnosis not present

## 2021-07-19 DIAGNOSIS — E1129 Type 2 diabetes mellitus with other diabetic kidney complication: Secondary | ICD-10-CM | POA: Diagnosis not present

## 2021-07-19 DIAGNOSIS — E785 Hyperlipidemia, unspecified: Secondary | ICD-10-CM | POA: Diagnosis not present

## 2021-07-20 IMAGING — DX DG CHEST 2V
2 series · 2 of 2 positions shown · non-contrast
Comparison: 09/13/2019 chest radiograph.

CLINICAL DATA: Status post 4 vessel CABG

EXAM:
CHEST - 2 VIEW

[dg chest 2 view (1 of 2)]
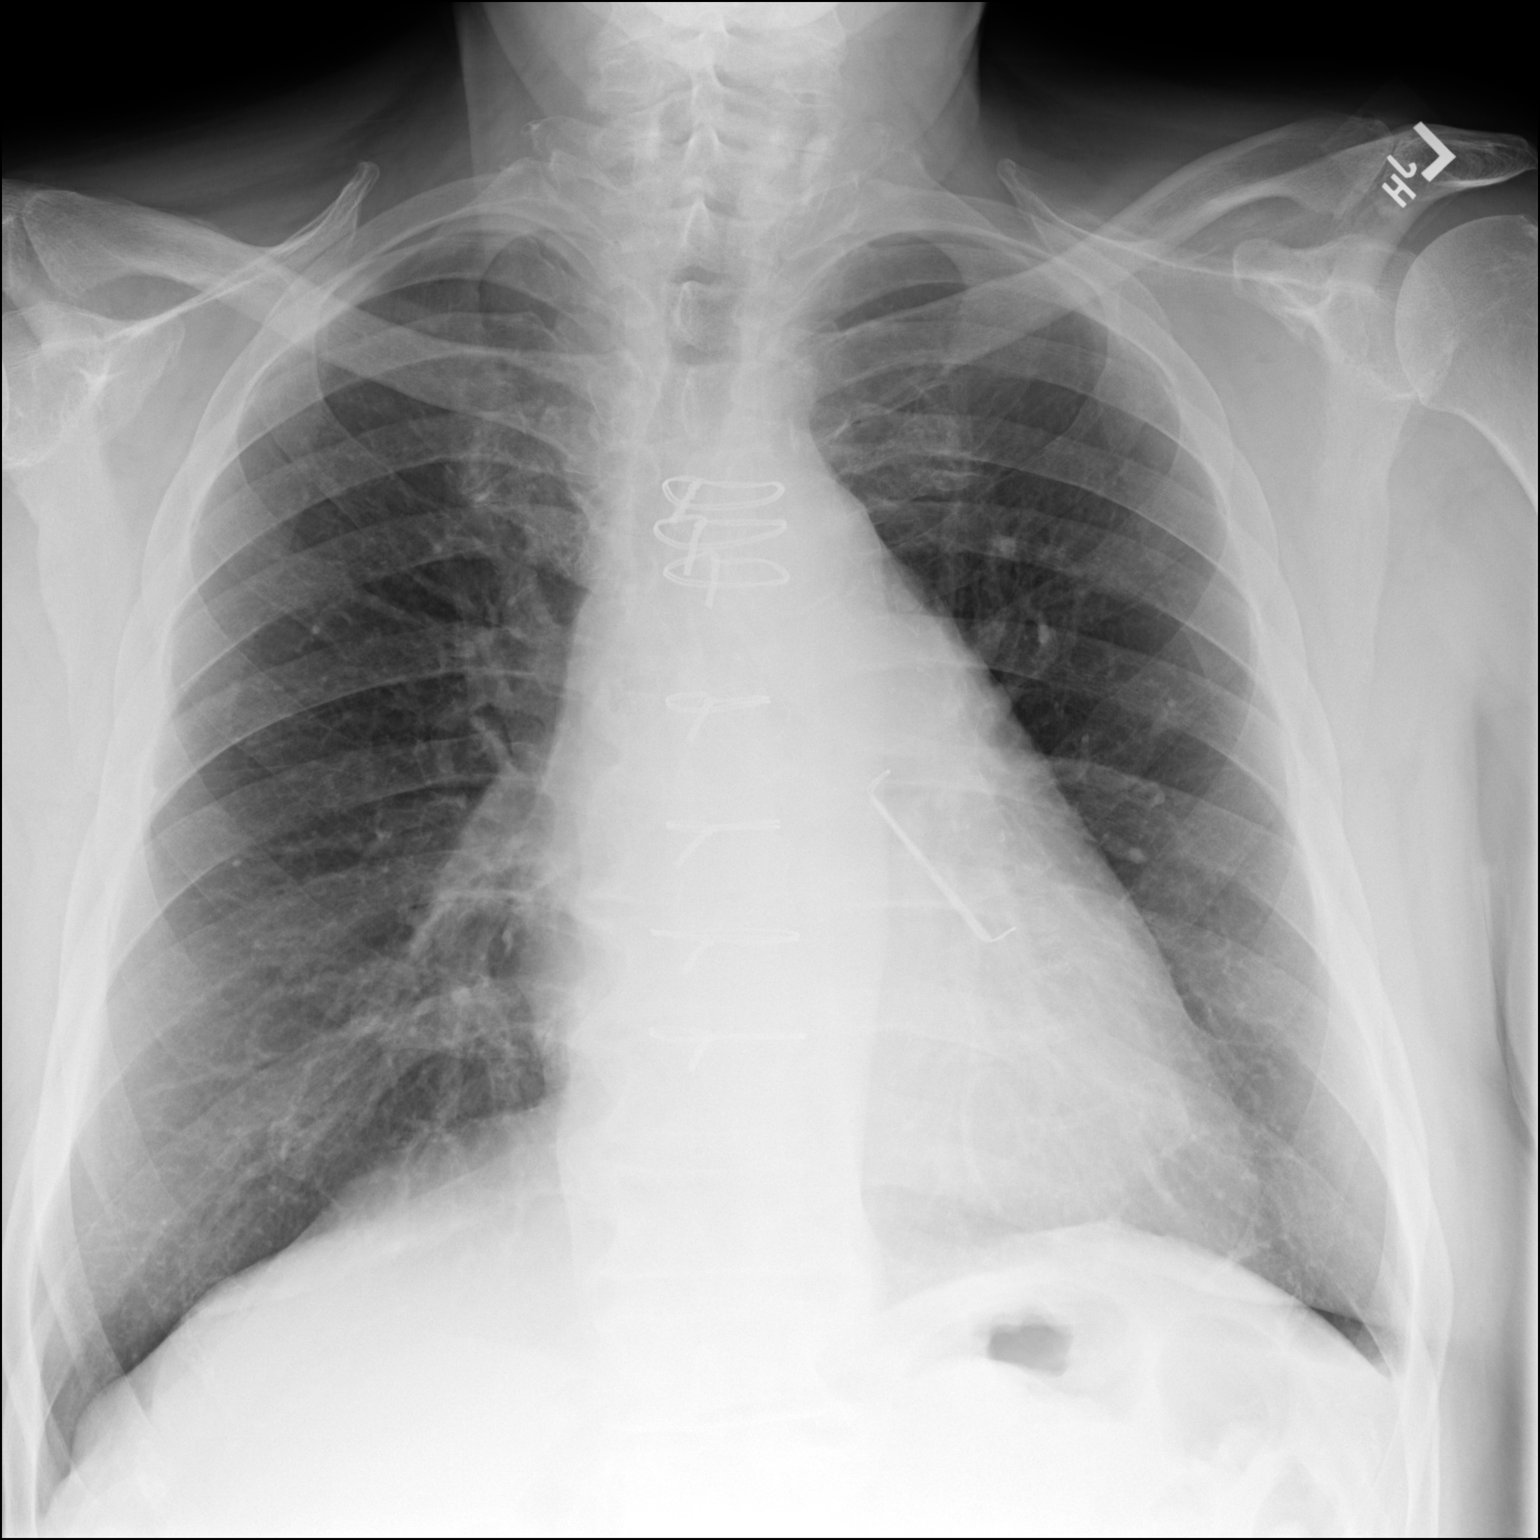

[dg chest 2 view (2 of 2)]
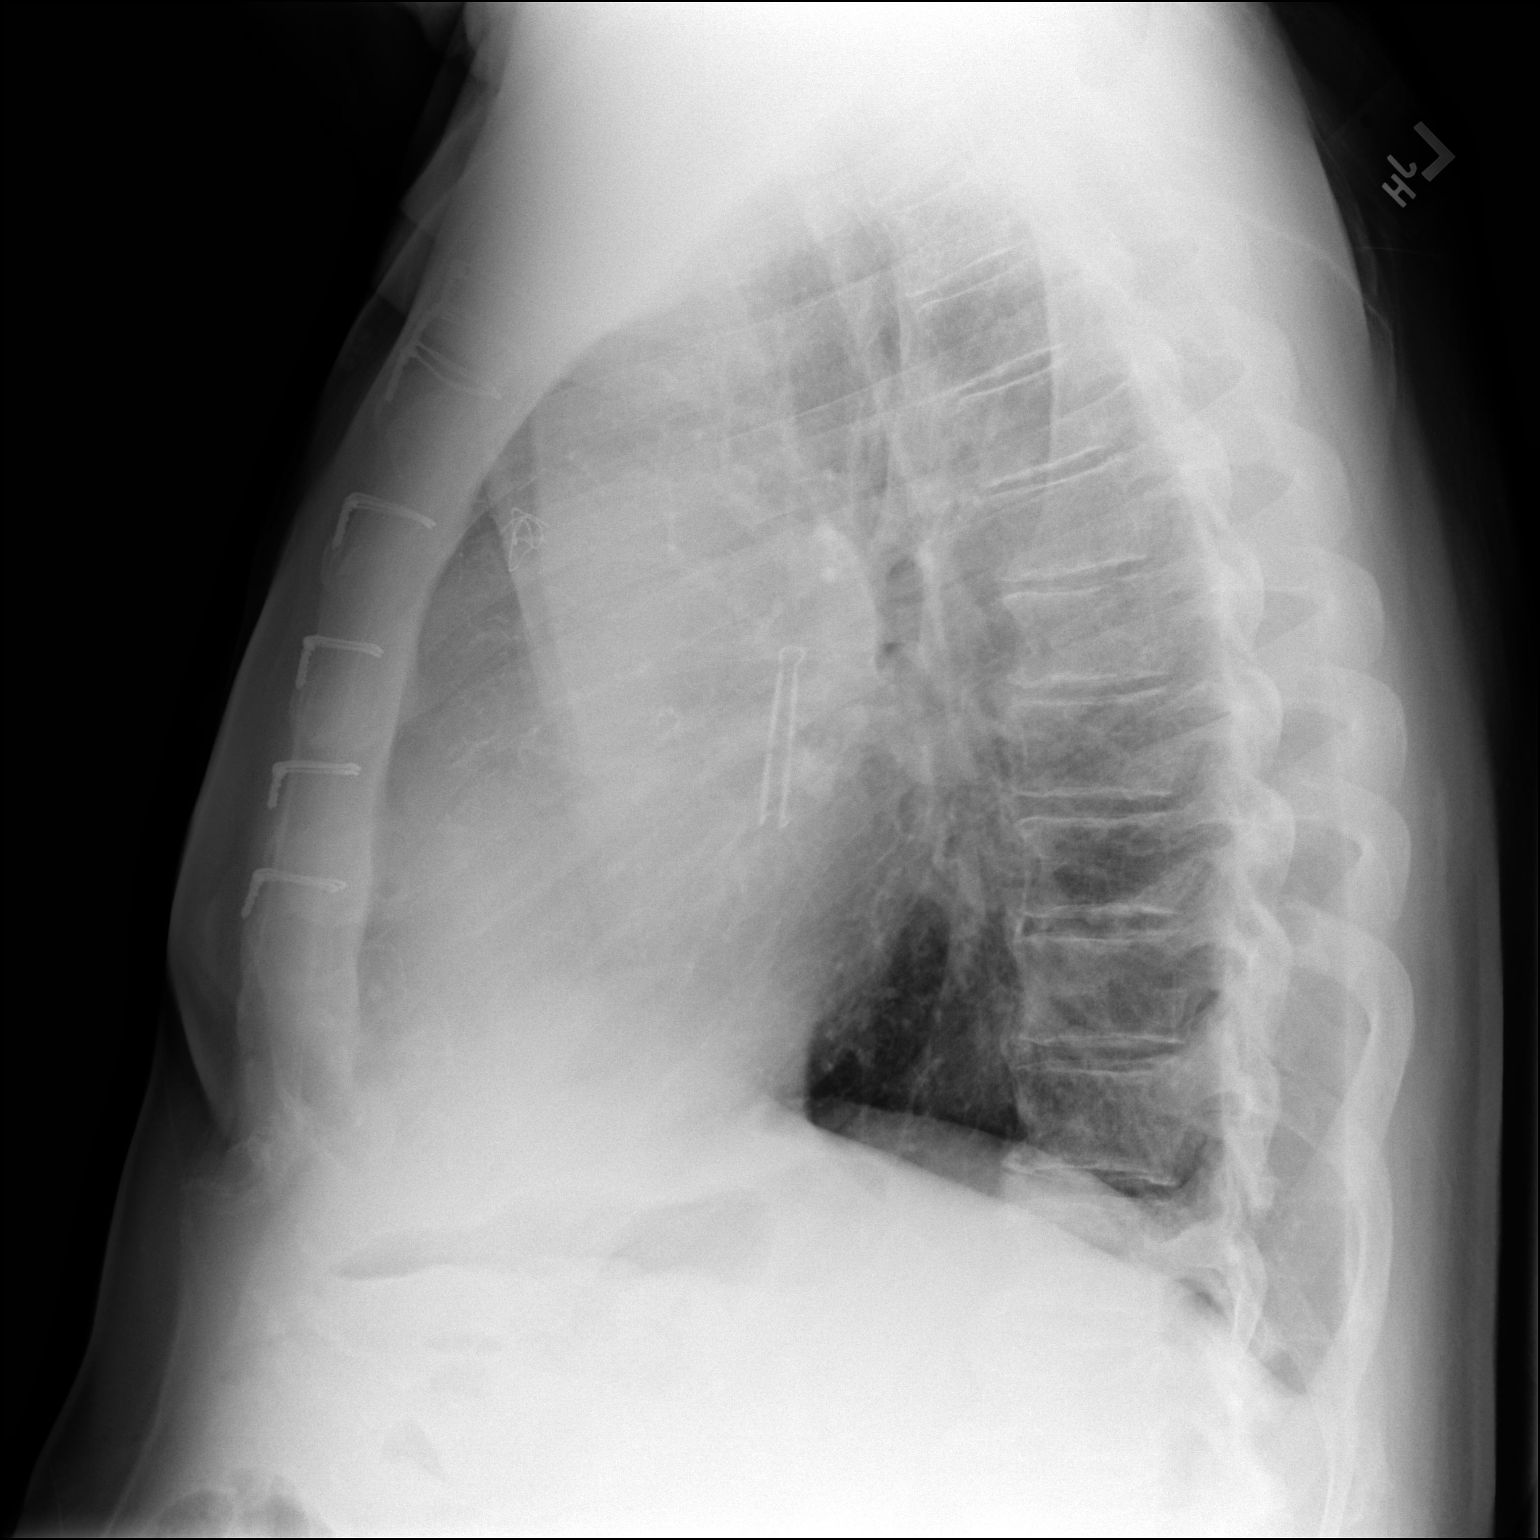

[2 of 2 positions shown; findings below may reference images not displayed]

FINDINGS: Intact sternotomy wires. Stable cardiomediastinal silhouette with
top-normal heart size. No pneumothorax. No pleural effusion. Lungs
appear clear, with no acute consolidative airspace disease and no
pulmonary edema.
IMPRESSION: No active cardiopulmonary disease.

## 2021-07-24 ENCOUNTER — Other Ambulatory Visit: Payer: Self-pay | Admitting: Cardiology

## 2021-08-18 ENCOUNTER — Telehealth: Payer: Self-pay | Admitting: *Deleted

## 2021-08-18 NOTE — Telephone Encounter (Signed)
Patient contacted the office stating he has felt "vibrations" in his chest over the past week. Per patient the vibrations start over his left chest and run across to the center of his chest. Patient had a CABG by Dr. Cyndia Bent 09/10/2019. Patient was advised to contact cardiologist to discuss symptoms. Patient verbalized understanding.

## 2021-10-19 DIAGNOSIS — E1165 Type 2 diabetes mellitus with hyperglycemia: Secondary | ICD-10-CM | POA: Diagnosis not present

## 2021-10-19 DIAGNOSIS — E1129 Type 2 diabetes mellitus with other diabetic kidney complication: Secondary | ICD-10-CM | POA: Diagnosis not present

## 2021-10-19 DIAGNOSIS — M109 Gout, unspecified: Secondary | ICD-10-CM | POA: Diagnosis not present

## 2021-10-19 DIAGNOSIS — I4891 Unspecified atrial fibrillation: Secondary | ICD-10-CM | POA: Diagnosis not present

## 2021-10-19 DIAGNOSIS — E785 Hyperlipidemia, unspecified: Secondary | ICD-10-CM | POA: Diagnosis not present

## 2021-10-19 DIAGNOSIS — I10 Essential (primary) hypertension: Secondary | ICD-10-CM | POA: Diagnosis not present

## 2021-10-19 DIAGNOSIS — I2510 Atherosclerotic heart disease of native coronary artery without angina pectoris: Secondary | ICD-10-CM | POA: Diagnosis not present

## 2021-10-19 DIAGNOSIS — Z79899 Other long term (current) drug therapy: Secondary | ICD-10-CM | POA: Diagnosis not present

## 2021-10-25 ENCOUNTER — Other Ambulatory Visit: Payer: Self-pay

## 2021-10-25 ENCOUNTER — Encounter: Payer: Self-pay | Admitting: Cardiology

## 2021-10-25 ENCOUNTER — Ambulatory Visit: Payer: PPO | Admitting: Cardiology

## 2021-10-25 VITALS — BP 110/74 | HR 85 | Ht 77.0 in | Wt 303.6 lb

## 2021-10-25 DIAGNOSIS — Z9889 Other specified postprocedural states: Secondary | ICD-10-CM

## 2021-10-25 DIAGNOSIS — E119 Type 2 diabetes mellitus without complications: Secondary | ICD-10-CM

## 2021-10-25 DIAGNOSIS — Z951 Presence of aortocoronary bypass graft: Secondary | ICD-10-CM | POA: Diagnosis not present

## 2021-10-25 DIAGNOSIS — I4821 Permanent atrial fibrillation: Secondary | ICD-10-CM

## 2021-10-25 DIAGNOSIS — I2510 Atherosclerotic heart disease of native coronary artery without angina pectoris: Secondary | ICD-10-CM

## 2021-10-25 DIAGNOSIS — I10 Essential (primary) hypertension: Secondary | ICD-10-CM

## 2021-10-25 DIAGNOSIS — Z7901 Long term (current) use of anticoagulants: Secondary | ICD-10-CM

## 2021-10-25 DIAGNOSIS — I7121 Aneurysm of the ascending aorta, without rupture: Secondary | ICD-10-CM | POA: Diagnosis not present

## 2021-10-25 NOTE — Patient Instructions (Signed)

## 2021-10-25 NOTE — Progress Notes (Signed)
Cardiology Office Note:    Date:  10/25/2021   ID:  Ryan Vaughan, DOB November 04, 1945, MRN [id number]  PCP:  Ryan Dress, MD  Cardiologist:  Ryan Lindau, MD   Referring MD: Ryan Dress, MD    ASSESSMENT:    1. 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 Essential hypertension   3. Permanent atrial fibrillation ([place])   4. Chronic anticoagulation   5. Diabetes mellitus without complication ([place])   6. H/O carotid endarterectomy   7. S/P CABG x 4   8. Aneurysm of ascending aorta without rupture    PLAN:    In order of problems listed above:  Coronary artery disease: Secondary prevention stressed with patient.  Importance of compliance with diet medication stressed any vocalized understanding.  He was advised to walk at least half an hour a day 5 days a week to the best of his ability. Essential hypertension: Blood pressure stable and diet was emphasized.  Lifestyle modification urged. Mixed dyslipidemia: Lipids were reviewed and they were done by primary care.  I will try to get a copy of all lab work. Diabetes mellitus\: Hemoglobin A1c is elevated and I counseled him about this.  This is managed by primary care. Obesity: Weight reduction stressed and he promises to do better.  He is already lost a few pounds.  He tells me that he was a little complacent with diet during the holidays. Permanent atrial fibrillation:I discussed with the patient atrial fibrillation, disease process. Management and therapy including rate and rhythm control, anticoagulation benefits and potential risks were discussed extensively with the patient. Patient had multiple questions which were answered to patient's satisfaction. Ascending aortic aneurysm: I discussed about the findings and he wants to wait another 6 months for it to be scanned and I respect his wishes and we will do accordingly in 6 months. Patient will be seen in follow-up  appointment in 6 months or earlier if the patient has any concerns    Medication Adjustments/Labs and Tests Ordered: Current medicines are reviewed at length with the patient today.  Concerns regarding medicines are outlined above.  No orders of the defined types were placed in this encounter.  No orders of the defined types were placed in this encounter.    Chief Complaint  Patient presents with   Follow-up     History of Present Illness:    Ryan Vaughan is a 76 y.o. male.  Patient has past medical history of coronary artery disease, essential hypertension, dyslipidemia, diabetes mellitus, obesity and permanent atrial fibrillation.  He denies any problems at this time and takes care of activities of daily living.  No chest pain orthopnea or PND.  He tries to walk to the best of his ability.  At the time of my evaluation, the patient is alert awake oriented and in no distress.  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   Coronary artery disease    Diabetes mellitus due to underlying condition with unspecified complications ([place]) 63/10/6008   Diabetes mellitus without complication ([place])    Dyslipidemia 05/04/2015   Essential hypertension 04/08/2014   Gout 01/24/2018  H/O carotid endarterectomy 05/14/2017   Hearing loss    bilateral - no hearing aids   Hypertension    Osteoarthritis of left knee 01/24/2018   Permanent atrial fibrillation ([place]) 12/02/2020   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[place])    05/02/17 Renal artery [place] ([facility], Ryan Vaughan): no flow identified in right renal artery origin or in known right renal artery aneurysm, patent artery in the region of mid renal artery could represent a  collateral vessel, stable > 60% left RA stenosis   S/P CABG x 4 09/10/2019   TIA (transient ischemic attack)    due to severe RICA stenosis, s/p right CEA 04/09/14    Past Surgical History:  Procedure Laterality Date   CAROTID ENDARTERECTOMY Right 04/09/2014   CLIPPING OF ATRIAL APPENDAGE N/A 09/10/2019   Procedure: CLIPPING OF ATRIAL APPENDAGE Using AtriCure PRO2 Clip Size 30mm;  Surgeon: Ryan Pollack, MD;  Location: [facility];  Service: Open Heart Surgery;  Laterality: N/A;   COLONOSCOPY  08/06/2012   Large rectal polpy status post piecemeal polypectomy. Ascending colon polyp status post polypectomy.   CORONARY ARTERY BYPASS GRAFT N/A 09/10/2019   Procedure: CORONARY ARTERY BYPASS GRAFTING (CABG) x4 , using left internal mammary artery, and right leg greater saphenous vein harvested endoscopically;  Surgeon: Ryan Pollack, MD;  Location: [facility];  Service: Open Heart Surgery;  Laterality: N/A;   LEFT HEART CATH AND CORONARY ANGIOGRAPHY N/A 08/28/2019   Procedure: LEFT HEART CATH AND CORONARY ANGIOGRAPHY;  Surgeon: Ryan Crome, MD;  Location: [facility];  Service: Cardiovascular;  Laterality: N/A;   SIGMOIDOSCOPY  11/17/2012   Minimal residual polyp, status post polypectomy. Small internal hemorrhoids.   TEE WITHOUT CARDIOVERSION N/A 09/10/2019   Procedure: TRANSESOPHAGEAL ECHOCARDIOGRAM (TEE);  Surgeon: Ryan Pollack, MD;  Location: [facility];  Service: Open Heart Surgery;  Laterality: N/A;    Current Medications: Current Meds  Medication Sig   acetaminophen (TYLENOL) 500 MG tablet Take 1,000-1,500 mg by mouth every 8 (eight) hours as needed for moderate pain or headache.   amLODipine (NORVASC) 5 MG tablet Take 5 mg by mouth daily.   aspirin EC 81 MG tablet Take 81 mg by mouth daily.   atorvastatin (LIPITOR) 80 MG tablet Take 80 mg by mouth daily.   carvedilol (COREG) 12.5 MG tablet Take 1 tablet (12.5 mg total) by mouth 2 (two) times daily.   gabapentin (NEURONTIN) 800 MG  tablet Take 800 mg by mouth as needed for pain.   glimepiride (AMARYL) 4 MG tablet Take 4 mg by mouth at bedtime.    lisinopril (ZESTRIL) 10 MG tablet Take 10 mg by mouth daily.   metFORMIN (GLUCOPHAGE) 1000 MG tablet Take 1,000 mg by mouth 2 (two) times daily with a meal.   nitroGLYCERIN (NITROSTAT) 0.4 MG SL tablet Place 1 tablet (0.4 mg total) under the tongue every 5 (five) minutes as needed for chest pain.   rivaroxaban (XARELTO) 20 MG TABS tablet Take 1 tablet (20 mg total) by mouth daily.   Vitamin D, Ergocalciferol, (DRISDOL) 1.25 MG (50000 UT) CAPS capsule Take 50,000 Units by mouth every Friday.      Allergies:   Penicillins, Amiodarone, Isosorbide mononitrate er [isosorbide dinitrate], and Losartan   Social History   Socioeconomic History   Marital status: Married    Spouse name: Not on file   Number of children: 2   Years of education: Not on file   Highest education level: Not on file  Occupational History  Occupation: Retired  Tobacco Use   Smoking status: Never   Smokeless tobacco: Never  Vaping Use   Vaping Use: Never used  Substance and Sexual Activity   Alcohol use: No   Drug use: No   Sexual activity: Not on file  Other Topics Concern   Not on file  Social History Narrative   Not on file   Social Determinants of Health   Financial Resource Strain: Not on file  Food Insecurity: Not on file  Transportation Needs: Not on file  Physical Activity: Not on file  Stress: Not on file  Social Connections: Not on file     Family History: The patient's family history includes Hypertension in his mother. There is no history of Colon cancer, Stomach cancer, Liver cancer, or Throat cancer.  ROS:   Please see the history of present illness.    All other systems reviewed and are negative.  EKGs/Labs/Other Studies Reviewed:    The following studies were reviewed today: IMPRESSIONS     1. Left ventricular ejection fraction, by estimation, is 55 to 60%. The   left ventricle has normal function. The left ventricle has no regional  wall motion abnormalities. Left ventricular diastolic parameters are  indeterminate.   2. Right ventricular systolic function is normal. The right ventricular  size is normal. There is normal pulmonary artery systolic pressure.   3. Left atrial size was moderately dilated.   4. The mitral valve is normal in structure. Mild mitral valve  regurgitation. No evidence of mitral stenosis.   5. The aortic valve is normal in structure. Aortic valve regurgitation is  not visualized. No aortic stenosis is present.   6. There is mild dilatation of the ascending aorta, measuring 41 mm.   7. The inferior vena cava is normal in size with greater than 50%  respiratory variability, suggesting right atrial pressure of 3 mmHg.    Recent Labs: No results found for requested labs within last 8760 hours.  Recent Lipid Panel    Component Value Date/Time   CHOL 131 11/05/2019 0847   TRIG 52 11/05/2019 0847   HDL 46 11/05/2019 0847   CHOLHDL 2.8 11/05/2019 0847   LDLCALC 73 11/05/2019 0847    Physical Exam:    VS:  BP 110/74 (BP Location: Right Arm, Patient Position: Sitting)    Pulse 85    Ht 6\' 5"  (1.956 m)    Wt (!) 303 lb 9.6 oz (137.7 kg)    SpO2 95%    BMI 36.00 kg/m     Wt Readings from Last 3 Encounters:  10/25/21 (!) 303 lb 9.6 oz (137.7 kg)  05/02/21 (!) 311 lb (141.1 kg)  04/20/21 (!) 315 lb 12.8 oz (143.2 kg)     GEN: Patient is in no acute distress HEENT: Normal NECK: No JVD; No carotid bruits LYMPHATICS: No lymphadenopathy CARDIAC: Hear sounds regular, 2/6 systolic murmur at the apex. RESPIRATORY:  Clear to auscultation without rales, wheezing or rhonchi  ABDOMEN: Soft, non-tender, non-distended MUSCULOSKELETAL:  No edema; No deformity  SKIN: Warm and dry NEUROLOGIC:  Alert and oriented x 3 PSYCHIATRIC:  Normal affect   Signed, Ryan Lindau, MD  10/25/2021 8:34 AM    [place]

## 2022-01-17 DIAGNOSIS — E119 Type 2 diabetes mellitus without complications: Secondary | ICD-10-CM | POA: Diagnosis not present

## 2022-01-17 DIAGNOSIS — Z7984 Long term (current) use of oral hypoglycemic drugs: Secondary | ICD-10-CM | POA: Diagnosis not present

## 2022-01-17 DIAGNOSIS — Z7901 Long term (current) use of anticoagulants: Secondary | ICD-10-CM | POA: Diagnosis not present

## 2022-01-17 DIAGNOSIS — Z6834 Body mass index (BMI) 34.0-34.9, adult: Secondary | ICD-10-CM | POA: Diagnosis not present

## 2022-01-17 DIAGNOSIS — E785 Hyperlipidemia, unspecified: Secondary | ICD-10-CM | POA: Diagnosis not present

## 2022-01-17 DIAGNOSIS — Z8679 Personal history of other diseases of the circulatory system: Secondary | ICD-10-CM | POA: Diagnosis not present

## 2022-01-17 DIAGNOSIS — I10 Essential (primary) hypertension: Secondary | ICD-10-CM | POA: Diagnosis not present

## 2022-01-17 DIAGNOSIS — Z9889 Other specified postprocedural states: Secondary | ICD-10-CM | POA: Diagnosis not present

## 2022-01-17 DIAGNOSIS — D6869 Other thrombophilia: Secondary | ICD-10-CM | POA: Diagnosis not present

## 2022-01-17 DIAGNOSIS — E669 Obesity, unspecified: Secondary | ICD-10-CM | POA: Diagnosis not present

## 2022-01-17 DIAGNOSIS — I722 Aneurysm of renal artery: Secondary | ICD-10-CM | POA: Diagnosis not present

## 2022-01-17 DIAGNOSIS — I480 Paroxysmal atrial fibrillation: Secondary | ICD-10-CM | POA: Diagnosis not present

## 2022-01-30 DIAGNOSIS — I4891 Unspecified atrial fibrillation: Secondary | ICD-10-CM | POA: Diagnosis not present

## 2022-01-30 DIAGNOSIS — I10 Essential (primary) hypertension: Secondary | ICD-10-CM | POA: Diagnosis not present

## 2022-01-30 DIAGNOSIS — E1165 Type 2 diabetes mellitus with hyperglycemia: Secondary | ICD-10-CM | POA: Diagnosis not present

## 2022-01-30 DIAGNOSIS — M109 Gout, unspecified: Secondary | ICD-10-CM | POA: Diagnosis not present

## 2022-01-30 DIAGNOSIS — E1129 Type 2 diabetes mellitus with other diabetic kidney complication: Secondary | ICD-10-CM | POA: Diagnosis not present

## 2022-01-30 DIAGNOSIS — E785 Hyperlipidemia, unspecified: Secondary | ICD-10-CM | POA: Diagnosis not present

## 2022-01-30 DIAGNOSIS — Z79899 Other long term (current) drug therapy: Secondary | ICD-10-CM | POA: Diagnosis not present

## 2022-01-30 DIAGNOSIS — I2510 Atherosclerotic heart disease of native coronary artery without angina pectoris: Secondary | ICD-10-CM | POA: Diagnosis not present

## 2022-02-02 DIAGNOSIS — Z1331 Encounter for screening for depression: Secondary | ICD-10-CM | POA: Diagnosis not present

## 2022-02-02 DIAGNOSIS — E785 Hyperlipidemia, unspecified: Secondary | ICD-10-CM | POA: Diagnosis not present

## 2022-02-02 DIAGNOSIS — Z139 Encounter for screening, unspecified: Secondary | ICD-10-CM | POA: Diagnosis not present

## 2022-02-02 DIAGNOSIS — Z0000 Encounter for general adult medical examination without abnormal findings: Secondary | ICD-10-CM | POA: Diagnosis not present

## 2022-02-02 DIAGNOSIS — Z9181 History of falling: Secondary | ICD-10-CM | POA: Diagnosis not present

## 2022-02-02 DIAGNOSIS — Z6837 Body mass index (BMI) 37.0-37.9, adult: Secondary | ICD-10-CM | POA: Diagnosis not present

## 2022-02-02 DIAGNOSIS — E669 Obesity, unspecified: Secondary | ICD-10-CM | POA: Diagnosis not present

## 2022-05-03 DIAGNOSIS — E785 Hyperlipidemia, unspecified: Secondary | ICD-10-CM | POA: Diagnosis not present

## 2022-05-03 DIAGNOSIS — M19041 Primary osteoarthritis, right hand: Secondary | ICD-10-CM | POA: Diagnosis not present

## 2022-05-03 DIAGNOSIS — M19042 Primary osteoarthritis, left hand: Secondary | ICD-10-CM | POA: Diagnosis not present

## 2022-05-03 DIAGNOSIS — Z79899 Other long term (current) drug therapy: Secondary | ICD-10-CM | POA: Diagnosis not present

## 2022-05-03 DIAGNOSIS — I2510 Atherosclerotic heart disease of native coronary artery without angina pectoris: Secondary | ICD-10-CM | POA: Diagnosis not present

## 2022-05-03 DIAGNOSIS — I10 Essential (primary) hypertension: Secondary | ICD-10-CM | POA: Diagnosis not present

## 2022-05-03 DIAGNOSIS — I4891 Unspecified atrial fibrillation: Secondary | ICD-10-CM | POA: Diagnosis not present

## 2022-05-03 DIAGNOSIS — Z125 Encounter for screening for malignant neoplasm of prostate: Secondary | ICD-10-CM | POA: Diagnosis not present

## 2022-05-03 DIAGNOSIS — M109 Gout, unspecified: Secondary | ICD-10-CM | POA: Diagnosis not present

## 2022-05-03 DIAGNOSIS — M170 Bilateral primary osteoarthritis of knee: Secondary | ICD-10-CM | POA: Diagnosis not present

## 2022-05-03 DIAGNOSIS — E1165 Type 2 diabetes mellitus with hyperglycemia: Secondary | ICD-10-CM | POA: Diagnosis not present

## 2022-05-03 DIAGNOSIS — E1129 Type 2 diabetes mellitus with other diabetic kidney complication: Secondary | ICD-10-CM | POA: Diagnosis not present

## 2022-05-30 DIAGNOSIS — H40053 Ocular hypertension, bilateral: Secondary | ICD-10-CM | POA: Diagnosis not present

## 2022-05-30 DIAGNOSIS — H524 Presbyopia: Secondary | ICD-10-CM | POA: Diagnosis not present

## 2022-06-29 DIAGNOSIS — Z0289 Encounter for other administrative examinations: Secondary | ICD-10-CM | POA: Diagnosis not present

## 2022-07-19 DIAGNOSIS — E119 Type 2 diabetes mellitus without complications: Secondary | ICD-10-CM | POA: Diagnosis not present

## 2022-07-19 DIAGNOSIS — I10 Essential (primary) hypertension: Secondary | ICD-10-CM | POA: Diagnosis not present

## 2022-07-19 DIAGNOSIS — Z7901 Long term (current) use of anticoagulants: Secondary | ICD-10-CM | POA: Diagnosis not present

## 2022-07-19 DIAGNOSIS — I480 Paroxysmal atrial fibrillation: Secondary | ICD-10-CM | POA: Diagnosis not present

## 2022-07-19 DIAGNOSIS — Z6837 Body mass index (BMI) 37.0-37.9, adult: Secondary | ICD-10-CM | POA: Diagnosis not present

## 2022-07-19 DIAGNOSIS — D6869 Other thrombophilia: Secondary | ICD-10-CM | POA: Diagnosis not present

## 2022-07-19 DIAGNOSIS — Z7984 Long term (current) use of oral hypoglycemic drugs: Secondary | ICD-10-CM | POA: Diagnosis not present

## 2022-07-25 ENCOUNTER — Other Ambulatory Visit: Payer: Self-pay

## 2022-08-03 ENCOUNTER — Encounter: Payer: Self-pay | Admitting: Cardiology

## 2022-08-03 ENCOUNTER — Ambulatory Visit: Payer: PPO | Attending: Cardiology | Admitting: Cardiology

## 2022-08-03 VITALS — BP 154/94 | HR 75 | Ht 77.0 in | Wt 307.0 lb

## 2022-08-03 DIAGNOSIS — E088 Diabetes mellitus due to underlying condition with unspecified complications: Secondary | ICD-10-CM | POA: Diagnosis not present

## 2022-08-03 DIAGNOSIS — I2510 Atherosclerotic heart disease of native coronary artery without angina pectoris: Secondary | ICD-10-CM

## 2022-08-03 DIAGNOSIS — I10 Essential (primary) hypertension: Secondary | ICD-10-CM

## 2022-08-03 DIAGNOSIS — Z9889 Other specified postprocedural states: Secondary | ICD-10-CM | POA: Diagnosis not present

## 2022-08-03 DIAGNOSIS — I7121 Aneurysm of the ascending aorta, without rupture: Secondary | ICD-10-CM | POA: Diagnosis not present

## 2022-08-03 DIAGNOSIS — Z951 Presence of aortocoronary bypass graft: Secondary | ICD-10-CM | POA: Diagnosis not present

## 2022-08-03 DIAGNOSIS — I4821 Permanent atrial fibrillation: Secondary | ICD-10-CM

## 2022-08-03 NOTE — Progress Notes (Signed)
Cardiology Office Note:    Date:  08/03/2022   ID:  Ryan Vaughan, DOB Mar 23, 1946, MRN [id number]  PCP:  Nicoletta Dress, MD  Cardiologist:  Jenean Lindau, MD   Referring MD: Nicoletta Dress, MD    ASSESSMENT:    1. Permanent atrial fibrillation ([place])   2. Essential hypertension   3. Coronary artery disease involving native coronary artery of native heart without angina pectoris   4. Aneurysm of ascending aorta without rupture ([place])   5. Diabetes mellitus due to underlying condition with unspecified complications ([place])   6. S/P CABG x 4   7. H/O carotid endarterectomy    PLAN:    In order of problems listed above:  Coronary artery disease: Secondary prevention stressed with the patient.  Importance of compliance with diet medication stressed any vocalized understanding.  He was advised to walk at least half an hour a day 5 days a week and he promises to do so. Essential hypertension: Blood pressure is stable and diet was emphasized.  Lifestyle modification urged.  He has an element of whitecoat hypertension.  He mentions to me that his blood pressures at home are stable. Mixed dyslipidemia: On lipid-lowering medications.  Lipids reviewed from primary care.  I discussed them with him. Permanent atrial fibrillation:I discussed with the patient atrial fibrillation, disease process. Management and therapy including rate and rhythm control, anticoagulation benefits and potential risks were discussed extensively with the patient. Patient had multiple questions which were answered to patient's satisfaction. Diabetes mellitus and obesity: Weight reduction stressed diet emphasized and he promises to do better.  Risks of obesity explained. Ascending aortic aneurysm: We will do a CT scan without contrast to follow-up.  Previous CT reports were discussed with him at length and questions were answered to his satisfaction. Patient will be seen in follow-up appointment in 9 months or  earlier if the patient has any concerns    Medication Adjustments/Labs and Tests Ordered: Current medicines are reviewed at length with the patient today.  Concerns regarding medicines are outlined above.  No orders of the defined types were placed in this encounter.  No orders of the defined types were placed in this encounter.    No chief complaint on file.    History of Present Illness:    Ryan Vaughan is a 76 y.o. male.  Patient has past medical history of coronary artery disease post CABG surgery, essential hypertension, mixed dyslipidemia, diabetes mellitus and obesity.  He denies any problems at this time and takes care of activities of daily living.  No chest pain orthopnea or PND.  At the time of my evaluation, the patient is alert awake oriented and in no distress.  He leads a sedentary lifestyle and has gained weight.  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 Normajean Baxter): no flow identified in right renal artery origin or in known right renal artery aneurysm, patent artery in the region of mid renal artery could represent a collateral vessel, stable > 60% left RA  stenosis   S/P CABG x 4 09/10/2019   TIA (transient ischemic attack)    due to severe RICA stenosis, s/p right CEA 04/09/14    Past Surgical History:  Procedure Laterality Date   CAROTID ENDARTERECTOMY Right 04/09/2014   CLIPPING OF ATRIAL APPENDAGE N/A 09/10/2019   Procedure: CLIPPING OF ATRIAL APPENDAGE Using AtriCure PRO2 Clip Size 50m;  Surgeon: BGaye Pollack MD;  [facility]  Service: Open Heart Surgery;  Laterality: N/A;   COLONOSCOPY  08/06/2012   Large rectal polpy status post piecemeal polypectomy. Ascending colon polyp status post polypectomy.   CORONARY ARTERY BYPASS GRAFT N/A 09/10/2019   Procedure: CORONARY ARTERY BYPASS GRAFTING (CABG) x4 , using left internal mammary artery, and right leg greater saphenous vein harvested endoscopically;  Surgeon: BGaye Pollack MD;  [facility];  Service: Open Heart Surgery;  Laterality: N/A;   LEFT HEART CATH AND CORONARY ANGIOGRAPHY N/A 08/28/2019   Procedure: LEFT HEART CATH AND CORONARY ANGIOGRAPHY;  Surgeon: SBelva Crome MD;  [facility];  Service: Cardiovascular;  Laterality: N/A;   SIGMOIDOSCOPY  11/17/2012   Minimal residual polyp, status post polypectomy. Small internal hemorrhoids.   TEE WITHOUT CARDIOVERSION N/A 09/10/2019   Procedure: TRANSESOPHAGEAL ECHOCARDIOGRAM (TEE);  Surgeon: BGaye Pollack MD;  [facility]  Service: Open Heart Surgery;  Laterality: N/A;    Current Medications: Current Meds  Medication Sig   acetaminophen (TYLENOL) 500 MG tablet Take 1,000-1,500 mg by mouth every 8 (eight) hours as needed for moderate pain or headache.   amLODipine (NORVASC) 5 MG tablet Take 5 mg by mouth daily.   aspirin EC 81 MG tablet Take 81 mg by mouth daily.   atorvastatin (LIPITOR) 80 MG tablet Take 80 mg by mouth daily.   carvedilol (COREG) 12.5 MG tablet Take 1 tablet (12.5 mg total) by mouth 2 (two) times daily.   gabapentin (NEURONTIN) 800 MG tablet Take 800 mg by mouth as needed for  pain.   glimepiride (AMARYL) 4 MG tablet Take 4 mg by mouth at bedtime.    LANTUS SOLOSTAR 100 UNIT/ML Solostar Pen Inject 20 Units into the skin at bedtime.   lisinopril (ZESTRIL) 10 MG tablet Take 10 mg by mouth daily.   nitroGLYCERIN (NITROSTAT) 0.4 MG SL tablet Place 1 tablet (0.4 mg total) under the tongue every 5 (five) minutes as needed for chest pain.   rivaroxaban (XARELTO) 20 MG TABS tablet Take 1 tablet (20 mg total) by mouth daily.   valsartan (DIOVAN) 320 MG tablet Take 320 mg by mouth daily.   Vitamin D, Ergocalciferol, (DRISDOL) 1.25 MG (50000 UT) CAPS capsule Take 50,000 Units by mouth every Friday.      Allergies:   Penicillins, Amiodarone, Isosorbide mononitrate er [isosorbide dinitrate], and Losartan   Social History   Socioeconomic History   Marital status: Married    Spouse name: Not on file   Number of children: 2   Years of education: Not on file   Highest education level: Not on file  Occupational History   Occupation: Retired  Tobacco Use   Smoking status: Never  Smokeless tobacco: Never  Vaping Use   Vaping Use: Never used  Substance and Sexual Activity   Alcohol use: No   Drug use: No   Sexual activity: Not on file  Other Topics Concern   Not on file  Social History Narrative   Not on file   Social Determinants of Health   Financial Resource Strain: Not on file  Food Insecurity: Not on file  Transportation Needs: Not on file  Physical Activity: Not on file  Stress: Not on file  Social Connections: Not on file     Family History: The patient's family history includes Hypertension in his mother. There is no history of Colon cancer, Stomach cancer, Liver cancer, or Throat cancer.  ROS:   Please see the history of present illness.    All other systems reviewed and are negative.  EKGs/Labs/Other Studies Reviewed:    The following studies were reviewed today: EKG reveals atrial for patient with well-controlled ventricular rate.   Recent  Labs: No results found for requested labs within last 365 days.  Recent Lipid Panel    Component Value Date/Time   CHOL 131 11/05/2019 0847   TRIG 52 11/05/2019 0847   HDL 46 11/05/2019 0847   CHOLHDL 2.8 11/05/2019 0847   LDLCALC 73 11/05/2019 0847    Physical Exam:    VS:  BP (!) 154/94   Pulse 75   Ht '6\' 5"'$  (1.956 m)   Wt (!) 307 lb (139.3 kg)   SpO2 97%   BMI 36.40 kg/m     Wt Readings from Last 3 Encounters:  08/03/22 (!) 307 lb (139.3 kg)  10/25/21 (!) 303 lb 9.6 oz (137.7 kg)  05/02/21 (!) 311 lb (141.1 kg)     GEN: Patient is in no acute distress HEENT: Normal NECK: No JVD; No carotid bruits LYMPHATICS: No lymphadenopathy CARDIAC: Hear sounds regular, 2/6 systolic murmur at the apex. RESPIRATORY:  Clear to auscultation without rales, wheezing or rhonchi  ABDOMEN: Soft, non-tender, non-distended MUSCULOSKELETAL:  No edema; No deformity  SKIN: Warm and dry NEUROLOGIC:  Alert and oriented x 3 PSYCHIATRIC:  Normal affect   Signed, Jenean Lindau, MD  08/03/2022 8:56 AM    [place]

## 2022-08-03 NOTE — Patient Instructions (Addendum)
Medication Instructions:  Your physician recommends that you continue on your current medications as directed. Please refer to the Current Medication list given to you today.  *If you need a refill on your cardiac medications before your next appointment, please call your pharmacy*   Lab Work: None Ordered If you have labs (blood work) drawn today and your tests are completely normal, you will receive your results only by: [place] (if you have MyChart) OR A paper copy in the mail If you have any lab test that is abnormal or we need to change your treatment, we will call you to review the results.   Testing/Procedures: Non-Cardiac CT scanning, (CAT scanning), is a noninvasive, special x-ray that produces cross-sectional images of the body using x-rays and a computer. CT scans help physicians diagnose and treat medical conditions. For some CT exams, a contrast material is used to enhance visibility in the area of the body being studied. CT scans provide greater clarity and reveal more details than regular x-ray exams.    Follow-Up: At [facility], you and your health needs are our priority.  As part of our continuing mission to provide you with exceptional heart care, we have created designated Provider Care Teams.  These Care Teams include your primary Cardiologist (physician) and Advanced Practice Providers (APPs -  Physician Assistants and Nurse Practitioners) who all work together to provide you with the care you need, when you need it.  We recommend signing up for the patient portal called "MyChart".  Sign up information is provided on this After Visit Summary.  MyChart is used to connect with patients for Virtual Visits (Telemedicine).  Patients are able to view lab/test results, encounter notes, upcoming appointments, etc.  Non-urgent messages can be sent to your provider as well.   To learn more about what you can do with MyChart, go to [email or web address].    Your next  appointment:   9 month(s)  The format for your next appointment:   In Person  Provider:   Jyl Heinz, MD    Other Instructions NA

## 2022-08-06 DIAGNOSIS — E785 Hyperlipidemia, unspecified: Secondary | ICD-10-CM | POA: Diagnosis not present

## 2022-08-06 DIAGNOSIS — E1165 Type 2 diabetes mellitus with hyperglycemia: Secondary | ICD-10-CM | POA: Diagnosis not present

## 2022-08-06 DIAGNOSIS — Z23 Encounter for immunization: Secondary | ICD-10-CM | POA: Diagnosis not present

## 2022-08-06 DIAGNOSIS — I2510 Atherosclerotic heart disease of native coronary artery without angina pectoris: Secondary | ICD-10-CM | POA: Diagnosis not present

## 2022-08-06 DIAGNOSIS — I10 Essential (primary) hypertension: Secondary | ICD-10-CM | POA: Diagnosis not present

## 2022-08-06 DIAGNOSIS — M170 Bilateral primary osteoarthritis of knee: Secondary | ICD-10-CM | POA: Diagnosis not present

## 2022-08-06 DIAGNOSIS — M19042 Primary osteoarthritis, left hand: Secondary | ICD-10-CM | POA: Diagnosis not present

## 2022-08-06 DIAGNOSIS — Z79899 Other long term (current) drug therapy: Secondary | ICD-10-CM | POA: Diagnosis not present

## 2022-08-06 DIAGNOSIS — I4891 Unspecified atrial fibrillation: Secondary | ICD-10-CM | POA: Diagnosis not present

## 2022-08-06 DIAGNOSIS — M109 Gout, unspecified: Secondary | ICD-10-CM | POA: Diagnosis not present

## 2022-08-06 DIAGNOSIS — E1129 Type 2 diabetes mellitus with other diabetic kidney complication: Secondary | ICD-10-CM | POA: Diagnosis not present

## 2022-08-06 DIAGNOSIS — I639 Cerebral infarction, unspecified: Secondary | ICD-10-CM | POA: Diagnosis not present

## 2022-08-09 DIAGNOSIS — I639 Cerebral infarction, unspecified: Secondary | ICD-10-CM | POA: Diagnosis not present

## 2022-08-09 DIAGNOSIS — R4781 Slurred speech: Secondary | ICD-10-CM | POA: Diagnosis not present

## 2022-08-10 ENCOUNTER — Telehealth: Payer: Self-pay | Admitting: Cardiology

## 2022-08-10 NOTE — Telephone Encounter (Signed)
Patient was calling to schd his CT, but CT department said the order is put in incorrectly. Please advise

## 2022-08-10 NOTE — Telephone Encounter (Signed)
Advised pt that CT will call for appt after insurance approves it.

## 2022-08-10 NOTE — Telephone Encounter (Signed)
NO VM set up. Unable to leave message on home phone or cell phone.

## 2022-08-14 ENCOUNTER — Ambulatory Visit (HOSPITAL_BASED_OUTPATIENT_CLINIC_OR_DEPARTMENT_OTHER)
Admission: RE | Admit: 2022-08-14 | Discharge: 2022-08-14 | Disposition: A | Discharge status: Home / Self Care | Payer: PPO | Source: Ambulatory Visit | Attending: Cardiology | Admitting: Cardiology

## 2022-08-14 DIAGNOSIS — I7121 Aneurysm of the ascending aorta, without rupture: Secondary | ICD-10-CM | POA: Diagnosis not present

## 2022-08-14 DIAGNOSIS — I700 Atherosclerosis of aorta: Secondary | ICD-10-CM | POA: Diagnosis not present

## 2022-08-15 ENCOUNTER — Telehealth: Payer: Self-pay | Admitting: Cardiology

## 2022-08-15 NOTE — Telephone Encounter (Signed)
Pt reports that he has had a stroke and is scheduled to have his carotids checked at [facility]. Pt was calling for results of his CT chest. Advised that I do not have a result note at this time.

## 2022-08-15 NOTE — Telephone Encounter (Signed)
Pt is requesting call back in regards to results and medication management.

## 2022-08-17 DIAGNOSIS — I6523 Occlusion and stenosis of bilateral carotid arteries: Secondary | ICD-10-CM | POA: Diagnosis not present

## 2022-08-17 DIAGNOSIS — I639 Cerebral infarction, unspecified: Secondary | ICD-10-CM | POA: Diagnosis not present

## 2022-08-21 ENCOUNTER — Telehealth: Payer: Self-pay | Admitting: Cardiology

## 2022-08-21 NOTE — Telephone Encounter (Signed)
4 samples left for pt to pick up while completing pt assistance form.

## 2022-08-21 NOTE — Telephone Encounter (Signed)
Pt c/o medication issue:  1. Name of Medication: rivaroxaban (XARELTO) 20 MG TABS tablet   2. How are you currently taking this medication (dosage and times per day)? Take 1 tablet (20 mg total) by mouth daily. - Oral   3. Are you having a reaction (difficulty breathing--STAT)?   4. What is your medication issue? Pt called in wanting to know if Dr. Geraldo Pitter still wants him on this medication

## 2022-08-24 DIAGNOSIS — R059 Cough, unspecified: Secondary | ICD-10-CM | POA: Diagnosis not present

## 2022-08-24 DIAGNOSIS — E119 Type 2 diabetes mellitus without complications: Secondary | ICD-10-CM | POA: Diagnosis not present

## 2022-08-24 DIAGNOSIS — R070 Pain in throat: Secondary | ICD-10-CM | POA: Diagnosis not present

## 2022-08-24 DIAGNOSIS — R030 Elevated blood-pressure reading, without diagnosis of hypertension: Secondary | ICD-10-CM | POA: Diagnosis not present

## 2022-10-11 DIAGNOSIS — R051 Acute cough: Secondary | ICD-10-CM | POA: Diagnosis not present

## 2022-10-11 DIAGNOSIS — J189 Pneumonia, unspecified organism: Secondary | ICD-10-CM | POA: Diagnosis not present

## 2022-11-06 ENCOUNTER — Other Ambulatory Visit: Payer: Self-pay | Admitting: Cardiology

## 2022-11-06 DIAGNOSIS — I10 Essential (primary) hypertension: Secondary | ICD-10-CM | POA: Diagnosis not present

## 2022-11-06 DIAGNOSIS — M109 Gout, unspecified: Secondary | ICD-10-CM | POA: Diagnosis not present

## 2022-11-06 DIAGNOSIS — E785 Hyperlipidemia, unspecified: Secondary | ICD-10-CM | POA: Diagnosis not present

## 2022-11-06 DIAGNOSIS — Z79899 Other long term (current) drug therapy: Secondary | ICD-10-CM | POA: Diagnosis not present

## 2022-11-06 DIAGNOSIS — I2510 Atherosclerotic heart disease of native coronary artery without angina pectoris: Secondary | ICD-10-CM | POA: Diagnosis not present

## 2022-11-06 DIAGNOSIS — I4891 Unspecified atrial fibrillation: Secondary | ICD-10-CM | POA: Diagnosis not present

## 2022-11-06 DIAGNOSIS — E1165 Type 2 diabetes mellitus with hyperglycemia: Secondary | ICD-10-CM | POA: Diagnosis not present

## 2022-11-06 DIAGNOSIS — E1129 Type 2 diabetes mellitus with other diabetic kidney complication: Secondary | ICD-10-CM | POA: Diagnosis not present

## 2022-11-14 DIAGNOSIS — Z794 Long term (current) use of insulin: Secondary | ICD-10-CM | POA: Diagnosis not present

## 2022-11-14 DIAGNOSIS — L602 Onychogryphosis: Secondary | ICD-10-CM

## 2022-11-14 DIAGNOSIS — M2041 Other hammer toe(s) (acquired), right foot: Secondary | ICD-10-CM | POA: Insufficient documentation

## 2022-11-14 DIAGNOSIS — M2042 Other hammer toe(s) (acquired), left foot: Secondary | ICD-10-CM | POA: Diagnosis not present

## 2022-11-14 DIAGNOSIS — Z7984 Long term (current) use of oral hypoglycemic drugs: Secondary | ICD-10-CM | POA: Diagnosis not present

## 2022-11-14 DIAGNOSIS — E119 Type 2 diabetes mellitus without complications: Secondary | ICD-10-CM | POA: Diagnosis not present

## 2022-11-14 HISTORY — DX: Onychogryphosis: L60.2

## 2022-11-16 DIAGNOSIS — H903 Sensorineural hearing loss, bilateral: Secondary | ICD-10-CM | POA: Diagnosis not present

## 2022-11-28 ENCOUNTER — Telehealth: Payer: Self-pay | Admitting: Cardiology

## 2022-11-28 NOTE — Telephone Encounter (Signed)
Pt c/o BP issue: STAT if pt c/o blurred vision, one-sided weakness or slurred speech  1. What are your last 5 BP readings? 90/61  2. Are you having any other symptoms (ex. Dizziness, headache, blurred vision, passed out)? Dizziness and not able to  walk  3. What is your BP issue? Pt states that BP has been running low more frequently and PCP suggest that he be seen.

## 2022-11-28 NOTE — Telephone Encounter (Signed)
Spoke with pt about message. He reported low blood pressures and dizziness and would like an appt with Dr. Geraldo Pitter. Encouraged pt to drink plenty to stay well hydrated and I would send to front desk for appt. Pt agreed and verbalized understanding.

## 2022-11-30 ENCOUNTER — Other Ambulatory Visit: Payer: Self-pay | Admitting: Cardiology

## 2022-12-03 DIAGNOSIS — E1140 Type 2 diabetes mellitus with diabetic neuropathy, unspecified: Secondary | ICD-10-CM

## 2022-12-03 DIAGNOSIS — M722 Plantar fascial fibromatosis: Secondary | ICD-10-CM | POA: Insufficient documentation

## 2022-12-03 HISTORY — DX: Type 2 diabetes mellitus with diabetic neuropathy, unspecified: E11.40

## 2022-12-03 HISTORY — DX: Plantar fascial fibromatosis: M72.2

## 2022-12-07 ENCOUNTER — Encounter: Payer: Self-pay | Admitting: Gastroenterology

## 2022-12-07 ENCOUNTER — Other Ambulatory Visit (INDEPENDENT_AMBULATORY_CARE_PROVIDER_SITE_OTHER): Payer: PPO

## 2022-12-07 ENCOUNTER — Telehealth: Payer: Self-pay | Admitting: Gastroenterology

## 2022-12-07 ENCOUNTER — Ambulatory Visit (INDEPENDENT_AMBULATORY_CARE_PROVIDER_SITE_OTHER): Payer: PPO | Admitting: Gastroenterology

## 2022-12-07 VITALS — BP 130/78 | HR 72 | Ht 77.0 in | Wt 299.0 lb

## 2022-12-07 DIAGNOSIS — R1084 Generalized abdominal pain: Secondary | ICD-10-CM | POA: Diagnosis not present

## 2022-12-07 LAB — COMPREHENSIVE METABOLIC PANEL
ALT: 8 U/L (ref 0–53)
AST: 17 U/L (ref 0–37)
Albumin: 3.8 g/dL (ref 3.5–5.2)
Alkaline Phosphatase: 43 U/L (ref 39–117)
BUN: 15 mg/dL (ref 6–23)
CO2: 27 mEq/L (ref 19–32)
Calcium: 9 mg/dL (ref 8.4–10.5)
Chloride: 106 mEq/L (ref 96–112)
Creatinine, Ser: 1.03 mg/dL (ref 0.40–1.50)
GFR: 71.02 mL/min (ref >60.00)
Glucose, Bld: 245 mg/dL — ABNORMAL HIGH (ref 70–99)
Potassium: 4 mEq/L (ref 3.5–5.1)
Sodium: 141 mEq/L (ref 135–145)
Total Bilirubin: 0.7 mg/dL (ref 0.2–1.2)
Total Protein: 6.9 g/dL (ref 6.0–8.3)

## 2022-12-07 LAB — CBC WITH DIFFERENTIAL/PLATELET
Basophils Absolute: 0.1 10*3/uL (ref 0.0–0.1)
Basophils Relative: 1.4 % (ref 0.0–3.0)
Eosinophils Absolute: 0.2 10*3/uL (ref 0.0–0.7)
Eosinophils Relative: 3.8 % (ref 0.0–5.0)
HCT: 40.5 % (ref 39.0–52.0)
Hemoglobin: 13.7 g/dL (ref 13.0–17.0)
Lymphocytes Relative: 21.5 % (ref 12.0–46.0)
Lymphs Abs: 1.3 10*3/uL (ref 0.7–4.0)
MCHC: 33.9 g/dL (ref 30.0–36.0)
MCV: 98.7 fl (ref 78.0–100.0)
Monocytes Absolute: 0.4 10*3/uL (ref 0.1–1.0)
Monocytes Relative: 7.4 % (ref 3.0–12.0)
Neutro Abs: 3.9 10*3/uL (ref 1.4–7.7)
Neutrophils Relative %: 65.9 % (ref 43.0–77.0)
Platelets: 147 10*3/uL — ABNORMAL LOW (ref 150.0–400.0)
RBC: 4.11 Mil/uL — ABNORMAL LOW (ref 4.22–5.81)
RDW: 14.4 % (ref 11.5–15.5)
WBC: 5.9 10*3/uL (ref 4.0–10.5)

## 2022-12-07 LAB — LIPASE: Lipase: 2 U/L — ABNORMAL LOW (ref 11.0–59.0)

## 2022-12-07 MED ORDER — PANTOPRAZOLE SODIUM 40 MG PO TBEC: 40.0000 mg | DELAYED_RELEASE_TABLET | Freq: Every day | ORAL | 6 refills | Status: DC

## 2022-12-07 NOTE — Progress Notes (Signed)
Chief Complaint: Abdo pain  Referring Provider:  Nicoletta Dress, MD      ASSESSMENT AND PLAN;   #1. Post-prandial Abdo pain. Doubt GB as etiology.  History of Goody powder intake is concerning.  #2. H/O A Fib on Xarelto (EF 55-60% 11/2020), DM2, CAD s/p CABG, HLD, HTN, gout, PAD  Plan: - CBC, CMP, lipase - CTA - protonix '40mg'$  po QD #30,  - Stop Goodys - If still with problems, [place] followed by EGD (off AC)      HPI:    Ryan Vaughan is a 77 y.o. male  With H/O A Fib on Xarelto (EF 55-60% 11/2020), stable ascending thoracic aortic dilatation, DM, CAD s/p CABG, HLD, HTN, gout, PAD, asymptomatic cholelithiasis. With H/O colonic polyps in the past.  With upper abdominal pain-mostly left-sided With associated nausea but no vomiting Has been having some heartburn No regurgitation Gets worse after eating, lasting 2 to 3 hours and then goes away. Also associated abdominal bloating No jaundice dark urine or pale stools. Has been using significant Goody powders for back pain per patient's wife  Denies having any diarrhea or constipation.  No melena or hematochezia.   Past GI procedures: Colonoscopy 08/2012 ([facility]): 1 cm flat sessile polyp in proximal ascending colon s/p polypectomy, 2.5 cm rectal polyp s/p EMR, tattooing, Mod sigmoid diverticulosis.  Subsequent flexible sigmoidoscopy 11/2012 showed minimal residual polyp s/p polypectomy.  Bx- TA. He was advised to repeat in 3 yrs. He did get the letter  Colon 05/02/2021 - Two 4 to 6 mm polyps in the mid sigmoid colon and in the distal sigmoid colon, removed with a cold snare. Resected and retrieved. - Moderate predominantly sigmoid diverticulosis. - Non-bleeding internal hemorrhoids. - Bx- TA - No need to rpt unless problems.  CT chest 08/2022 1. Stable moderate dilation of the ascending thoracic aorta, unchanged dating back to 2016. Could consider the following as warranted. Recommend annual imaging followup by CTA or  MRA. This recommendation follows 2010 ACCF/AHA/AATS/ACR/ASA/SCA/SCAI/SIR/STS/SVM Guidelines for the Diagnosis and Management of Patients with Thoracic Aortic Disease. Circulation. 2010; [id number]. Aortic aneurysm NOS (ICD10-I71.9) 2. Signs of cholelithiasis, pancreatic atrophy and vascular disease in the abdomen. 3. Aortic atherosclerosis and calcified coronary artery disease post median sternotomy and clipping of the LEFT atrial appendage. 4. Signs of cholelithiasis. 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   Coronary artery disease    Diabetes mellitus due to underlying condition with unspecified complications ([place]) [id number]   Diabetes mellitus with neuropathy ([place] 12/03/2022   Diabetes mellitus without complication ([place])    Dyslipidemia 05/04/2015   Essential hypertension 04/08/2014   Gout 01/24/2018   H/O carotid endarterectomy 05/14/2017   Hammertoes of both feet 11/14/2022   Hearing loss    bilateral - no hearing aids   Hypertension    Osteoarthritis of left knee 01/24/2018   Overgrown toenails 11/14/2022   Permanent atrial fibrillation ([place]) 12/02/2020   Plantar fasciitis of left foot 12/03/2022   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 Ascending colon polyp status post polypectomy.   CORONARY ARTERY BYPASS GRAFT N/A 09/10/2019   Procedure: CORONARY ARTERY BYPASS GRAFTING (CABG) x4 , using left internal mammary artery, and right leg greater saphenous vein harvested endoscopically;  Surgeon: BGaye Pollack MD;  Location: [facility];  Service: Open Heart Surgery;  Laterality: N/A;   LEFT HEART CATH AND CORONARY ANGIOGRAPHY N/A 08/28/2019   Procedure: LEFT HEART CATH AND CORONARY ANGIOGRAPHY;  Surgeon: SBelva Crome MD;  [facility];  Service: Cardiovascular;  Laterality: N/A;   SIGMOIDOSCOPY  11/17/2012   Minimal residual polyp, status post polypectomy. Small internal hemorrhoids.   TEE WITHOUT CARDIOVERSION N/A 09/10/2019   Procedure: TRANSESOPHAGEAL ECHOCARDIOGRAM (TEE);  Surgeon: BGaye Pollack MD;  [facility]  Service: Open Heart Surgery;  Laterality: N/A;    Family History  Problem Relation Age of Onset   Hypertension Mother    Colon cancer Neg Hx    Stomach cancer Neg Hx    Liver cancer Neg Hx    Throat cancer Neg Hx     Social History   Tobacco Use   Smoking status: Never   Smokeless tobacco: Never  Vaping Use   Vaping Use: Never used  Substance Use Topics   Alcohol use: No   Drug use: No    Current Outpatient Medications  Medication Sig Dispense Refill   acetaminophen (TYLENOL) 500 MG tablet Take 1,000-1,500 mg by mouth every 8 (eight) hours as needed for moderate pain or headache.     aspirin EC 81 MG tablet Take 81 mg by mouth daily.     atorvastatin (LIPITOR) 80 MG tablet TAKE 1 TABLET BY MOUTH ONCE DAILY 90 tablet 3   carvedilol (COREG)  12.5 MG tablet Take 1 tablet (12.5 mg total) by mouth 2 (two) times daily. 180 tablet 1   gabapentin (NEURONTIN) 800 MG tablet Take 800 mg by mouth as needed for pain.     glimepiride (AMARYL) 4 MG tablet Take 4 mg by mouth at bedtime.      LANTUS SOLOSTAR 100 UNIT/ML Solostar Pen Inject 20 Units into the skin at bedtime.     lisinopril (ZESTRIL) 10 MG tablet Take 10 mg by mouth daily.     nitroGLYCERIN (NITROSTAT) 0.4 MG SL tablet Place 1 tablet (0.4 mg total) under the tongue every 5 (five) minutes as needed for chest pain. 30 tablet 4   rivaroxaban (XARELTO) 20 MG TABS tablet Take 1 tablet (20 mg total) by mouth daily. 180 tablet 3   valsartan (DIOVAN) 320 MG tablet Take 320 mg by mouth daily.     Vitamin D, Ergocalciferol, (DRISDOL) 1.25 MG (50000 UT) CAPS capsule Take 50,000 Units by mouth every Friday.      amLODipine (NORVASC) 5 MG tablet Take 5 mg by mouth daily. (Patient not taking: Reported on 12/07/2022)  No current facility-administered medications for this visit.    Allergies  Allergen Reactions   Penicillins Rash    Did it involve swelling of the face/tongue/throat, SOB, or low BP? No Did it involve sudden or severe rash/hives, skin peeling, or any reaction on the inside of your mouth or nose? No Did you need to seek medical attention at a hospital or doctor's office? No When did it last happen?      2-3 years If all above answers are "NO", may proceed with cephalosporin use.    Amiodarone Hives   Isosorbide Mononitrate Er [Isosorbide Dinitrate] Other (See Comments)    Headache and dizziness   Losartan Swelling    Review of Systems:  Constitutional: Denies fever, chills, diaphoresis, appetite change and fatigue.  HEENT: Denies photophobia, eye pain, redness, hearing loss, ear pain, congestion, sore throat, rhinorrhea, sneezing, mouth sores, neck pain, neck stiffness and tinnitus.   Respiratory: Denies SOB, DOE, cough, chest tightness,  and wheezing.   Cardiovascular:  Denies chest pain, palpitations and leg swelling.  Genitourinary: Denies dysuria, urgency, frequency, hematuria, flank pain and difficulty urinating.  Musculoskeletal: Denies myalgias, back pain, joint swelling, arthralgias and gait problem.  Skin: No rash.  Neurological: Denies dizziness, seizures, syncope, weakness, light-headedness, numbness and headaches.  Hematological: Denies adenopathy. Easy bruising, personal or family bleeding history  Psychiatric/Behavioral: No anxiety or depression     Physical Exam:    BP 130/78   Pulse 72   Ht '6\' 5"'$  (1.956 m)   Wt 299 lb (135.6 kg)   BMI 35.46 kg/m  Wt Readings from Last 3 Encounters:  12/07/22 299 lb (135.6 kg)  08/03/22 (!) 307 lb (139.3 kg)  10/25/21 (!) 303 lb 9.6 oz (137.7 kg)   Constitutional:  Well-developed, in no acute distress. Psychiatric: Normal mood and affect. Behavior is normal. HEENT: Pupils normal.  Conjunctivae are normal. No scleral icterus. Neck supple.  Cardiovascular: Normal rate, regular rhythm. No edema Pulmonary/chest: Effort normal and breath sounds normal. No wheezing, rales or rhonchi. Abdominal: Soft, nondistended. Nontender. Bowel sounds active throughout. There are no masses palpable. No hepatomegaly. Rectal: Deferred Neurological: Alert and oriented to person place and time. Skin: Skin is warm and dry. No rashes noted.  Data Reviewed: I have personally reviewed following labs and imaging studies  CBC:    Latest Ref Rng & Units 11/05/2019    8:47 AM 10/05/2019    8:46 AM 09/13/2019    2:18 AM  CBC  WBC 3.4 - 10.8 x10E3/uL 5.8  8.9  12.4   Hemoglobin 13.0 - 17.7 g/dL 12.8  11.3  10.2   Hematocrit 37.5 - 51.0 % 38.0  33.2  30.0   Platelets 150 - 450 x10E3/uL 169  243  105     CMP:    Latest Ref Rng & Units 11/05/2019    8:47 AM 10/05/2019    8:46 AM 09/13/2019    2:18 AM  CMP  Glucose 65 - 99 mg/dL 106  177  145   BUN 8 - 27 mg/dL '12  17  13   '$ Creatinine 0.76 - 1.27 mg/dL 0.90  0.79  0.90    Sodium 134 - 144 mmol/L 140  139  136   Potassium 3.5 - 5.2 mmol/L 4.7  5.0  4.4   Chloride 96 - 106 mmol/L 107  103  101   CO2 20 - 29 mmol/L '20  24  27   '$ Calcium 8.6 - 10.2 mg/dL 8.8  9.4  8.1  Total Protein 6.0 - 8.5 g/dL 7.0  7.0    Total Bilirubin 0.0 - 1.2 mg/dL 0.5  0.3    Alkaline Phos 39 - 117 IU/L 66  72    AST 0 - 40 IU/L 37  9    ALT 0 - 44 IU/L 61  7         Carmell Austria, MD 12/07/2022, 10:55 AM  Cc: Nicoletta Dress, MD

## 2022-12-07 NOTE — Telephone Encounter (Signed)
Inbound call from patient stating he left his paperwork from today's visit .   Patient states he also left his instructions.  Please advise.

## 2022-12-07 NOTE — Telephone Encounter (Signed)
Patient said he found his paperwork. He called to the lab and the lab told him he took his paperwork so they started looking and found it and they have the instructions as well

## 2022-12-07 NOTE — Patient Instructions (Addendum)
_______________________________________________________  If your blood pressure at your visit was 140/90 or greater, please contact your primary care physician to follow up on this.  _______________________________________________________  If you are age 76 or older, your body mass index should be between 23-30. Your Body mass index is 35.46 kg/m. If this is out of the aforementioned range listed, please consider follow up with your Primary Care Provider.  If you are age 65 or younger, your body mass index should be between 19-25. Your Body mass index is 35.46 kg/m. If this is out of the aformentioned range listed, please consider follow up with your Primary Care Provider.   ________________________________________________________  The [facility] providers would like to encourage you to use [facility] to communicate with providers for non-urgent requests or questions.  Due to long hold times on the telephone, sending your provider a message by [facility] may be a faster and more efficient way to get a response.  Please allow 48 business hours for a response.  Please remember that this is for non-urgent requests.  _______________________________________________________  Your provider has requested that you go to the basement level for lab work before leaving today. Press "B" on the elevator. The lab is located at the first door on the left as you exit the elevator.  We have sent the following medications to your pharmacy for you to pick up at your convenience: Protonix  Stop goody powder  You have been scheduled for a CT Angio at [facility] on the first floor of the hospital on 12-25-2022  at [place] at 930am Only water 4 hours prior to appointment. No food 4 hours prior. If you have any questions please call [phone].   Thank you,  Dr. Jackquline Denmark

## 2022-12-19 ENCOUNTER — Encounter: Payer: Self-pay | Admitting: Cardiology

## 2022-12-19 ENCOUNTER — Ambulatory Visit: Payer: PPO | Attending: Cardiology | Admitting: Cardiology

## 2022-12-19 VITALS — BP 142/88 | HR 62 | Ht 75.0 in | Wt 291.0 lb

## 2022-12-19 DIAGNOSIS — E088 Diabetes mellitus due to underlying condition with unspecified complications: Secondary | ICD-10-CM

## 2022-12-19 DIAGNOSIS — I2510 Atherosclerotic heart disease of native coronary artery without angina pectoris: Secondary | ICD-10-CM

## 2022-12-19 DIAGNOSIS — I10 Essential (primary) hypertension: Secondary | ICD-10-CM

## 2022-12-19 DIAGNOSIS — Z951 Presence of aortocoronary bypass graft: Secondary | ICD-10-CM | POA: Diagnosis not present

## 2022-12-19 DIAGNOSIS — Z9889 Other specified postprocedural states: Secondary | ICD-10-CM

## 2022-12-19 DIAGNOSIS — E785 Hyperlipidemia, unspecified: Secondary | ICD-10-CM

## 2022-12-19 DIAGNOSIS — I4891 Unspecified atrial fibrillation: Secondary | ICD-10-CM

## 2022-12-19 DIAGNOSIS — I7121 Aneurysm of the ascending aorta, without rupture: Secondary | ICD-10-CM

## 2022-12-19 NOTE — Patient Instructions (Addendum)
Call [phone] to review medication list  Please keep a BP log for 1 week and send by MyChart or mail.  Blood Pressure Record Sheet To take your blood pressure, you will need a blood pressure machine. You can buy a blood pressure machine (blood pressure monitor) at your clinic, drug store, or online. When choosing one, consider: An automatic monitor that has an arm cuff. A cuff that wraps snugly around your upper arm. You should be able to fit only one finger between your arm and the cuff. A device that stores blood pressure reading results. Do not choose a monitor that measures your blood pressure from your wrist or finger. Follow your health care provider's instructions for how to take your blood pressure. To use this form: Get one reading in the morning (a.m.) 1-2 hours after you take any medicines. Get one reading in the evening (p.m.) before supper.   Blood pressure log Date: _______________________  a.m. _____________________(1st reading) HR___________            p.m. _____________________(2nd reading) HR__________  Date: _______________________  a.m. _____________________(1st reading) HR___________            p.m. _____________________(2nd reading) HR__________  Date: _______________________  a.m. _____________________(1st reading) HR___________            p.m. _____________________(2nd reading) HR__________  Date: _______________________  a.m. _____________________(1st reading) HR___________            p.m. _____________________(2nd reading) HR__________  Date: _______________________  a.m. _____________________(1st reading) HR___________            p.m. _____________________(2nd reading) HR__________  Date: _______________________  a.m. _____________________(1st reading) HR___________            p.m. _____________________(2nd reading) HR__________  Date: _______________________  a.m. _____________________(1st reading) HR___________            p.m.  _____________________(2nd reading) HR__________   This information is not intended to replace advice given to you by your health care provider. Make sure you discuss any questions you have with your health care provider. Document Revised: 01/06/2020 Document Reviewed: 01/06/2020 Elsevier Patient Education  [place].   Medication Instructions:  Your physician recommends that you continue on your current medications as directed. Please refer to the Current Medication list given to you today.  *If you need a refill on your cardiac medications before your next appointment, please call your pharmacy*   Lab Work: None ordered If you have labs (blood work) drawn today and your tests are completely normal, you will receive your results only by: [place] (if you have MyChart) OR A paper copy in the mail If you have any lab test that is abnormal or we need to change your treatment, we will call you to review the results.   Testing/Procedures: None ordered   Follow-Up: At [facility], you and your health needs are our priority.  As part of our continuing mission to provide you with exceptional heart care, we have created designated Provider Care Teams.  These Care Teams include your primary Cardiologist (physician) and Advanced Practice Providers (APPs -  Physician Assistants and Nurse Practitioners) who all work together to provide you with the care you need, when you need it.  We recommend signing up for the patient portal called "MyChart".  Sign up information is provided on this After Visit Summary.  MyChart is used to connect with patients for Virtual Visits (Telemedicine).  Patients are able to view lab/test results, encounter notes, upcoming appointments, etc.  Non-urgent messages can be sent to your provider as well.   To learn more about what you can do with MyChart, go to [email or web address].    Your next appointment:   1 month(s)  The format for your  next appointment:   In Person  Provider:   Jyl Heinz, MD    Other Instructions none  Important Information About Sugar

## 2022-12-19 NOTE — Progress Notes (Signed)
Cardiology Office Note:    Date:  12/19/2022   ID:  Ryan Vaughan, DOB 1946-02-18, MRN [id number]  PCP:  Ryan Dress, MD  Cardiologist:  Ryan Lindau, MD   Referring MD: Ryan Dress, MD    ASSESSMENT:    1. Aneurysm of ascending aorta without rupture ([place])   2. Atrial fibrillation, unspecified type ([place])   3. Coronary artery disease involving native coronary artery of native heart without angina pectoris   4. Essential hypertension   5. Diabetes mellitus due to underlying condition with unspecified complications ([place])   6. Dyslipidemia   7. S/P CABG x 4   8. H/O carotid endarterectomy    PLAN:    In order of problems listed above:  Coronary artery disease: Secondary prevention stressed with the patient.  Importance of compliance with diet and medication stressed and he vocalized understanding.  He was advised to ambulate to the best of his ability. Atrial fibrillation:I discussed with the patient atrial fibrillation, disease process. Management and therapy including rate and rhythm control, anticoagulation benefits and potential risks were discussed extensively with the patient. Patient had multiple questions which were answered to patient's satisfaction. Ascending aortic aneurysm: He is due for a follow-up CT scan and we will do this for him. Essential hypertension: He is not clear about his medications.  We will give him a call when he goes home today and get a list of his medications.  He will check his blood pressures 3 times a day and send it to [place] in a week.  I mentioned to the patient that his pain his blood pressures are low to take Gatorade 0 and keep himself adequate to be hydrated.  He will be seen in follow-up appointment in a month or earlier if he has any concerns.  Fall precautions were advised. Diabetes mellitus, mixed dyslipidemia and obesity: Weight reduction stressed diet emphasized and he promises to do better.  He was also advised to bring his  blood pressure machine at the next appointment. Patient will be seen in follow-up appointment in 6 months or earlier if the patient has any concerns.    Medication Adjustments/Labs and Tests Ordered: Current medicines are reviewed at length with the patient today.  Concerns regarding medicines are outlined above.  No orders of the defined types were placed in this encounter.  No orders of the defined types were placed in this encounter.    No chief complaint on file.    History of Present Illness:    Ryan Vaughan is a 77 y.o. male.  Patient has past medical history of coronary artery disease, aneurysm of ascending aorta, atrial fibrillation, mixed dyslipidemia and diabetes mellitus and obesity.  He mentions to me that he is having blood pressure swings which is why he is referred here.  He denies any chest pain orthopnea PND.  His symptoms of feeling weak.  No history of syncope.  At the time of my evaluation, the patient is alert awake oriented and in no distress.  He is not clear about his medications.  Past Medical History:  Diagnosis Date   Aneurysm of ascending aorta ([place]) 01/05/2021   Atrial fibrillation (HCC)    CAD (coronary artery disease) 11/05/2019   Carotid artery disease (HCC)    Carotid artery stenosis 05/14/2017   Chronic anticoagulation 04/08/2014   Chronic atrial fibrillation ([place]) 05/04/2015   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   Coronary artery disease    Diabetes mellitus due  to underlying condition with unspecified complications ([place]) [id number]   Diabetes mellitus with neuropathy ([place]) 12/03/2022   Diabetes mellitus without complication ([place])    Dyslipidemia 05/04/2015   Essential hypertension 04/08/2014   Gout 01/24/2018   H/O carotid endarterectomy 05/14/2017   Hammertoes of both feet 11/14/2022   Hearing loss    bilateral - no hearing aids   Hypertension    Osteoarthritis of left knee 01/24/2018   Overgrown toenails 11/14/2022    Permanent atrial fibrillation ([place]) 12/02/2020   Plantar fasciitis of left foot 12/03/2022   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 Normajean Baxter): no flow identified in right renal artery origin or in known right renal artery aneurysm, patent artery in the region of mid renal artery could represent a collateral vessel, stable > 60% left RA stenosis   S/P CABG x 4 09/10/2019   TIA (transient ischemic attack)    due to severe RICA stenosis, s/p right CEA 04/09/14    Past Surgical History:  Procedure Laterality Date   CAROTID ENDARTERECTOMY Right 04/09/2014   CLIPPING OF ATRIAL APPENDAGE N/A 09/10/2019   Procedure: CLIPPING OF ATRIAL APPENDAGE Using AtriCure PRO2 Clip Size 10mm;  Surgeon: Gaye Pollack, MD;  Location: [facility];  Service: Open Heart Surgery;  Laterality: N/A;   COLONOSCOPY  08/06/2012   Large rectal polpy status post piecemeal polypectomy. Ascending colon polyp status post polypectomy.   CORONARY ARTERY BYPASS GRAFT N/A 09/10/2019   Procedure: CORONARY ARTERY BYPASS GRAFTING (CABG) x4 , using left internal mammary artery, and right leg greater saphenous vein harvested endoscopically;  Surgeon: Gaye Pollack, MD;  Location: [facility];  Service: Open Heart Surgery;  Laterality: N/A;   LEFT HEART CATH AND CORONARY ANGIOGRAPHY N/A 08/28/2019   Procedure: LEFT HEART CATH AND CORONARY ANGIOGRAPHY;  Surgeon: Belva Crome, MD;  Location: [facility];  Service: Cardiovascular;  Laterality: N/A;   SIGMOIDOSCOPY  11/17/2012   Minimal residual polyp, status post polypectomy. Small internal hemorrhoids.   TEE WITHOUT CARDIOVERSION N/A 09/10/2019   Procedure: TRANSESOPHAGEAL ECHOCARDIOGRAM (TEE);  Surgeon: Gaye Pollack, MD;  Location: [facility];  Service: Open Heart Surgery;  Laterality: N/A;    Current Medications: Current Meds  Medication Sig   acetaminophen (TYLENOL) 500 MG tablet Take 1,000-1,500 mg by mouth every 8 (eight) hours as needed for moderate pain  or headache.   aspirin EC 81 MG tablet Take 81 mg by mouth daily.   atorvastatin (LIPITOR) 80 MG tablet TAKE 1 TABLET BY MOUTH ONCE DAILY   carvedilol (COREG) 12.5 MG tablet Take 1 tablet (12.5 mg total) by mouth 2 (two) times daily.   gabapentin (NEURONTIN) 800 MG tablet Take 800 mg by mouth as needed for pain.   glimepiride (AMARYL) 4 MG tablet Take 4 mg by mouth at bedtime.    LANTUS SOLOSTAR 100 UNIT/ML Solostar Pen Inject 20 Units into the skin at bedtime.   lisinopril (ZESTRIL) 10 MG tablet Take 10 mg by mouth daily.   nitroGLYCERIN (NITROSTAT) 0.4 MG SL tablet Place 1 tablet (0.4 mg total) under the tongue every 5 (five) minutes as needed for chest pain.   rivaroxaban (XARELTO) 20 MG TABS tablet Take 1 tablet (20 mg total) by mouth daily.   valsartan (DIOVAN) 320 MG tablet Take 320 mg by mouth daily.   Vitamin D, Ergocalciferol, (DRISDOL) 1.25 MG (50000 UT) CAPS capsule Take 50,000 Units by mouth every Friday.      Allergies:  Penicillins, Amiodarone, Isosorbide mononitrate er [isosorbide dinitrate], and Losartan   Social History   Socioeconomic History   Marital status: Married    Spouse name: Not on file   Number of children: 2   Years of education: Not on file   Highest education level: Not on file  Occupational History   Occupation: Retired  Tobacco Use   Smoking status: Never   Smokeless tobacco: Never  Vaping Use   Vaping Use: Never used  Substance and Sexual Activity   Alcohol use: No   Drug use: No   Sexual activity: Not on file  Other Topics Concern   Not on file  Social History Narrative   Not on file   Social Determinants of Health   Financial Resource Strain: Not on file  Food Insecurity: Not on file  Transportation Needs: Not on file  Physical Activity: Not on file  Stress: Not on file  Social Connections: Not on file     Family History: The patient's family history includes Hypertension in his mother. There is no history of Colon cancer,  Stomach cancer, Liver cancer, or Throat cancer.  ROS:   Please see the history of present illness.    All other systems reviewed and are negative.  EKGs/Labs/Other Studies Reviewed:    The following studies were reviewed today: I discussed my findings with the patient at length.   Recent Labs: 12/07/2022: ALT 8; BUN 15; Creatinine, Ser 1.03; Hemoglobin 13.7; Platelets 147.0; Potassium 4.0; Sodium 141  Recent Lipid Panel    Component Value Date/Time   CHOL 131 11/05/2019 0847   TRIG 52 11/05/2019 0847   HDL 46 11/05/2019 0847   CHOLHDL 2.8 11/05/2019 0847   LDLCALC 73 11/05/2019 0847    Physical Exam:    VS:  BP (!) 142/88   Pulse 62   Ht 6\' 3"  (1.905 m)   Wt 291 lb (132 kg)   SpO2 96%   BMI 36.37 kg/m     Wt Readings from Last 3 Encounters:  12/19/22 291 lb (132 kg)  12/07/22 299 lb (135.6 kg)  08/03/22 (!) 307 lb (139.3 kg)     GEN: Patient is in no acute distress HEENT: Normal NECK: No JVD; No carotid bruits LYMPHATICS: No lymphadenopathy CARDIAC: Hear sounds regular, 2/6 systolic murmur at the apex. RESPIRATORY:  Clear to auscultation without rales, wheezing or rhonchi  ABDOMEN: Soft, non-tender, non-distended MUSCULOSKELETAL:  No edema; No deformity  SKIN: Warm and dry NEUROLOGIC:  Alert and oriented x 3 PSYCHIATRIC:  Normal affect   Signed, Ryan Lindau, MD  12/19/2022 10:45 AM    [place]

## 2022-12-24 DIAGNOSIS — I6523 Occlusion and stenosis of bilateral carotid arteries: Secondary | ICD-10-CM | POA: Diagnosis not present

## 2022-12-24 DIAGNOSIS — R413 Other amnesia: Secondary | ICD-10-CM | POA: Diagnosis not present

## 2022-12-24 DIAGNOSIS — H6122 Impacted cerumen, left ear: Secondary | ICD-10-CM | POA: Diagnosis not present

## 2022-12-24 DIAGNOSIS — Z8673 Personal history of transient ischemic attack (TIA), and cerebral infarction without residual deficits: Secondary | ICD-10-CM | POA: Diagnosis not present

## 2022-12-24 DIAGNOSIS — I10 Essential (primary) hypertension: Secondary | ICD-10-CM | POA: Diagnosis not present

## 2022-12-24 DIAGNOSIS — I4891 Unspecified atrial fibrillation: Secondary | ICD-10-CM | POA: Diagnosis not present

## 2022-12-25 ENCOUNTER — Telehealth: Payer: Self-pay | Admitting: Cardiology

## 2022-12-25 ENCOUNTER — Ambulatory Visit (HOSPITAL_COMMUNITY)
Admission: RE | Admit: 2022-12-25 | Discharge: 2022-12-25 | Disposition: A | Discharge status: Home / Self Care | Payer: PPO | Source: Ambulatory Visit | Attending: Gastroenterology | Admitting: Gastroenterology

## 2022-12-25 DIAGNOSIS — R1084 Generalized abdominal pain: Secondary | ICD-10-CM | POA: Diagnosis not present

## 2022-12-25 DIAGNOSIS — I7143 Infrarenal abdominal aortic aneurysm, without rupture: Secondary | ICD-10-CM | POA: Diagnosis not present

## 2022-12-25 MED ORDER — RIVAROXABAN 15 MG PO TABS: 15.0000 mg | ORAL_TABLET | Freq: Every day | ORAL | 0 refills | Status: DC

## 2022-12-25 MED ORDER — SODIUM CHLORIDE (PF) 0.9 % IJ SOLN
INTRAMUSCULAR | Status: AC
  Filled 2022-12-25: qty 50

## 2022-12-25 MED ORDER — RIVAROXABAN 2.5 MG PO TABS: 5.0000 mg | ORAL_TABLET | Freq: Every day | ORAL | 0 refills | Status: DC

## 2022-12-25 MED ORDER — IOHEXOL 350 MG/ML SOLN
100.0000 mL | Freq: Once | INTRAVENOUS | Status: AC | PRN
  Administered 2022-12-25: 100 mL via INTRAVENOUS

## 2022-12-25 NOTE — Telephone Encounter (Signed)
Pt c/o medication issue:  1. Name of Medication:  rivaroxaban (XARELTO) 20 MG TABS tablet  2. How are you currently taking this medication (dosage and times per day)?   3. Are you having a reaction (difficulty breathing--STAT)?   4. What is your medication issue?  Patient's daughter would like to confirm whether or not the patient should be taking Xarelto.

## 2022-12-25 NOTE — Telephone Encounter (Signed)
Advised that pt is to continue his Xarelto. Levada Dy verbalized understanding and had no additional questions.

## 2022-12-25 NOTE — Addendum Note (Signed)
Addended by: Truddie Hidden on: 12/25/2022 01:26 PM   Modules accepted: Orders

## 2022-12-26 ENCOUNTER — Encounter: Payer: Self-pay | Admitting: Cardiology

## 2022-12-27 ENCOUNTER — Encounter: Payer: Self-pay | Admitting: Cardiology

## 2022-12-28 ENCOUNTER — Other Ambulatory Visit: Payer: Self-pay

## 2022-12-31 ENCOUNTER — Encounter: Payer: Self-pay | Admitting: Cardiology

## 2023-01-01 ENCOUNTER — Telehealth: Payer: Self-pay

## 2023-01-01 ENCOUNTER — Encounter: Payer: Self-pay | Admitting: Gastroenterology

## 2023-01-01 ENCOUNTER — Other Ambulatory Visit: Payer: Self-pay

## 2023-01-01 ENCOUNTER — Encounter: Payer: Self-pay | Admitting: Cardiology

## 2023-01-01 DIAGNOSIS — K551 Chronic vascular disorders of intestine: Secondary | ICD-10-CM

## 2023-01-01 DIAGNOSIS — R1084 Generalized abdominal pain: Secondary | ICD-10-CM

## 2023-01-01 DIAGNOSIS — Z8679 Personal history of other diseases of the circulatory system: Secondary | ICD-10-CM

## 2023-01-01 NOTE — Telephone Encounter (Signed)
[facility] Pre-operative Risk Assessment     Request for surgical clearance:     Endoscopy Procedure  What type of surgery is being performed?     Upper Endoscopy  When is this surgery scheduled?     TBD  What type of clearance is required ?   Cardiac/Pharmacy  Are there any medications that need to be held prior to surgery and how long? Xarelto 2 days  Practice name and name of physician performing surgery?      [facility]/ Dr. Lyndel Safe  What is your office phone and fax number?      Phone- [phone]  [phone]  Anesthesia type (None, local, MAC, general) ?       MAC

## 2023-01-01 NOTE — Telephone Encounter (Signed)
Dr. Geraldo Pitter,  Mr. Finner is scheduled to undergo an endoscopy procedure and was recently seen in clinic by you.  Based on you recent visit would he  be at acceptable risk to proceed with his scheduled procedure at this time.  We also had a request to hold Xarelto however this is missing from his medication list.  Can you offer clarification on if patient is still taking Xarelto.  Please send your to [facility].   Thank you,  Ambrose Pancoast, NP

## 2023-01-02 ENCOUNTER — Other Ambulatory Visit: Payer: Self-pay

## 2023-01-02 NOTE — Telephone Encounter (Signed)
Pt daughter Levada Dy had questions about recent CT scan. All questioned answered.  Levada Dy verbalized understanding with all questions answered.

## 2023-01-03 ENCOUNTER — Telehealth: Payer: Self-pay | Admitting: Cardiology

## 2023-01-03 NOTE — Telephone Encounter (Signed)
Ryan Vaughan called about a patient assistance form that was submitted for this patient for Xarelto. She stated they are missing information on the prescription page.  They need the number of refills and the tax ID number.

## 2023-01-03 NOTE — Addendum Note (Signed)
Addended by: Truddie Hidden on: 01/03/2023 07:57 AM   Modules accepted: Orders

## 2023-01-04 NOTE — Telephone Encounter (Signed)
Received fax from [place] requesting number of refills be updated and a copy of patient's insurance cards be sent in.  I have faxed the requested items back to [place].

## 2023-01-08 ENCOUNTER — Telehealth: Payer: Self-pay

## 2023-01-08 NOTE — Patient Outreach (Signed)
  Care Coordination   Initial Visit Note   01/08/2023 Name: Ryan Vaughan MRN: [id number] DOB: April 02, 1946  Ryan Vaughan is a 77 y.o. year old male who sees Paulina Fusi, MD for primary care. I spoke with  Charisse Klinefelter by phone today.  What matters to the patients health and wellness today?  Placed call to patient to review and offer [facility] care coordination program.  Patient had me on speaker phone with wife.  Patient and wife reports they are trying to get help with patients cost of insulin. Reports they returned paperwork to PCP office yesterday.   Reports that their daughter is helping them.  I provided my name and contact information if patient needs my assistance they will call me.      SDOH assessments and interventions completed:  No     Care Coordination Interventions:  No, not indicated   Follow up plan: No further intervention required.   Encounter Outcome:  Pt. Refused   Rowe Pavy, RN, BSN, CEN [facility] [phone]

## 2023-01-10 ENCOUNTER — Ambulatory Visit (HOSPITAL_COMMUNITY)
Admission: RE | Admit: 2023-01-10 | Discharge: 2023-01-10 | Disposition: A | Discharge status: Home / Self Care | Payer: PPO | Source: Ambulatory Visit | Attending: Gastroenterology | Admitting: Gastroenterology

## 2023-01-10 ENCOUNTER — Ambulatory Visit (HOSPITAL_BASED_OUTPATIENT_CLINIC_OR_DEPARTMENT_OTHER)
Admission: RE | Admit: 2023-01-10 | Discharge: 2023-01-10 | Disposition: A | Discharge status: Home / Self Care | Payer: PPO | Source: Ambulatory Visit | Attending: Gastroenterology | Admitting: Gastroenterology

## 2023-01-10 DIAGNOSIS — R1084 Generalized abdominal pain: Secondary | ICD-10-CM | POA: Insufficient documentation

## 2023-01-10 DIAGNOSIS — R109 Unspecified abdominal pain: Secondary | ICD-10-CM | POA: Diagnosis not present

## 2023-01-10 DIAGNOSIS — K551 Chronic vascular disorders of intestine: Secondary | ICD-10-CM | POA: Insufficient documentation

## 2023-01-14 ENCOUNTER — Encounter: Payer: Self-pay | Admitting: Gastroenterology

## 2023-01-15 ENCOUNTER — Telehealth: Payer: Self-pay | Admitting: *Deleted

## 2023-01-15 DIAGNOSIS — I10 Essential (primary) hypertension: Secondary | ICD-10-CM | POA: Diagnosis not present

## 2023-01-15 DIAGNOSIS — D6869 Other thrombophilia: Secondary | ICD-10-CM | POA: Diagnosis not present

## 2023-01-15 DIAGNOSIS — I480 Paroxysmal atrial fibrillation: Secondary | ICD-10-CM | POA: Diagnosis not present

## 2023-01-15 DIAGNOSIS — Z7901 Long term (current) use of anticoagulants: Secondary | ICD-10-CM | POA: Diagnosis not present

## 2023-01-15 DIAGNOSIS — R413 Other amnesia: Secondary | ICD-10-CM | POA: Diagnosis not present

## 2023-01-15 DIAGNOSIS — Z7984 Long term (current) use of oral hypoglycemic drugs: Secondary | ICD-10-CM | POA: Diagnosis not present

## 2023-01-15 DIAGNOSIS — I722 Aneurysm of renal artery: Secondary | ICD-10-CM | POA: Diagnosis not present

## 2023-01-15 DIAGNOSIS — E119 Type 2 diabetes mellitus without complications: Secondary | ICD-10-CM | POA: Diagnosis not present

## 2023-01-15 NOTE — Telephone Encounter (Signed)
Patient given detailed instructions per Myocardial Perfusion Study Information Sheet for the test on 01/22/23  Patient notified to arrive 15 minutes early and that it is imperative to arrive on time for appointment to keep from having the test rescheduled.  If you need to cancel or reschedule your appointment, please call the office within 24 hours of your appointment. . Patient verbalized understanding.Faatimah Spielberg Jacqueline, RN   

## 2023-01-15 NOTE — Telephone Encounter (Signed)
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

## 2023-01-15 NOTE — Progress Notes (Signed)
Needs vascular surgeon -  ASAP Hold off on EGD Having postprandial mesenteric ischemia with wt loss Send report to family physician

## 2023-01-16 ENCOUNTER — Encounter: Payer: Self-pay | Admitting: Cardiology

## 2023-01-16 ENCOUNTER — Other Ambulatory Visit: Payer: Self-pay

## 2023-01-16 DIAGNOSIS — K551 Chronic vascular disorders of intestine: Secondary | ICD-10-CM

## 2023-01-16 DIAGNOSIS — I714 Abdominal aortic aneurysm, without rupture, unspecified: Secondary | ICD-10-CM

## 2023-01-21 ENCOUNTER — Ambulatory Visit: Payer: PPO | Attending: Cardiology | Admitting: Cardiology

## 2023-01-21 ENCOUNTER — Encounter: Payer: Self-pay | Admitting: Cardiology

## 2023-01-21 VITALS — BP 146/88 | HR 70 | Ht 76.0 in | Wt 282.2 lb

## 2023-01-21 DIAGNOSIS — Z9889 Other specified postprocedural states: Secondary | ICD-10-CM

## 2023-01-21 DIAGNOSIS — I4821 Permanent atrial fibrillation: Secondary | ICD-10-CM | POA: Diagnosis not present

## 2023-01-21 DIAGNOSIS — I2510 Atherosclerotic heart disease of native coronary artery without angina pectoris: Secondary | ICD-10-CM | POA: Diagnosis not present

## 2023-01-21 DIAGNOSIS — Z951 Presence of aortocoronary bypass graft: Secondary | ICD-10-CM | POA: Diagnosis not present

## 2023-01-21 DIAGNOSIS — I10 Essential (primary) hypertension: Secondary | ICD-10-CM

## 2023-01-21 DIAGNOSIS — E088 Diabetes mellitus due to underlying condition with unspecified complications: Secondary | ICD-10-CM | POA: Diagnosis not present

## 2023-01-21 NOTE — Progress Notes (Signed)
Cardiology Office Note:    Date:  01/21/2023   ID:  Ryan Vaughan, DOB 11-Nov-1945, MRN [id number]  PCP:  Paulina Fusi, MD  Cardiologist:  Garwin Brothers, MD   Referring MD: Paulina Fusi, MD    ASSESSMENT:    1. 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 Essential hypertension   3. Permanent atrial fibrillation   4. Diabetes mellitus due to underlying condition with unspecified complications   5. H/O carotid endarterectomy   6. S/P CABG x 4    PLAN:    In order of problems listed above:  Permanent atrial fibrillation:I discussed with the patient atrial fibrillation, disease process. Management and therapy including rate and rhythm control, anticoagulation benefits and potential risks were discussed extensively with the patient. Patient had multiple questions which were answered to patient's satisfaction. Coronary artery disease: Stable at this time.  Secondary prevention stressed.  Importance of compliance with diet medication stressed any vocalized understanding.  He was advised to walk to the best of his ability. Mixed dyslipidemia: On lipid-lowering medications followed by primary care.  Lipids reviewed and they are markedly elevated diet emphasized he promises to do better.  He mentions to me that he will get blood work done in the next few weeks and send it to me. Diabetes mellitus: Managed by primary care.  Diet emphasized. Vascular disease peripheral vascular: He has an appointment with the vascular surgeon tomorrow and he will keep me posted about this.  I reviewed reports of the CT scan. Patient will be seen in follow-up appointment in 6 months or earlier if the patient has any concerns.    Medication Adjustments/Labs and Tests Ordered: Current medicines are reviewed at length with the patient today.  Concerns regarding medicines are outlined above.  No orders of the defined types were placed in this  encounter.  No orders of the defined types were placed in this encounter.    No chief complaint on file.    History of Present Illness:    Ryan Vaughan is a 77 y.o. male.  Patient has past medical history of coronary artery disease post CABG surgery, essential hypertension, dyslipidemia, diabetes mellitus and obesity.  Overall he leads a sedentary lifestyle.  He has permanent atrial fibrillation.  He denies any chest pain orthopnea or PND.  At the time of my evaluation, the patient is alert awake oriented and in no distress.  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   Atrial fibrillation    CAD (coronary artery disease) 11/05/2019   Carotid artery disease    Carotid artery stenosis 05/14/2017   Chronic anticoagulation 04/08/2014   Chronic atrial fibrillation 05/04/2015   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   Coronary artery disease    Diabetes mellitus due to underlying condition with unspecified complications 08/25/2018   Diabetes mellitus with neuropathy 12/03/2022   Diabetes mellitus without complication    Dyslipidemia 05/04/2015   Essential hypertension 04/08/2014   Gout 01/24/2018   H/O carotid endarterectomy 05/14/2017   Hammertoes of both feet 11/14/2022   Hearing loss    bilateral - no hearing aids   Hypertension    Osteoarthritis of left knee 01/24/2018   Overgrown toenails 11/14/2022   Permanent atrial fibrillation 12/02/2020   Plantar fasciitis of left foot 12/03/2022   Renal artery aneurysm    05/02/17 Renal artery [place] ([facility], Dr. Alvy Beal): no flow identified in  right renal artery origin or in known right renal artery aneurysm, patent artery in the region of mid renal artery could represent a collateral vessel, stable > 60% left RA stenosis   S/P CABG x 4 09/10/2019   TIA (transient ischemic attack)    due to severe RICA stenosis, s/p right CEA 04/09/14    Past Surgical History:  Procedure Laterality Date    CAROTID ENDARTERECTOMY Right 04/09/2014   CLIPPING OF ATRIAL APPENDAGE N/A 09/10/2019   Procedure: CLIPPING OF ATRIAL APPENDAGE Using AtriCure PRO2 Clip Size 45mm;  Surgeon: Alleen Borne, MD;  Location: [facility] OR;  Service: Open Heart Surgery;  Laterality: N/A;   COLONOSCOPY  08/06/2012   Large rectal polpy status post piecemeal polypectomy. Ascending colon polyp status post polypectomy.   CORONARY ARTERY BYPASS GRAFT N/A 09/10/2019   Procedure: CORONARY ARTERY BYPASS GRAFTING (CABG) x4 , using left internal mammary artery, and right leg greater saphenous vein harvested endoscopically;  Surgeon: Alleen Borne, MD;  Location: [facility];  Service: Open Heart Surgery;  Laterality: N/A;   LEFT HEART CATH AND CORONARY ANGIOGRAPHY N/A 08/28/2019   Procedure: LEFT HEART CATH AND CORONARY ANGIOGRAPHY;  Surgeon: Lyn Records, MD;  Location: [facility];  Service: Cardiovascular;  Laterality: N/A;   SIGMOIDOSCOPY  11/17/2012   Minimal residual polyp, status post polypectomy. Small internal hemorrhoids.   TEE WITHOUT CARDIOVERSION N/A 09/10/2019   Procedure: TRANSESOPHAGEAL ECHOCARDIOGRAM (TEE);  Surgeon: Alleen Borne, MD;  Location: [facility];  Service: Open Heart Surgery;  Laterality: N/A;    Current Medications: Current Meds  Medication Sig   acetaminophen (TYLENOL) 500 MG tablet Take 500-1,500 mg by mouth every 8 (eight) hours as needed for moderate pain or headache.   amLODipine (NORVASC) 5 MG tablet Take 5 mg by mouth daily.   aspirin EC 81 MG tablet Take 81 mg by mouth daily.   atorvastatin (LIPITOR) 80 MG tablet TAKE 1 TABLET BY MOUTH ONCE DAILY   carvedilol (COREG) 12.5 MG tablet Take 1 tablet (12.5 mg total) by mouth 2 (two) times daily.   donepezil (ARICEPT) 5 MG tablet Take 5 mg by mouth at bedtime.   gabapentin (NEURONTIN) 800 MG tablet Take 800 mg by mouth as needed for pain.   glimepiride (AMARYL) 4 MG tablet Take 4 mg by mouth 2 (two) times daily.   LANTUS SOLOSTAR 100 UNIT/ML  Solostar Pen Inject 20 Units into the skin at bedtime.   lisinopril (ZESTRIL) 20 MG tablet Take 20 mg by mouth daily.   nitroGLYCERIN (NITROSTAT) 0.4 MG SL tablet Place 1 tablet (0.4 mg total) under the tongue every 5 (five) minutes as needed for chest pain.   pantoprazole (PROTONIX) 40 MG tablet Take 1 tablet (40 mg total) by mouth daily.   rivaroxaban (XARELTO) 20 MG TABS tablet Take 20 mg by mouth daily with supper.   Vitamin D, Ergocalciferol, (DRISDOL) 1.25 MG (50000 UT) CAPS capsule Take 50,000 Units by mouth every Friday.      Allergies:   Penicillins, Amiodarone, Isosorbide mononitrate er [isosorbide dinitrate], and Losartan   Social History   Socioeconomic History   Marital status: Married    Spouse name: Not on file   Number of children: 2   Years of education: Not on file   Highest education level: Not on file  Occupational History   Occupation: Retired  Tobacco Use   Smoking status: Never   Smokeless tobacco: Never  Vaping Use   Vaping Use: Never used  Substance and Sexual Activity   Alcohol use: No   Drug use: No   Sexual activity: Not on file  Other Topics Concern   Not on file  Social History Narrative   Not on file   Social Determinants of Health   Financial Resource Strain: Not on file  Food Insecurity: Not on file  Transportation Needs: Not on file  Physical Activity: Not on file  Stress: Not on file  Social Connections: Not on file     Family History: The patient's family history includes Hypertension in his mother. There is no history of Colon cancer, Stomach cancer, Liver cancer, or Throat cancer.  ROS:   Please see the history of present illness.    All other systems reviewed and are negative.  EKGs/Labs/Other Studies Reviewed:    The following studies were reviewed today: I discussed my findings with the patient at length.   Recent Labs: 12/07/2022: ALT 8; BUN 15; Creatinine, Ser 1.03; Hemoglobin 13.7; Platelets 147.0; Potassium 4.0;  Sodium 141  Recent Lipid Panel    Component Value Date/Time   CHOL 131 11/05/2019 0847   TRIG 52 11/05/2019 0847   HDL 46 11/05/2019 0847   CHOLHDL 2.8 11/05/2019 0847   LDLCALC 73 11/05/2019 0847    Physical Exam:    VS:  BP (!) 146/88   Pulse 70   Ht 6\' 4"  (1.93 m)   Wt 282 lb 3.2 oz (128 kg)   SpO2 96%   BMI 34.35 kg/m     Wt Readings from Last 3 Encounters:  01/21/23 282 lb 3.2 oz (128 kg)  12/19/22 291 lb (132 kg)  12/07/22 299 lb (135.6 kg)     GEN: Patient is in no acute distress HEENT: Normal NECK: No JVD; No carotid bruits LYMPHATICS: No lymphadenopathy CARDIAC: Hear sounds regular, 2/6 systolic murmur at the apex. RESPIRATORY:  Clear to auscultation without rales, wheezing or rhonchi  ABDOMEN: Soft, non-tender, non-distended MUSCULOSKELETAL:  No edema; No deformity  SKIN: Warm and dry NEUROLOGIC:  Alert and oriented x 3 PSYCHIATRIC:  Normal affect   Signed, Garwin Brothers, MD  01/21/2023 9:10 AM    [facility]

## 2023-01-21 NOTE — Patient Instructions (Signed)
Medication Instructions:  Your physician recommends that you continue on your current medications as directed. Please refer to the Current Medication list given to you today.  *If you need a refill on your cardiac medications before your next appointment, please call your pharmacy*   Lab Work: None ordered If you have labs (blood work) drawn today and your tests are completely normal, you will receive your results only by: MyChart Message (if you have MyChart) OR A paper copy in the mail If you have any lab test that is abnormal or we need to change your treatment, we will call you to review the results.   Testing/Procedures: None ordered   Follow-Up: At [facility], you and your health needs are our priority.  As part of our continuing mission to provide you with exceptional heart care, we have created designated Provider Care Teams.  These Care Teams include your primary Cardiologist (physician) and Advanced Practice Providers (APPs -  Physician Assistants and Nurse Practitioners) who all work together to provide you with the care you need, when you need it.  We recommend signing up for the patient portal called "MyChart".  Sign up information is provided on this After Visit Summary.  MyChart is used to connect with patients for Virtual Visits (Telemedicine).  Patients are able to view lab/test results, encounter notes, upcoming appointments, etc.  Non-urgent messages can be sent to your provider as well.   To learn more about what you can do with MyChart, go to [email or web address].    Your next appointment:   3 month(s)  The format for your next appointment:   In Person  Provider:   Rajan Revankar, MD    Other Instructions none  Important Information About Sugar      

## 2023-01-22 ENCOUNTER — Ambulatory Visit: Payer: PPO | Attending: Cardiology

## 2023-01-22 DIAGNOSIS — Z9889 Other specified postprocedural states: Secondary | ICD-10-CM | POA: Diagnosis not present

## 2023-01-22 DIAGNOSIS — R109 Unspecified abdominal pain: Secondary | ICD-10-CM | POA: Diagnosis not present

## 2023-01-22 DIAGNOSIS — I2510 Atherosclerotic heart disease of native coronary artery without angina pectoris: Secondary | ICD-10-CM | POA: Diagnosis not present

## 2023-01-22 DIAGNOSIS — Z8679 Personal history of other diseases of the circulatory system: Secondary | ICD-10-CM | POA: Diagnosis not present

## 2023-01-22 DIAGNOSIS — I739 Peripheral vascular disease, unspecified: Secondary | ICD-10-CM | POA: Diagnosis not present

## 2023-01-22 DIAGNOSIS — Z1330 Encounter for screening examination for mental health and behavioral disorders, unspecified: Secondary | ICD-10-CM | POA: Diagnosis not present

## 2023-01-22 MED ORDER — TECHNETIUM TC 99M TETROFOSMIN IV KIT
32.8000 | PACK | Freq: Once | INTRAVENOUS | Status: AC | PRN
  Administered 2023-01-22: 32.8 via INTRAVENOUS

## 2023-01-22 MED ORDER — REGADENOSON 0.4 MG/5ML IV SOLN
0.4000 mg | Freq: Once | INTRAVENOUS | Status: AC
  Administered 2023-01-22: 0.4 mg via INTRAVENOUS

## 2023-01-23 ENCOUNTER — Ambulatory Visit: Payer: PPO | Attending: Cardiology

## 2023-01-23 LAB — MYOCARDIAL PERFUSION IMAGING
LV dias vol: 188 mL (ref 62–150)
LV sys vol: 107 mL
Nuc Stress EF: 43 %
Rest Nuclear Isotope Dose: 31.3 mCi
SDS: 0
SRS: 1
SSS: 1
Stress Nuclear Isotope Dose: 32.8 mCi
TID: 0.92

## 2023-01-23 MED ORDER — TECHNETIUM TC 99M TETROFOSMIN IV KIT
31.3000 | PACK | Freq: Once | INTRAVENOUS | Status: AC | PRN
  Administered 2023-01-23: 31.3 via INTRAVENOUS

## 2023-01-24 ENCOUNTER — Telehealth: Payer: Self-pay | Admitting: Gastroenterology

## 2023-01-24 DIAGNOSIS — F0280 Dementia in other diseases classified elsewhere without behavioral disturbance: Secondary | ICD-10-CM | POA: Diagnosis not present

## 2023-01-24 DIAGNOSIS — Z8673 Personal history of transient ischemic attack (TIA), and cerebral infarction without residual deficits: Secondary | ICD-10-CM | POA: Diagnosis not present

## 2023-01-24 DIAGNOSIS — I10 Essential (primary) hypertension: Secondary | ICD-10-CM | POA: Diagnosis not present

## 2023-01-24 DIAGNOSIS — I4891 Unspecified atrial fibrillation: Secondary | ICD-10-CM | POA: Diagnosis not present

## 2023-01-24 DIAGNOSIS — I6523 Occlusion and stenosis of bilateral carotid arteries: Secondary | ICD-10-CM | POA: Diagnosis not present

## 2023-01-24 DIAGNOSIS — G300 Alzheimer's disease with early onset: Secondary | ICD-10-CM | POA: Diagnosis not present

## 2023-01-24 NOTE — Telephone Encounter (Signed)
Patients daughter called to return phone call from yesterday regarding referral for a vascular surgeon for patient. She is requesting to speak with Viviann Spare. Please advise.

## 2023-01-29 ENCOUNTER — Encounter: Payer: Self-pay | Admitting: Physician Assistant

## 2023-01-29 ENCOUNTER — Encounter: Payer: Self-pay | Admitting: Gastroenterology

## 2023-01-29 ENCOUNTER — Telehealth: Payer: Self-pay

## 2023-01-29 LAB — AMB REFERRAL TO VASCULAR SURGERY/CLINIC
A1c: 7.5
Albumin, Urine POC: 4.5
Albumin/Creatinine Ratio, Urine, POC: 88
EGFR: 81

## 2023-01-29 NOTE — Telephone Encounter (Signed)
[facility] Pre-operative Risk Assessment     Request for surgical clearance:     Endoscopy Procedure  What type of surgery is being performed?     Upper Endoscopy When is this surgery scheduled?     04/01/2023  What type of clearance is required ?   Medical  Are there any medications that need to be held prior to surgery and how long? Xarelto/ 2 days  Practice name and name of physician performing surgery?      [facility]/Dr. Chales Abrahams   What is your office phone and fax number?      Phone- [phone]  Fax- [phone]  Anesthesia type (None, local, MAC, general) ?       MAC

## 2023-01-29 NOTE — Telephone Encounter (Signed)
Pharmacy please advise on holding Xarelto prior to upper endoscopy scheduled for 04/01/2023. Thank you.

## 2023-01-29 NOTE — Telephone Encounter (Signed)
Spoke with pt daughter Marylene Land. Documented under result notes.  Marylene Land  verbalized understanding with all questions answered.

## 2023-01-29 NOTE — Telephone Encounter (Signed)
Patient with diagnosis of afib on Xarelto for anticoagulation.    Procedure: upper endoscopy Date of procedure: 04/01/23  CHA2DS2-VASc Score = 7  This indicates a 11.2% annual risk of stroke. The patient's score is based upon: CHF History: 0 HTN History: 1 Diabetes History: 1 Stroke History: 2 Vascular Disease History: 1 Age Score: 2 Gender Score: 0  TIA in 2015  CrCl 74mL/min using adj body weight Platelet count 152K  Per office protocol, would prefer 1 day Xarelto hold given prior TIA, however if 2 day hold is required should be ok. Pt should resume anticoag as soon as safely possible after.  **This guidance is not considered finalized until pre-operative APP has relayed final recommendations.**

## 2023-01-29 NOTE — Telephone Encounter (Signed)
Spoke with pt daughter Marylene Land. Documented in result notes.  Marylene Land verbalized understanding with all questions answered.

## 2023-01-29 NOTE — Telephone Encounter (Signed)
  Dr. Tomie China, you recently saw this pt in clinic on 01/21/2023. Please advise on surgical clearance for his endoscopy procedure scheduled for 04/01/2023 Please route your response to P CV DIV PREOP. Thank you!

## 2023-01-30 NOTE — Telephone Encounter (Signed)
   Patient Name: Ryan Vaughan  DOB: Feb 24, 1946 MRN: [id number]  Primary Cardiologist: None  Chart reviewed as part of pre-operative protocol coverage. Given past medical history and time since last visit, based on ACC/AHA guidelines, Ryan Vaughan is at acceptable risk for the planned procedure without further cardiovascular testing.  Per Dr. Tomie China patient is not at high risk for scheduled procedure.  Meticulous hemodynamic monitoring should be completed pre and during periprocedural period.  The patient was advised that if he develops new symptoms prior to surgery to contact our office to arrange for a follow-up visit, and he verbalized understanding.  Per office protocol, would prefer 1 day Xarelto hold given prior TIA, however if 2 day hold is required should be ok. Pt should resume anticoag as soon as safely possible after.   I will route this recommendation to the requesting party via Epic fax function and remove from pre-op pool.  Please call with questions.  Napoleon Form, Leodis Rains, NP 01/30/2023, 1:22 PM

## 2023-01-31 DIAGNOSIS — I6523 Occlusion and stenosis of bilateral carotid arteries: Secondary | ICD-10-CM | POA: Diagnosis not present

## 2023-01-31 DIAGNOSIS — Z9889 Other specified postprocedural states: Secondary | ICD-10-CM | POA: Diagnosis not present

## 2023-01-31 DIAGNOSIS — I739 Peripheral vascular disease, unspecified: Secondary | ICD-10-CM | POA: Diagnosis not present

## 2023-01-31 DIAGNOSIS — R109 Unspecified abdominal pain: Secondary | ICD-10-CM | POA: Diagnosis not present

## 2023-02-04 NOTE — Telephone Encounter (Signed)
Dr. Chales Abrahams please see note below and advise if you would like a 1 day hold or 2 day hold for pt Xarelto based on previous documentation: Please advise.

## 2023-02-05 DIAGNOSIS — I2510 Atherosclerotic heart disease of native coronary artery without angina pectoris: Secondary | ICD-10-CM | POA: Diagnosis not present

## 2023-02-05 DIAGNOSIS — I4891 Unspecified atrial fibrillation: Secondary | ICD-10-CM | POA: Diagnosis not present

## 2023-02-05 DIAGNOSIS — Z6834 Body mass index (BMI) 34.0-34.9, adult: Secondary | ICD-10-CM | POA: Diagnosis not present

## 2023-02-05 DIAGNOSIS — G300 Alzheimer's disease with early onset: Secondary | ICD-10-CM | POA: Diagnosis not present

## 2023-02-05 DIAGNOSIS — E785 Hyperlipidemia, unspecified: Secondary | ICD-10-CM | POA: Diagnosis not present

## 2023-02-05 DIAGNOSIS — E1129 Type 2 diabetes mellitus with other diabetic kidney complication: Secondary | ICD-10-CM | POA: Diagnosis not present

## 2023-02-05 DIAGNOSIS — F0280 Dementia in other diseases classified elsewhere without behavioral disturbance: Secondary | ICD-10-CM | POA: Diagnosis not present

## 2023-02-05 DIAGNOSIS — I10 Essential (primary) hypertension: Secondary | ICD-10-CM | POA: Diagnosis not present

## 2023-02-05 DIAGNOSIS — Z79899 Other long term (current) drug therapy: Secondary | ICD-10-CM | POA: Diagnosis not present

## 2023-02-05 DIAGNOSIS — E1165 Type 2 diabetes mellitus with hyperglycemia: Secondary | ICD-10-CM | POA: Diagnosis not present

## 2023-02-05 DIAGNOSIS — M109 Gout, unspecified: Secondary | ICD-10-CM | POA: Diagnosis not present

## 2023-02-05 NOTE — Telephone Encounter (Signed)
Per Dr Chales Abrahams only 1 day hold of Xarelto. Note made on previst appointment

## 2023-02-09 ENCOUNTER — Encounter: Payer: Self-pay | Admitting: Cardiology

## 2023-02-11 MED ORDER — RIVAROXABAN 2.5 MG PO TABS: 5.0000 mg | ORAL_TABLET | Freq: Every day | ORAL | 0 refills | Status: DC

## 2023-02-11 MED ORDER — RIVAROXABAN 15 MG PO TABS: 15.0000 mg | ORAL_TABLET | Freq: Every day | ORAL | 0 refills | Status: DC

## 2023-02-15 ENCOUNTER — Encounter: Payer: Self-pay | Admitting: Physician Assistant

## 2023-02-15 ENCOUNTER — Ambulatory Visit: Payer: PPO | Admitting: Physician Assistant

## 2023-02-15 VITALS — BP 130/86 | HR 43 | Ht 76.5 in | Wt 278.0 lb

## 2023-02-15 DIAGNOSIS — R413 Other amnesia: Secondary | ICD-10-CM | POA: Diagnosis not present

## 2023-02-15 NOTE — Patient Instructions (Signed)

## 2023-02-15 NOTE — Progress Notes (Signed)
Assessment/Plan:    The patient is seen in neurologic consultation at the request of Paulina Fusi, MD for the evaluation of memory.  Ryan Vaughan is a very pleasant 77 y.o. year old RH male with a history of hypertension, hyperlipidemia, atrial fibrillation on chronic Xarelto, history of CVA in 2015, CAD, RAS, vitamin D deficiency, DM2, bilateral carotid artery stenosis, GERD, seen today for evaluation of memory loss. MoCA today is 16/30 .He is already on donepezil 10 mg daily, and memantine 5 mg twice daily per PCP, tolerating well. Etiology is unclear but there might be a vascular and situational depression component to the symptoms. He is able to participate on his ADL and continues to drive without difficulties.    Memory Impairment  MRI brain without contrast to assess for underlying structural abnormality and assess vascular load  Continue donepezil 10 mg daily and memantine 5 mg daily . Side effects were discussed  Neurocognitive testing to further evaluate cognitive concerns and determine other underlying cause of memory changes, including potential contribution from sleep, anxiety, or depression  Recommend psychotherapy for situational depression.  Mood control meds as per PCP. Continue good control of cardiovascular risk factors.  Discussed with the patient the need to adherence to all his cardiac medications including Xarelto Check B12, TSH Recommend increasing social and physical activities Folllow up in 3 months   Subjective:    The patient is accompanied by his wife and his daughter who supplements the history.    How long did patient have memory difficulties? For about 1 year, worse over the last 6 months, especially wit STM.  Family noticed memory changes since Christmas of 2023.  He reports that he forgets new information.  He initially attributed  the symptoms to some depression but for the last 3 months, he has significant difficulty with numbers.  He denies any  issues with LTM . repeats oneself?  Endorsed "every 5 minutes he asks the same question and tells the same story"-wife reports.  (He did not repeat himself during this visit) Disoriented when walking into a room?  Patient denies except occasionally not remembering what patient came to the room for   Leaving objects in unusual places?   denies other than occasionally misplacing his glasses.  Wandering behavior? denies   Any personality changes ? denies   Any history of depression?:  Patient denies but daughter reports that he may be experiencing situational depression, with less desire of doing activities outside, having a feeling of worthlessness, decreased appetite.  His daughter states that "my parents do not believe in mental illness "but some of his friends have noticed that he is depressed. Hallucinations or paranoia?  denies   Seizures? denies    Any sleep changes?  Sleeps well "too much "- daughter says.  Denies vivid dreams, REM behavior or sleepwalking   Sleep apnea? denies   Any hygiene concerns?  denies   Independent of bathing and dressing?  Endorsed  Does the patient need help with medications? Wife is in charge for the last 2 months as he was not taking Xarelto.    Who is in charge of the finances? Patient  is in charge     Any changes in appetite? Decreased. Sees GI because he has early satiation, for endoscopy in 04/2023.   Patient have trouble swallowing?  denies   Does the patient cook? No  Any kitchen accidents such as leaving the stove on? Patient denies   Any headaches?  denies   Chronic back pain?  Knee and back chronic pain due to arthritis.  "I need surgery " Ambulates with difficulty? denies   Recent falls or head injuries? denies     Vision changes? Unilateral weakness, numbness or tingling?  denies   Any tremors?  denies   Any anosmia?  denies   Any incontinence of urine? denies   Any bowel dysfunction? denies      Patient lives with wife  History of heavy  alcohol intake? denies   History of heavy tobacco use? denies   Family history of dementia?  Denies  Does patient drive? Yes. Daughter says sometimes he has to think a little more about the directions, but she not worried about him getting lost.  Retired Education administrator.  MRI of the brain at [facility] (no images available) from 08/12/2022 showed moderate chronic small invasive disease with tiny embolic strokes in the left posterior frontal vertex and left occipital lobe.  Allergies  Allergen Reactions   Penicillins Rash    Did it involve swelling of the face/tongue/throat, SOB, or low BP? No Did it involve sudden or severe rash/hives, skin peeling, or any reaction on the inside of your mouth or nose? No Did you need to seek medical attention at a hospital or doctor's office? No When did it last happen?      2-3 years If all above answers are "NO", may proceed with cephalosporin use.    Amiodarone Hives   Isosorbide Mononitrate Er [Isosorbide Dinitrate] Other (See Comments)    Headache and dizziness   Losartan Swelling    Current Outpatient Medications  Medication Instructions   acetaminophen (TYLENOL) 500-1,500 mg, Oral, Every 8 hours PRN   amLODipine (NORVASC) 5 mg, Oral, Daily   aspirin EC 81 mg, Oral, Daily   atorvastatin (LIPITOR) 80 mg, Oral, Daily   carvedilol (COREG) 12.5 mg, Oral, 2 times daily   donepezil (ARICEPT) 5 mg, Oral, Daily at bedtime   gabapentin (NEURONTIN) 800 mg, Oral, As needed   glimepiride (AMARYL) 4 mg, Oral, 2 times daily   Lantus SoloStar 25 Units, Subcutaneous, Daily at bedtime   lisinopril (ZESTRIL) 20 mg, Oral, Daily   memantine (NAMENDA) 5 mg, Oral, 2 times daily   nitroGLYCERIN (NITROSTAT) 0.4 mg, Sublingual, Every 5 min PRN   pantoprazole (PROTONIX) 40 mg, Oral, Daily   rivaroxaban (XARELTO) 20 mg, Oral, Daily with supper   rivaroxaban (XARELTO) 5 mg, Oral, Daily   Rivaroxaban (XARELTO) 15 mg, Oral, Daily with supper   Vitamin D  (Ergocalciferol) (DRISDOL) 50,000 Units, Oral, Every Fri     VITALS:   Vitals:   02/15/23 0738 02/15/23 0917  BP: (!) 200/99 130/86  Pulse: (!) 43   SpO2: 97%   Weight: 278 lb (126.1 kg)   Height: 6' 4.5" (1.943 m)        No data to display          PHYSICAL EXAM   HEENT:  Normocephalic, atraumatic. The mucous membranes are moist. The superficial temporal arteries are without ropiness or tenderness. Cardiovascular: Regular rate and rhythm. Lungs: Clear to auscultation bilaterally. Neck: There are no carotid bruits noted bilaterally.  NEUROLOGICAL:    02/15/2023    9:00 AM  Montreal Cognitive Assessment   Visuospatial/ Executive (0/5) 3  Naming (0/3) 2  Attention: Read list of digits (0/2) 2  Attention: Read list of letters (0/1) 1  Attention: Serial 7 subtraction starting at 100 (0/3) 3  Language: Repeat phrase (0/2) 0  Language : Fluency (0/1) 0  Abstraction (0/2) 2  Delayed Recall (0/5) 0  Orientation (0/6) 2  Total 15  Adjusted Score (based on education) 16        No data to display           Orientation:  Alert and oriented to person, place and not to date.  No aphasia or dysarthria. Fund of knowledge is appropriate. Recent and remote memory impaired.  Attention and concentration are normal.  Able to name objects and repeat phrases. Delayed recall 0/5 Cranial nerves: There is good facial symmetry. Extraocular muscles are intact and visual fields are full to confrontational testing. Speech is fluent and clear. no tongue deviation. Hearing is slightly decreased to conversational tone.  Tone: Tone is good throughout. Sensation: Sensation is intact to light touch and pinprick throughout. Vibration is intact at the bilateral big toe.  Coordination: The patient has no difficulty with RAM's or FNF bilaterally. Normal finger to nose  Motor: Strength is 5/5 in the bilateral upper and lower extremities. There is no pronator drift. There are no fasciculations  noted. DTR's: Deep tendon reflexes are 1/4 at the bilateral biceps, triceps, brachioradialis, patella and achilles.  Plantar responses are downgoing bilaterally. Gait and Station: The patient is able to ambulate without difficulty.The patient is able to ambulate in a tandem fashion, able to stand in the Romberg position.  Gait is cautious and narrow.  Stride is normal.     Thank you for allowing [place] the opportunity to participate in the care of this nice patient. Please do not hesitate to contact us for any questions or concerns.   Total time spent on today's visit was 61 minutes dedicated to this patient today, preparing to see patient, examining the patient, ordering tests and/or medications and counseling the patient, documenting clinical information in the EHR or other health record, independently interpreting results and communicating results to the patient/family, discussing treatment and goals, answering patient's questions and coordinating care.  Cc:  Paulina Fusi, MD  Marlowe Kays 02/15/2023 9:21 AM

## 2023-02-19 DIAGNOSIS — F331 Major depressive disorder, recurrent, moderate: Secondary | ICD-10-CM | POA: Diagnosis not present

## 2023-02-26 ENCOUNTER — Encounter: Payer: Self-pay | Admitting: Physician Assistant

## 2023-02-26 DIAGNOSIS — R413 Other amnesia: Secondary | ICD-10-CM

## 2023-02-26 NOTE — Telephone Encounter (Signed)
Acutally sent message to [facility]

## 2023-02-27 ENCOUNTER — Ambulatory Visit
Admission: RE | Admit: 2023-02-27 | Discharge: 2023-02-27 | Disposition: A | Discharge status: Home / Self Care | Payer: PPO | Source: Ambulatory Visit | Attending: Physician Assistant | Admitting: Physician Assistant

## 2023-02-27 DIAGNOSIS — R413 Other amnesia: Secondary | ICD-10-CM | POA: Diagnosis not present

## 2023-02-27 DIAGNOSIS — I6782 Cerebral ischemia: Secondary | ICD-10-CM | POA: Diagnosis not present

## 2023-02-28 DIAGNOSIS — R413 Other amnesia: Secondary | ICD-10-CM | POA: Diagnosis not present

## 2023-02-28 DIAGNOSIS — I10 Essential (primary) hypertension: Secondary | ICD-10-CM | POA: Diagnosis not present

## 2023-02-28 DIAGNOSIS — E785 Hyperlipidemia, unspecified: Secondary | ICD-10-CM | POA: Diagnosis not present

## 2023-02-28 DIAGNOSIS — I480 Paroxysmal atrial fibrillation: Secondary | ICD-10-CM | POA: Diagnosis not present

## 2023-02-28 DIAGNOSIS — Z7901 Long term (current) use of anticoagulants: Secondary | ICD-10-CM | POA: Diagnosis not present

## 2023-03-01 ENCOUNTER — Other Ambulatory Visit (INDEPENDENT_AMBULATORY_CARE_PROVIDER_SITE_OTHER): Payer: PPO

## 2023-03-01 DIAGNOSIS — R413 Other amnesia: Secondary | ICD-10-CM | POA: Diagnosis not present

## 2023-03-01 LAB — TSH: TSH: 0.49 u[IU]/mL (ref 0.35–5.50)

## 2023-03-01 LAB — VITAMIN B12: Vitamin B-12: 304 pg/mL (ref 211–911)

## 2023-03-01 NOTE — Progress Notes (Signed)
B12 is on the lower side.  Recommend   vitamin B12 1000 mcg daily.  Thyroid levels are normal  Follow-up with PCP.  Thank you

## 2023-03-02 DIAGNOSIS — I63532 Cerebral infarction due to unspecified occlusion or stenosis of left posterior cerebral artery: Secondary | ICD-10-CM

## 2023-03-02 HISTORY — DX: Cerebral infarction due to unspecified occlusion or stenosis of left posterior cerebral artery: I63.532

## 2023-03-05 ENCOUNTER — Ambulatory Visit (AMBULATORY_SURGERY_CENTER): Payer: PPO | Admitting: *Deleted

## 2023-03-05 VITALS — Ht 76.5 in | Wt 281.0 lb

## 2023-03-05 DIAGNOSIS — R1084 Generalized abdominal pain: Secondary | ICD-10-CM

## 2023-03-05 NOTE — Progress Notes (Signed)
Pt's name and DOB verified at the beginning of the pre-visit. Wife present for visit Pt denies any difficulty with ambulating,sitting, laying down or rolling side to side Gave both [facility] main # and MD on call # prior to instructions.  No egg or soy allergy known to patient  No issues known to pt with past sedation with any surgeries or procedures Pt denies having issues being intuba Pt has no issues moving head neck or swallowing No FH of Malignant Hyperthermia Pt is not on diet pills Pt is not on home 02  Pt is on blood thinners and as ordered he is to not take the med the day before the procedure or day of the procedure Pt denies issues with constipation  Pt is not on dialysis Pt denise any abnormal heart rhythms  Pt denies any upcoming cardiac testing Pt encouraged to use to use Singlecare or Goodrx to reduce cost  Patient's chart reviewed by Cathlyn Parsons CNRA prior to pre-visit and patient appropriate for the [facility].  Pre-visit completed and red dot placed by patient's name on their procedure day (on provider's schedule).  . Visit in person Pt states weight is 281 lb Instructed pt why it is important to and  to call if they have any changes in health or new medications. Directed them to the # given and on instructions.   Pt states they will.  Instructions reviewed with pt and pt states understanding. Instructed to review again prior to procedure. Pt states they will.  Instructions given to pt and  sent to my chart

## 2023-03-06 MED ORDER — RIVAROXABAN 20 MG PO TABS: 20.0000 mg | ORAL_TABLET | Freq: Every day | ORAL | 0 refills | Status: DC

## 2023-03-06 MED ORDER — RIVAROXABAN 10 MG PO TABS: 20.0000 mg | ORAL_TABLET | Freq: Every day | ORAL | 0 refills | Status: DC

## 2023-03-06 NOTE — Addendum Note (Signed)
Addended by: Eleonore Chiquito on: 03/06/2023 01:59 PM   Modules accepted: Orders

## 2023-03-12 ENCOUNTER — Other Ambulatory Visit (HOSPITAL_COMMUNITY): Payer: Self-pay

## 2023-03-12 NOTE — Telephone Encounter (Signed)
Per test claim no PA is required. Drug is covered. (This is for a 30 day supply)   Here is a breakdown of our cost, but cost can vary from pharmacy to pharmacy. Our acquisition for the drug may be higher or lower than the filling pharmacy.     I did submit to [facility] to see what HTA says and this was the response

## 2023-03-13 NOTE — Telephone Encounter (Signed)
-----   Message from Marcos Eke, PA-C sent at 03/12/2023  9:37 AM EDT ----- Doreene Eland, need to order a CTA head and neck to check on the circulation. The MRI of the brain showed some circulation changes where he had the prior stroke, I don't know if he had a "subacute (not new, but new form prior imaging) , wanted to make sure everything is ok. He was on Xarelto, he needs to stick to the medicine, cannot forget a dose, as it would put him at a risk for a stroke. If you can order and call him it would be great Thanks !

## 2023-03-13 NOTE — Telephone Encounter (Signed)
Patient advised and poa of orders for cta head and neck, sent to [facility]

## 2023-03-13 NOTE — Telephone Encounter (Signed)
pati

## 2023-03-13 NOTE — Telephone Encounter (Signed)
Thank you so much Kayla.

## 2023-03-13 NOTE — Telephone Encounter (Signed)
-----   Message from Sara E Wertman, PA-C sent at 03/12/2023  9:37 AM EDT ----- Hi Sheryl Towell, need to order a CTA head and neck to check on the circulation. The MRI of the brain showed some circulation changes where he had the prior stroke, I don't know if he had a "subacute (not new, but new form prior imaging) , wanted to make sure everything is ok. He was on Xarelto, he needs to stick to the medicine, cannot forget a dose, as it would put him at a risk for a stroke. If you can order and call him it would be great Thanks !   

## 2023-03-15 ENCOUNTER — Encounter: Payer: Self-pay | Admitting: Gastroenterology

## 2023-04-01 ENCOUNTER — Ambulatory Visit (AMBULATORY_SURGERY_CENTER): Payer: PPO | Admitting: Gastroenterology

## 2023-04-01 ENCOUNTER — Encounter: Payer: Self-pay | Admitting: Gastroenterology

## 2023-04-01 VITALS — BP 156/96 | HR 70 | Temp 97.3°F | Resp 14 | Ht 76.5 in | Wt 281.0 lb

## 2023-04-01 DIAGNOSIS — I10 Essential (primary) hypertension: Secondary | ICD-10-CM | POA: Diagnosis not present

## 2023-04-01 DIAGNOSIS — I2510 Atherosclerotic heart disease of native coronary artery without angina pectoris: Secondary | ICD-10-CM | POA: Diagnosis not present

## 2023-04-01 DIAGNOSIS — K2950 Unspecified chronic gastritis without bleeding: Secondary | ICD-10-CM | POA: Diagnosis not present

## 2023-04-01 DIAGNOSIS — R1084 Generalized abdominal pain: Secondary | ICD-10-CM

## 2023-04-01 DIAGNOSIS — K2970 Gastritis, unspecified, without bleeding: Secondary | ICD-10-CM | POA: Diagnosis not present

## 2023-04-01 DIAGNOSIS — E119 Type 2 diabetes mellitus without complications: Secondary | ICD-10-CM | POA: Diagnosis not present

## 2023-04-01 DIAGNOSIS — J449 Chronic obstructive pulmonary disease, unspecified: Secondary | ICD-10-CM | POA: Diagnosis not present

## 2023-04-01 DIAGNOSIS — K219 Gastro-esophageal reflux disease without esophagitis: Secondary | ICD-10-CM | POA: Diagnosis not present

## 2023-04-01 MED ORDER — SODIUM CHLORIDE 0.9 % IV SOLN: 500.0000 mL | Freq: Once | INTRAVENOUS | Status: DC

## 2023-04-01 NOTE — Progress Notes (Signed)
Chief Complaint: Abdo pain  Referring Provider:  Paulina Fusi, MD      ASSESSMENT AND PLAN;   #1. Post-prandial Abdo pain. Doubt GB as etiology.  History of Goody powder intake is concerning. CTA  Showing mesenteric stenosis.  Evaluated by Dr. Lindley Magnus (vascular)-  kind of atypical for mesenteric ischemia.  Recommend to proceed with EGD.  #2. H/O A Fib on Xarelto (EF 55-60% 11/2020), DM2, CAD s/p CABG, HLD, HTN, gout, PAD  #3. WT loss  Plan: -   EGD today - continue Protonix 40 mg p.o. daily      HPI:    Ryan Vaughan is a 77 y.o. male  With H/O A Fib on Xarelto (EF 55-60% 11/2020), stable ascending thoracic aortic dilatation, DM, CAD s/p CABG, HLD, HTN, gout, PAD, asymptomatic cholelithiasis. With H/O colonic polyps in the past.  With upper abdominal pain-mostly left-sided With associated nausea but no vomiting Has been having some heartburn No regurgitation Gets worse after eating, lasting 2 to 3 hours and then goes away. Also associated abdominal bloating No jaundice dark urine or pale stools. Has been using significant Goody powders for back pain per patient's wife  Denies having any diarrhea or constipation.  No melena or hematochezia.   Past GI procedures: Colonoscopy 08/2012 ([facility]): 1 cm flat sessile polyp in proximal ascending colon s/p polypectomy, 2.5 cm rectal polyp s/p EMR, tattooing, Mod sigmoid diverticulosis.  Subsequent flexible sigmoidoscopy 11/2012 showed minimal residual polyp s/p polypectomy.  Bx- TA. He was advised to repeat in 3 yrs. He did get the letter  Colon 05/02/2021 - Two 4 to 6 mm polyps in the mid sigmoid colon and in the distal sigmoid colon, removed with a cold snare. Resected and retrieved. - Moderate predominantly sigmoid diverticulosis. - Non-bleeding internal hemorrhoids. - Bx- TA - No need to rpt unless problems.  CT chest 08/2022 1. Stable moderate dilation of the ascending thoracic aorta, unchanged dating back to  2016. Could consider the following as warranted. Recommend annual imaging followup by CTA or MRA. This recommendation follows 2010 ACCF/AHA/AATS/ACR/ASA/SCA/SCAI/SIR/STS/SVM Guidelines for the Diagnosis and Management of Patients with Thoracic Aortic Disease. Circulation. 2010; 121: Z610-R604. Aortic aneurysm NOS (ICD10-I71.9) 2. Signs of cholelithiasis, pancreatic atrophy and vascular disease in the abdomen. 3. Aortic atherosclerosis and calcified coronary artery disease post median sternotomy and clipping of the LEFT atrial appendage. 4. Signs of cholelithiasis. 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 Ascending colon polyp status post polypectomy.   CORONARY ARTERY BYPASS GRAFT N/A 09/10/2019   Procedure: CORONARY ARTERY BYPASS GRAFTING (CABG) x4 , using left internal mammary artery, and right leg greater saphenous vein harvested endoscopically;  Surgeon: Alleen Borne, MD;  Location: [facility] OR;  Service: Open Heart Surgery;  Laterality: N/A;   LEFT HEART CATH AND CORONARY ANGIOGRAPHY N/A 08/28/2019   Procedure: LEFT HEART CATH AND CORONARY ANGIOGRAPHY;  Surgeon: Lyn Records, MD;  Location: [facility] INVASIVE CV LAB;  Service: Cardiovascular;  Laterality: N/A;   SIGMOIDOSCOPY  11/17/2012   Minimal residual polyp, status post polypectomy. Small internal hemorrhoids.   TEE WITHOUT CARDIOVERSION N/A 09/10/2019   Procedure: TRANSESOPHAGEAL ECHOCARDIOGRAM (TEE);  Surgeon: Alleen Borne, MD;  Location: [facility] OR;  Service: Open Heart Surgery;  Laterality: N/A;    Family History  Problem Relation Age of Onset   Hypertension Mother    Colon cancer Neg Hx    Stomach cancer Neg Hx    Liver cancer Neg Hx    Throat cancer Neg Hx    Colon polyps Neg Hx    Esophageal cancer Neg Hx    Rectal cancer Neg Hx     Social History   Tobacco Use   Smoking status: Never   Smokeless tobacco: Never  Vaping Use   Vaping Use: Never used  Substance Use Topics   Alcohol use: No   Drug use: No    Current Outpatient Medications  Medication Sig Dispense Refill   amLODipine (NORVASC) 5 MG tablet Take 5 mg by mouth daily.      aspirin EC 81 MG tablet Take 81 mg by mouth daily.     atorvastatin (LIPITOR) 80 MG tablet TAKE 1 TABLET BY MOUTH ONCE DAILY 90 tablet 3   carvedilol (COREG) 12.5 MG tablet Take 1 tablet (12.5 mg total) by mouth 2 (two) times daily. 180 tablet 1   cyanocobalamin (VITAMIN B12) 1000 MCG tablet Take 1,000 mcg by mouth daily.     donepezil (ARICEPT) 5 MG tablet Take 5 mg by mouth at bedtime.     escitalopram (LEXAPRO) 5 MG tablet Take 5 mg by mouth daily.     glimepiride (AMARYL) 4 MG tablet Take 4 mg by mouth 2 (two) times daily.     LANTUS SOLOSTAR 100 UNIT/ML Solostar Pen Inject 25 Units into the skin at bedtime.     lisinopril (ZESTRIL) 20 MG tablet Take 20 mg by mouth daily.     memantine (NAMENDA) 5 MG tablet Take 5 mg by mouth 2 (two) times daily.     pantoprazole (PROTONIX) 40 MG tablet Take 1 tablet (40 mg total) by mouth daily. 30 tablet 6  Vitamin D, Ergocalciferol, (DRISDOL) 1.25 MG (50000 UT) CAPS capsule Take 50,000 Units by mouth every Friday.      acetaminophen (TYLENOL) 500 MG tablet Take 500-1,500 mg by mouth every 8 (eight) hours as needed for moderate pain or headache.     gabapentin (NEURONTIN) 800 MG tablet Take 800 mg by mouth as needed for pain.     nitroGLYCERIN (NITROSTAT) 0.4 MG SL tablet Place 1 tablet (0.4 mg total) under the tongue every 5 (five) minutes as needed for chest pain. 30 tablet 4   rivaroxaban (XARELTO) 20 MG TABS tablet Take 1 tablet (20 mg total) by mouth daily with supper. 21 tablet 0   Current Facility-Administered Medications  Medication Dose Route Frequency Provider Last Rate Last Admin   0.9 %  sodium chloride infusion  500 mL Intravenous Once Lynann Bologna, MD        Allergies  Allergen Reactions   Penicillins Rash    Did it involve swelling of the face/tongue/throat, SOB, or low BP? No Did it involve sudden or severe rash/hives, skin peeling, or any reaction on the inside of your mouth or nose? No Did you need to seek medical attention at a  hospital or doctor's office? No When did it last happen?      2-3 years If all above answers are "NO", may proceed with cephalosporin use.    Amiodarone Hives   Isosorbide Mononitrate Er [Isosorbide Dinitrate] Other (See Comments)    Headache and dizziness   Losartan Swelling    Review of Systems:  Constitutional: Denies fever, chills, diaphoresis, appetite change and fatigue.  HEENT: Denies photophobia, eye pain, redness, hearing loss, ear pain, congestion, sore throat, rhinorrhea, sneezing, mouth sores, neck pain, neck stiffness and tinnitus.   Respiratory: Denies SOB, DOE, cough, chest tightness,  and wheezing.   Cardiovascular: Denies chest pain, palpitations and leg swelling.  Genitourinary: Denies dysuria, urgency, frequency, hematuria, flank pain and difficulty urinating.  Musculoskeletal: Denies myalgias, back pain, joint swelling, arthralgias and gait problem.  Skin: No rash.  Neurological: Denies dizziness, seizures, syncope, weakness, light-headedness, numbness and headaches.  Hematological: Denies adenopathy. Easy bruising, personal or family bleeding history  Psychiatric/Behavioral: No anxiety or depression     Physical Exam:    BP (!) 143/81   Pulse 77   Temp (!) 97.3 F (36.3 C) (Temporal)   Ht 6' 4.5" (1.943 m)   Wt 281 lb (127.5 kg)   SpO2 98%   BMI 33.76 kg/m  Wt Readings from Last 3 Encounters:  04/01/23 281 lb (127.5 kg)  03/05/23 281 lb (127.5 kg)  02/15/23 278 lb (126.1 kg)   Constitutional:  Well-developed, in no acute distress. Psychiatric: Normal mood and affect. Behavior is normal. HEENT: Pupils normal.  Conjunctivae are normal. No scleral icterus. Neck supple.  Cardiovascular: Normal rate, regular rhythm. No edema Pulmonary/chest: Effort normal and breath sounds normal. No wheezing, rales or rhonchi. Abdominal: Soft, nondistended. Nontender. Bowel sounds active throughout. There are no masses palpable. No hepatomegaly. Rectal:  Deferred Neurological: Alert and oriented to person place and time. Skin: Skin is warm and dry. No rashes noted.  Data Reviewed: I have personally reviewed following labs and imaging studies  CBC:    Latest Ref Rng & Units 12/07/2022   11:32 AM 11/05/2019    8:47 AM 10/05/2019    8:46 AM  CBC  WBC 4.0 - 10.5 K/uL 5.9  5.8  8.9   Hemoglobin 13.0 - 17.0 g/dL 54.0  98.1  11.3  Hematocrit 39.0 - 52.0 % 40.5  38.0  33.2   Platelets 150.0 - 400.0 K/uL 147.0  169  243     CMP:    Latest Ref Rng & Units 12/07/2022   11:32 AM 11/05/2019    8:47 AM 10/05/2019    8:46 AM  CMP  Glucose 70 - 99 mg/dL 161  096  045   BUN 6 - 23 mg/dL 15  12  17    Creatinine 0.40 - 1.50 mg/dL 4.09  8.11  9.14   Sodium 135 - 145 mEq/L 141  140  139   Potassium 3.5 - 5.1 mEq/L 4.0  4.7  5.0   Chloride 96 - 112 mEq/L 106  107  103   CO2 19 - 32 mEq/L 27  20  24    Calcium 8.4 - 10.5 mg/dL 9.0  8.8  9.4   Total Protein 6.0 - 8.3 g/dL 6.9  7.0  7.0   Total Bilirubin 0.2 - 1.2 mg/dL 0.7  0.5  0.3   Alkaline Phos 39 - 117 U/L 43  66  72   AST 0 - 37 U/L 17  37  9   ALT 0 - 53 U/L 8  61  7        Ryan Circle, MD 04/01/2023, 2:39 PM  Cc: Paulina Fusi, MD

## 2023-04-01 NOTE — Progress Notes (Signed)
Report to PACU, RN, vss, BBS= Clear.  

## 2023-04-01 NOTE — Progress Notes (Signed)
Called to room to assist during endoscopic procedure.  Patient ID and intended procedure confirmed with present staff. Received instructions for my participation in the procedure from the performing physician.  

## 2023-04-01 NOTE — Op Note (Signed)
[facility] Patient Name: Ryan Vaughan Procedure Date: 04/01/2023 2:36 PM MRN: [id number] Endoscopist: Lynann Bologna , MD, [id number] Age: 77 Referring MD:  Date of Birth: 11/19/1945 Gender: Male Account #: [id number] Procedure:                Upper GI endoscopy Indications:              Epigastric abdominal pain with wt loss. CTA with                            chronic mesenteric stenosis. Being followed by                            vascular. Medicines:                Monitored Anesthesia Care Procedure:                Pre-Anesthesia Assessment:                           - Prior to the procedure, a History and Physical                            was performed, and patient medications and                            allergies were reviewed. The patient's tolerance of                            previous anesthesia was also reviewed. The risks                            and benefits of the procedure and the sedation                            options and risks were discussed with the patient.                            All questions were answered, and informed consent                            was obtained. Prior Anticoagulants: Xarelto was                            held 1 day prior. ASA Grade Assessment: II - A                            patient with mild systemic disease. After reviewing                            the risks and benefits, the patient was deemed in                            satisfactory condition to undergo the procedure.  After obtaining informed consent, the endoscope was                            passed under direct vision. Throughout the                            procedure, the patient's blood pressure, pulse, and                            oxygen saturations were monitored continuously. The                            Olympus Scope [id number] was introduced through the                            mouth, and advanced to the second  part of duodenum.                            The upper GI endoscopy was accomplished without                            difficulty. The patient tolerated the procedure                            well. Scope In: Scope Out: Findings:                 The examined esophagus was normal with well-defined                            Z-line at 45 cm, examined by NBI.                           Localized moderate inflammation characterized by                            erythema was found in the gastric antrum. Biopsies                            were taken with a cold forceps for histology.                            Incidental note was made of a small diverticulum in                            the fundus. The stomach was J-shaped.                           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 The scope easily passed beyond.                            There were no ulcers or erosions. Biopsies were                            taken with a cold forceps for histology. Complications:            No immediate complications. Estimated Blood Loss:     Estimated blood loss: none. Impression:               - Resolving moderate gastritis. Recommendation:           - Patient has a contact number available for                            emergencies. The signs and symptoms of potential                            delayed complications were discussed with the                            patient. Return to normal activities tomorrow.                            Written discharge instructions were provided to the                            patient.                           - Resume previous diet.                           - Continue present medications including Protonix                            40 mg p.o. daily.                           - Resume Xarelto 7/2                            - Await pathology results.                           - Avoid nonsteroidals.                           - His weight has stabilized. If any further weight                            loss would recommend further evaluation by means of                            gastric emptying scan                           -  FU in 12 weeks. Earlier, if with problems Or                            further weight loss.                           - The findings and recommendations were discussed                            with the patient's family. Lynann Bologna, MD 04/01/2023 3:01:34 PM This report has been signed electronically.

## 2023-04-01 NOTE — Progress Notes (Signed)
VS completed by EC.   Pt's states no medical or surgical changes since previsit or office visit.  

## 2023-04-01 NOTE — Patient Instructions (Addendum)
Continue present medications including Protonix 40 mg daily. Resume Xarelto 7/2 Await pathology results. Avoid nonsteroidals- Ibuprofen, Advil, Motrin, Naproxen, BC powders.  Tylenol is ok FU in 12 weeks. Earlier, if with problems Or further weight loss.                           YOU HAD AN ENDOSCOPIC PROCEDURE TODAY AT THE [facility]:   Refer to the procedure report that was given to you for any specific questions about what was found during the examination.  If the procedure report does not answer your questions, please call your gastroenterologist to clarify.  If you requested that your care partner not be given the details of your procedure findings, then the procedure report has been included in a sealed envelope for you to review at your convenience later.  YOU SHOULD EXPECT: Some feelings of bloating in the abdomen. Passage of more gas than usual.  Walking can help get rid of the air that was put into your GI tract during the procedure and reduce the bloating.   Please Note:  You might notice some irritation and congestion in your nose or some drainage.  This is from the oxygen used during your procedure.  There is no need for concern and it should clear up in a day or so.  SYMPTOMS TO REPORT IMMEDIATELY:  Following upper endoscopy (EGD)  Vomiting of blood or coffee ground material  New chest pain or pain under the shoulder blades  Painful or persistently difficult swallowing  New shortness of breath  Fever of 100F or higher  Black, tarry-looking stools  For urgent or emergent issues, a gastroenterologist can be reached at any hour by calling [phone]. Do not use MyChart messaging for urgent concerns.    DIET:  We do recommend a small meal at first, but then you may proceed to your regular diet.  Drink plenty of fluids but you should avoid alcoholic beverages for 24 hours.  ACTIVITY:  You should plan to take it easy for the rest of today and you should NOT  DRIVE or use heavy machinery until tomorrow (because of the sedation medicines used during the test).    FOLLOW UP: Our staff will call the number listed on your records the next business day following your procedure.  We will call around 7:15- 8:00 am to check on you and address any questions or concerns that you may have regarding the information given to you following your procedure. If we do not reach you, we will leave a message.     If any biopsies were taken you will be contacted by phone or by letter within the next 1-3 weeks.  Please call us at [phone] if you have not heard about the biopsies in 3 weeks.    SIGNATURES/CONFIDENTIALITY: You and/or your care partner have signed paperwork which will be entered into your electronic medical record.  These signatures attest to the fact that that the information above on your After Visit Summary has been reviewed and is understood.  Full responsibility of the confidentiality of this discharge information lies with you and/or your care-partner.

## 2023-04-02 ENCOUNTER — Telehealth: Payer: Self-pay

## 2023-04-02 DIAGNOSIS — F0280 Dementia in other diseases classified elsewhere without behavioral disturbance: Secondary | ICD-10-CM | POA: Diagnosis not present

## 2023-04-02 DIAGNOSIS — Z139 Encounter for screening, unspecified: Secondary | ICD-10-CM | POA: Diagnosis not present

## 2023-04-02 DIAGNOSIS — G300 Alzheimer's disease with early onset: Secondary | ICD-10-CM | POA: Diagnosis not present

## 2023-04-02 DIAGNOSIS — F331 Major depressive disorder, recurrent, moderate: Secondary | ICD-10-CM | POA: Diagnosis not present

## 2023-04-02 NOTE — Telephone Encounter (Signed)
Follow up call placed, no answer and no VM. 

## 2023-04-05 ENCOUNTER — Encounter: Payer: Self-pay | Admitting: Cardiology

## 2023-04-05 MED ORDER — RIVAROXABAN 20 MG PO TABS: 20.0000 mg | ORAL_TABLET | Freq: Every day | ORAL | 0 refills | Status: DC

## 2023-04-12 ENCOUNTER — Encounter: Payer: Self-pay | Admitting: Gastroenterology

## 2023-04-15 DIAGNOSIS — F329 Major depressive disorder, single episode, unspecified: Secondary | ICD-10-CM | POA: Insufficient documentation

## 2023-04-15 DIAGNOSIS — F32A Depression, unspecified: Secondary | ICD-10-CM | POA: Insufficient documentation

## 2023-04-15 DIAGNOSIS — J439 Emphysema, unspecified: Secondary | ICD-10-CM | POA: Insufficient documentation

## 2023-04-16 ENCOUNTER — Ambulatory Visit
Admission: RE | Admit: 2023-04-16 | Discharge: 2023-04-16 | Disposition: A | Discharge status: Home / Self Care | Payer: PPO | Source: Ambulatory Visit | Attending: Physician Assistant | Admitting: Physician Assistant

## 2023-04-16 ENCOUNTER — Ambulatory Visit: Admission: RE | Admit: 2023-04-16 | Payer: PPO | Source: Ambulatory Visit

## 2023-04-16 DIAGNOSIS — R413 Other amnesia: Secondary | ICD-10-CM | POA: Diagnosis not present

## 2023-04-16 MED ORDER — IOPAMIDOL (ISOVUE-370) INJECTION 76%
75.0000 mL | Freq: Once | INTRAVENOUS | Status: AC | PRN
  Administered 2023-04-16: 75 mL via INTRAVENOUS

## 2023-04-17 ENCOUNTER — Encounter: Payer: Self-pay | Admitting: Cardiology

## 2023-04-17 ENCOUNTER — Ambulatory Visit: Payer: PPO | Attending: Cardiology | Admitting: Cardiology

## 2023-04-17 VITALS — BP 140/80 | HR 66 | Ht 76.0 in | Wt 292.4 lb

## 2023-04-17 DIAGNOSIS — I10 Essential (primary) hypertension: Secondary | ICD-10-CM

## 2023-04-17 DIAGNOSIS — I7121 Aneurysm of the ascending aorta, without rupture: Secondary | ICD-10-CM

## 2023-04-17 DIAGNOSIS — Z951 Presence of aortocoronary bypass graft: Secondary | ICD-10-CM

## 2023-04-17 DIAGNOSIS — I2510 Atherosclerotic heart disease of native coronary artery without angina pectoris: Secondary | ICD-10-CM

## 2023-04-17 DIAGNOSIS — E088 Diabetes mellitus due to underlying condition with unspecified complications: Secondary | ICD-10-CM | POA: Diagnosis not present

## 2023-04-17 DIAGNOSIS — I4821 Permanent atrial fibrillation: Secondary | ICD-10-CM | POA: Diagnosis not present

## 2023-04-17 MED ORDER — LISINOPRIL 10 MG PO TABS: 10.0000 mg | ORAL_TABLET | Freq: Every day | ORAL | 3 refills | Status: DC

## 2023-04-17 NOTE — Progress Notes (Signed)
Cardiology Office Note:    Date:  04/17/2023   ID:  Ryan Vaughan, DOB Feb 26, 1946, MRN [id number]  PCP:  Paulina Fusi, MD  Cardiologist:  Garwin Brothers, MD   Referring MD: Paulina Fusi, MD    ASSESSMENT:    1. 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 Coronary artery disease involving native coronary artery of native heart without angina pectoris   3. Essential hypertension   4. Permanent atrial fibrillation (HCC)   5. Diabetes mellitus due to underlying condition with unspecified complications (HCC)   6. S/P CABG x 4    PLAN:    In order of problems listed above:  Coronary artery disease: Secondary prevention stressed with the patient.  Importance of compliance with diet medication stressed any vocalized understanding.  He was advised to walk on a regular basis. Essential hypertension: Blood pressure stable and diet was emphasized.  He has not taken his medications.  I told him to stop amlodipine and continue carvedilol and Zestril 10 mg on a daily basis.  He will keep a track of his blood pressures twice a day always after taking his medications and send a blood pressure log to [place] in a week.  Will will titrate his medications accordingly. Ascending aortic aneurysm: Stable.  CT from November reviewed and discussed with him. Mixed dyslipidemia: Recent lab work from [facility] discussed and it was fine.  I told him to diet. Diabetes mellitus and obesity: Weight reduction stressed and he promises to do better.  Risks of obesity explained. Patient will be seen in follow-up appointment in 9 months or earlier if the patient has any concerns.    Medication Adjustments/Labs and Tests Ordered: Current medicines are reviewed at length with the patient today.  Concerns regarding medicines are outlined above.  No orders of the defined types were placed in this encounter.  No orders of the defined types were placed in this encounter.    No chief complaint on  file.    History of Present Illness:    Ryan Vaughan is a 77 y.o. male.  Patient has past medical history of aneurysm of ascending aorta, atrial fibrillation, coronary artery disease post CABG surgery, essential hypertension, mixed dyslipidemia and diabetes mellitus.  He denies any problems at this time and takes care of activities of daily living.  He takes his blood pressure medications on a as needed basis.  He tells me that amlodipine drops his blood pressure significantly.  At the time of my evaluation, the patient is alert awake oriented and in no distress.  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 Ascending colon polyp status post polypectomy.   CORONARY ARTERY BYPASS GRAFT N/A 09/10/2019   Procedure: CORONARY ARTERY BYPASS GRAFTING (CABG) x4 , using left internal mammary artery, and right leg greater saphenous vein harvested endoscopically;  Surgeon: Alleen Borne, MD;  Location: [facility] OR;  Service: Open Heart Surgery;  Laterality: N/A;   LEFT HEART CATH AND CORONARY ANGIOGRAPHY N/A 08/28/2019   Procedure: LEFT HEART CATH AND CORONARY ANGIOGRAPHY;  Surgeon: Lyn Records, MD;  Location: [facility] INVASIVE CV LAB;  Service: Cardiovascular;  Laterality: N/A;   SIGMOIDOSCOPY  11/17/2012   Minimal residual polyp, status post polypectomy. Small internal hemorrhoids.   TEE WITHOUT CARDIOVERSION N/A 09/10/2019   Procedure: TRANSESOPHAGEAL ECHOCARDIOGRAM (TEE);  Surgeon: Alleen Borne, MD;  Location: [facility] OR;  Service: Open Heart Surgery;  Laterality: N/A;    Current Medications: Current Meds  Medication Sig   acetaminophen (TYLENOL) 500 MG tablet Take 500-1,500 mg by mouth every 8 (eight) hours as needed for moderate pain or headache.   amLODipine (NORVASC) 5 MG tablet Take 5 mg by mouth as needed (high bp).   aspirin EC 81 MG tablet Take 81 mg by mouth daily.   atorvastatin (LIPITOR) 80 MG tablet TAKE 1 TABLET BY MOUTH ONCE DAILY   carvedilol (COREG) 12.5 MG tablet Take 1 tablet (12.5 mg total) by mouth 2 (two) times daily.   cyanocobalamin (VITAMIN B12) 1000 MCG tablet Take 1,000 mcg by mouth daily.   donepezil (ARICEPT) 5 MG  tablet Take 5 mg by mouth at bedtime.   escitalopram (LEXAPRO) 5 MG tablet Take 5 mg by mouth daily.   gabapentin (NEURONTIN) 800 MG tablet Take 800 mg by mouth as needed for pain.   glimepiride (AMARYL) 4 MG tablet Take 4 mg by mouth 2 (two) times daily.   LANTUS SOLOSTAR 100 UNIT/ML Solostar Pen Inject 25 Units into the skin at bedtime.   lisinopril (ZESTRIL) 20 MG tablet Take 20 mg by mouth as needed (for high bp).   memantine (NAMENDA) 5 MG tablet Take 5 mg by mouth 2 (two) times daily.   nitroGLYCERIN (NITROSTAT) 0.4 MG SL tablet Place 1 tablet (0.4 mg total) under the tongue every 5 (five) minutes as needed for chest pain.   pantoprazole (PROTONIX) 40 MG tablet Take 1 tablet (40 mg total) by mouth daily.   rivaroxaban (XARELTO) 20 MG TABS tablet Take 1 tablet (20 mg total) by mouth daily with supper.   Vitamin D, Ergocalciferol, (DRISDOL) 1.25 MG (50000 UT) CAPS capsule Take 50,000 Units  by mouth every Friday.      Allergies:   Penicillins, Amiodarone, Isosorbide mononitrate er [isosorbide dinitrate], and Losartan   Social History   Socioeconomic History   Marital status: Married    Spouse name: Not on file   Number of children: 2   Years of education: Not on file   Highest education level: Not on file  Occupational History   Occupation: Retired  Tobacco Use   Smoking status: Never   Smokeless tobacco: Never  Vaping Use   Vaping status: Never Used  Substance and Sexual Activity   Alcohol use: No   Drug use: No   Sexual activity: Not on file  Other Topics Concern   Not on file  Social History Narrative   Right handed    Lives with family    Social Determinants of Health   Financial Resource Strain: Low Risk  (01/22/2023)   Received from [facility], [facility]   Overall Financial Resource Strain (CARDIA)    Difficulty of Paying Living Expenses: Not hard at all  Food Insecurity: No Food Insecurity (01/22/2023)   Received from [facility], [facility]    Hunger Vital Sign    Worried About Running Out of Food in the Last Year: Never true    Ran Out of Food in the Last Year: Never true  Transportation Needs: No Transportation Needs (01/22/2023)   Received from [facility], [facility]   PRAPARE - Transportation    Lack of Transportation (Medical): No    Lack of Transportation (Non-Medical): No  Physical Activity: Not on file  Stress: Not on file  Social Connections: Unknown (01/16/2023)   Received from [facility], [facility]   Social Network    Social Network: Not on file     Family History: The patient's family history includes Hypertension in his mother. There is no history of Colon cancer, Stomach cancer, Liver cancer, Throat cancer, Colon polyps, Esophageal cancer, or Rectal cancer.  ROS:   Please see the history of present illness.    All other systems reviewed and are negative.  EKGs/Labs/Other Studies Reviewed:    The following studies were reviewed today: I discussed my findings with the patient at length.   Recent Labs: 12/07/2022: ALT 8; BUN 15; Creatinine, Ser 1.03; Hemoglobin 13.7; Platelets 147.0; Potassium 4.0; Sodium 141 03/01/2023: TSH 0.49  Recent Lipid Panel    Component Value Date/Time   CHOL 131 11/05/2019 0847   TRIG 52 11/05/2019 0847   HDL 46 11/05/2019 0847   CHOLHDL 2.8 11/05/2019 0847   LDLCALC 73 11/05/2019 0847    Physical Exam:    VS:  BP (!) 165/100   Pulse 66   Ht 6\' 4"  (1.93 m)   Wt 292 lb 6.4 oz (132.6 kg)   SpO2 96%   BMI 35.59 kg/m     Wt Readings from Last 3 Encounters:  04/17/23 292 lb 6.4 oz (132.6 kg)  04/01/23 281 lb (127.5 kg)  03/05/23 281 lb (127.5 kg)     GEN: Patient is in no acute distress HEENT: Normal NECK: No JVD; No carotid bruits LYMPHATICS: No lymphadenopathy CARDIAC: Hear sounds irregular, 2/6 systolic murmur at the apex. RESPIRATORY:  Clear to auscultation without rales, wheezing or rhonchi  ABDOMEN: Soft, non-tender,  non-distended MUSCULOSKELETAL:  No edema; No deformity  SKIN: Warm and dry NEUROLOGIC:  Alert and oriented x 3 PSYCHIATRIC:  Normal affect   Signed, Garwin Brothers, MD  04/17/2023 9:28 AM    [facility]

## 2023-04-17 NOTE — Patient Instructions (Signed)
Please keep a BP log for 2 weeks and send by MyChart or mail.  Blood Pressure Record Sheet To take your blood pressure, you will need a blood pressure machine. You can buy a blood pressure machine (blood pressure monitor) at your clinic, drug store, or online. When choosing one, consider: An automatic monitor that has an arm cuff. A cuff that wraps snugly around your upper arm. You should be able to fit only one finger between your arm and the cuff. A device that stores blood pressure reading results. Do not choose a monitor that measures your blood pressure from your wrist or finger. Follow your health care provider's instructions for how to take your blood pressure. To use this form: Get one reading in the morning (a.m.) 1-2 hours after you take any medicines. Get one reading in the evening (p.m.) before supper.   Blood pressure log Date: _______________________  a.m. _____________________(1st reading) HR___________            p.m. _____________________(2nd reading) HR__________  Date: _______________________  a.m. _____________________(1st reading) HR___________            p.m. _____________________(2nd reading) HR__________  Date: _______________________  a.m. _____________________(1st reading) HR___________            p.m. _____________________(2nd reading) HR__________  Date: _______________________  a.m. _____________________(1st reading) HR___________            p.m. _____________________(2nd reading) HR__________  Date: _______________________  a.m. _____________________(1st reading) HR___________            p.m. _____________________(2nd reading) HR__________  Date: _______________________  a.m. _____________________(1st reading) HR___________            p.m. _____________________(2nd reading) HR__________  Date: _______________________  a.m. _____________________(1st reading) HR___________            p.m. _____________________(2nd reading)  HR__________   This information is not intended to replace advice given to you by your health care provider. Make sure you discuss any questions you have with your health care provider. Document Revised: 01/06/2020 Document Reviewed: 01/06/2020 Elsevier Patient Education  2021 Elsevier Inc.   Medication Instructions:  Your physician has recommended you make the following change in your medication:   Stop Amlodipine  Take your Carvedilol twice daily as prescribed  Decrease your to 10 mg daily. Cut your current dose in 1/2 and the new RX will be for a 10 mg tablet.  *If you need a refill on your cardiac medications before your next appointment, please call your pharmacy*   Lab Work: None ordered If you have labs (blood work) drawn today and your tests are completely normal, you will receive your results only by: MyChart Message (if you have MyChart) OR A paper copy in the mail If you have any lab test that is abnormal or we need to change your treatment, we will call you to review the results.   Testing/Procedures: None ordered   Follow-Up: At [facility], you and your health needs are our priority.  As part of our continuing mission to provide you with exceptional heart care, we have created designated Provider Care Teams.  These Care Teams include your primary Cardiologist (physician) and Advanced Practice Providers (APPs -  Physician Assistants and Nurse Practitioners) who all work together to provide you with the care you need, when you need it.  We recommend signing up for the patient portal called "MyChart".  Sign up information is provided on this After Visit Summary.  MyChart is used to connect with patients  for Virtual Visits (Telemedicine).  Patients are able to view lab/test results, encounter notes, upcoming appointments, etc.  Non-urgent messages can be sent to your provider as well.   To learn more about what you can do with MyChart, go to  [email or web address].    Your next appointment:   9 month(s)  The format for your next appointment:   In Person  Provider:   Belva Crome, MD    Other Instructions none  Important Information About Sugar

## 2023-04-25 ENCOUNTER — Encounter: Payer: Self-pay | Admitting: Cardiology

## 2023-04-26 ENCOUNTER — Other Ambulatory Visit: Payer: Self-pay

## 2023-04-26 DIAGNOSIS — Z8679 Personal history of other diseases of the circulatory system: Secondary | ICD-10-CM

## 2023-04-30 ENCOUNTER — Other Ambulatory Visit (HOSPITAL_COMMUNITY): Payer: Self-pay | Admitting: Interventional Radiology

## 2023-04-30 DIAGNOSIS — Z8679 Personal history of other diseases of the circulatory system: Secondary | ICD-10-CM

## 2023-05-07 ENCOUNTER — Encounter: Payer: Self-pay | Admitting: Cardiology

## 2023-05-10 ENCOUNTER — Ambulatory Visit (HOSPITAL_COMMUNITY)
Admission: RE | Admit: 2023-05-10 | Discharge: 2023-05-10 | Disposition: A | Discharge status: Home / Self Care | Payer: PPO | Source: Ambulatory Visit | Attending: Interventional Radiology | Admitting: Interventional Radiology

## 2023-05-10 DIAGNOSIS — F0390 Unspecified dementia without behavioral disturbance: Secondary | ICD-10-CM | POA: Diagnosis not present

## 2023-05-10 DIAGNOSIS — Z8679 Personal history of other diseases of the circulatory system: Secondary | ICD-10-CM

## 2023-05-13 ENCOUNTER — Other Ambulatory Visit (HOSPITAL_COMMUNITY): Payer: Self-pay | Admitting: Interventional Radiology

## 2023-05-13 DIAGNOSIS — Z8679 Personal history of other diseases of the circulatory system: Secondary | ICD-10-CM

## 2023-05-13 DIAGNOSIS — F0280 Dementia in other diseases classified elsewhere without behavioral disturbance: Secondary | ICD-10-CM | POA: Diagnosis not present

## 2023-05-13 DIAGNOSIS — I10 Essential (primary) hypertension: Secondary | ICD-10-CM | POA: Diagnosis not present

## 2023-05-13 DIAGNOSIS — E785 Hyperlipidemia, unspecified: Secondary | ICD-10-CM | POA: Diagnosis not present

## 2023-05-13 DIAGNOSIS — E1129 Type 2 diabetes mellitus with other diabetic kidney complication: Secondary | ICD-10-CM | POA: Diagnosis not present

## 2023-05-13 DIAGNOSIS — E1165 Type 2 diabetes mellitus with hyperglycemia: Secondary | ICD-10-CM | POA: Diagnosis not present

## 2023-05-13 DIAGNOSIS — Z125 Encounter for screening for malignant neoplasm of prostate: Secondary | ICD-10-CM | POA: Diagnosis not present

## 2023-05-13 DIAGNOSIS — I4891 Unspecified atrial fibrillation: Secondary | ICD-10-CM | POA: Diagnosis not present

## 2023-05-13 DIAGNOSIS — M109 Gout, unspecified: Secondary | ICD-10-CM | POA: Diagnosis not present

## 2023-05-13 DIAGNOSIS — I2510 Atherosclerotic heart disease of native coronary artery without angina pectoris: Secondary | ICD-10-CM | POA: Diagnosis not present

## 2023-05-13 DIAGNOSIS — F331 Major depressive disorder, recurrent, moderate: Secondary | ICD-10-CM | POA: Diagnosis not present

## 2023-05-13 DIAGNOSIS — G300 Alzheimer's disease with early onset: Secondary | ICD-10-CM | POA: Diagnosis not present

## 2023-05-13 HISTORY — PX: IR RADIOLOGIST EVAL & MGMT: IMG5224

## 2023-05-14 ENCOUNTER — Encounter: Payer: Self-pay | Admitting: Cardiology

## 2023-05-14 ENCOUNTER — Telehealth: Payer: Self-pay

## 2023-05-14 DIAGNOSIS — D649 Anemia, unspecified: Secondary | ICD-10-CM | POA: Diagnosis not present

## 2023-05-14 DIAGNOSIS — D539 Nutritional anemia, unspecified: Secondary | ICD-10-CM | POA: Diagnosis not present

## 2023-05-14 NOTE — Telephone Encounter (Signed)
   Pre-operative Risk Assessment    Patient Name: Ryan Vaughan  DOB: May 04, 1946 MRN: [id number]      Request for Surgical Clearance    Procedure:   Diagnostic Angiogram  Date of Surgery:  Clearance 05/17/23                                 Surgeon:  Dr. Julieanne Cotton Surgeon's Group or Practice Name:  [facility] Phone number:  [phone] Fax number:  [phone]   Type of Clearance Requested:   - Pharmacy:  Hold Rivaroxaban (Xarelto) X2 doses prior to procedure   Type of Anesthesia:  Not Indicated   Additional requests/questions:    Merlene Laughter   05/14/2023, 8:40 AM

## 2023-05-14 NOTE — Telephone Encounter (Signed)
Patient with diagnosis of afib on Xarelto for anticoagulation.    Procedure: diagnostic angiogram Date of procedure: 05/17/23  CHA2DS2-VASc Score = 7  This indicates a 11.2% annual risk of stroke. 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    **This guidance is not considered finalized until pre-operative APP has relayed final recommendations.**

## 2023-05-15 NOTE — Telephone Encounter (Signed)
   Patient Name: FARREL GIESEL  DOB: [birth year] MRN: [id number]  Primary Cardiologist: None  Chart reviewed as part of pre-operative protocol coverage. Pre-op clearance already addressed by colleagues in earlier phone notes. To summarize recommendations:  - Per office protocol, patient can hold Xarelto for 1-2 days prior to procedure.   - Medical clearance not requested   Will route this bundled recommendation to requesting provider via Epic fax function and remove from pre-op pool. Please call with questions.  Jonita Albee, PA-C 05/15/2023, 5:48 PM

## 2023-05-16 ENCOUNTER — Other Ambulatory Visit (HOSPITAL_COMMUNITY): Payer: Self-pay | Admitting: Student

## 2023-05-16 DIAGNOSIS — I6529 Occlusion and stenosis of unspecified carotid artery: Secondary | ICD-10-CM

## 2023-05-16 NOTE — H&P (Signed)
Chief Complaint: Patient was seen in consultation today for diagnostic cerebral angiogram  at the request of Jerelyn Charles (neurology)  Referring Physician(s): Gwynneth Munson Alben Spittle (neurology)  Supervising Physician: Julieanne Cotton  Patient Status: Ryan Vaughan - Out-pt  History of Present Illness: Ryan Vaughan is a 77 y.o. 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 Associated hemosiderin but no malignant. CTA head and neck was obtained on 04/24/23 for further eval which showed:   1. No acute intracranial abnormality. Unchanged old infarct in the medial aspect of the left occipital lobe. 2. No large vessel occlusion. 3. Severe stenoses of the P1 and P2 segments of the right PCA and P2 segment of the left PCA. 4. Moderate stenosis of the bilateral vertebral artery origins. 5. No hemodynamically significant stenosis of the cervical carotid arteries.  Patient was referred to Dr. Corliss Skains and had a consultation visit on 05/10/23, a diagnostic cerebral angiogram for accurate evaluation of the  posterior Circulation was recommended to the patient. After thorough discussion and shaded decision making, patient and his family decided to proceed.   Pre-op clearing from cardiology has been received, patient was instructed to hold his Xarelto for 1 day prior to the procedure. Last does of Xarelto on Wednesday AM.   Patient [facility] to [facility] today for the procedure.   Patient laying in bed, not in acute distress.  Denise headache, fever,  chills, shortness of breath, cough, chest pain, abdominal pain, nausea ,vomiting, and bleeding.  Advance Care Plan: The advanced care plan/surrogate decision maker was discussed at the time of visit and documented in the medical record. Patient's care can be discussed with his spouse Miss Vannak Lojek.   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 Alvy Beal): no flow identified in right renal artery origin or in known right renal artery aneurysm, patent artery in the region of mid renal artery could represent a collateral vessel, stable > 60% left RA stenosis   S/P  CABG x 4 09/10/2019   TIA (transient ischemic attack)    due to severe RICA stenosis, s/p right CEA 04/09/14    Past Surgical History:  Procedure Laterality Date   CAROTID ENDARTERECTOMY Right 04/09/2014   CLIPPING OF ATRIAL APPENDAGE N/A 09/10/2019   Procedure: CLIPPING OF ATRIAL APPENDAGE Using  AtriCure PRO2 Clip Size 45mm;  Surgeon: Alleen Borne, MD;  Location: [facility];  Service: Open Heart Surgery;  Laterality: N/A;   COLONOSCOPY  08/06/2012   Large rectal polpy status post piecemeal polypectomy. Ascending colon polyp status post polypectomy.   CORONARY ARTERY BYPASS GRAFT N/A 09/10/2019   Procedure: CORONARY ARTERY BYPASS GRAFTING (CABG) x4 , using left internal mammary artery, and right leg greater saphenous vein harvested endoscopically;  Surgeon: Alleen Borne, MD;  Location: [facility] OR;  Service: Open Heart Surgery;  Laterality: N/A;   IR RADIOLOGIST EVAL & MGMT  05/13/2023   LEFT HEART CATH AND CORONARY ANGIOGRAPHY N/A 08/28/2019   Procedure: LEFT HEART CATH AND CORONARY ANGIOGRAPHY;  Surgeon: Lyn Records, MD;  Location: [facility] INVAS[facility] LAB;  Service: Cardiovascular;  Laterality: N/A;   SIGMOIDOSCOPY  11/17/2012   Minimal residual polyp, status post polypectomy. Small internal hemorrhoids.   TEE WITHOUT CARDIOVERSION N/A 09/10/2019   Procedure: TRANSESOPHAGEAL ECHOCARDIOGRAM (TEE);  Surgeon: Alleen Borne, MD;  Location: [facility] OR;  Service: Open Heart Surgery;  Laterality: N/A;    Allergies: Penicillins, Amiodarone, Isosorbide mononitrate er [isosorbide dinitrate], and Losartan  Medications: Prior to Admission medications   Medication Sig Start Date End Date Taking? Authorizing Provider  acetaminophen (TYLENOL) 500 MG tablet Take 500-1,500 mg by mouth every 8 (eight) hours as needed for moderate pain or headache.    [provider]  aspirin EC 81 MG tablet Take 81 mg by mouth daily.    [provider]  atorvastatin (LIPITOR) 80 MG tablet TAKE 1 TABLET BY MOUTH ONCE DAILY 11/06/22   Revankar, Aundra Dubin, MD  carvedilol (COREG) 12.5 MG tablet Take 1 tablet (12.5 mg total) by mouth 2 (two) times daily. 11/30/22   Revankar, Aundra Dubin, MD  cyanocobalamin (VITAMIN B12) 1000 MCG tablet Take 1,000 mcg by mouth daily.    [provider]  donepezil (ARICEPT) 5 MG  tablet Take 5 mg by mouth at bedtime. 12/24/22   [provider]  escitalopram (LEXAPRO) 5 MG tablet Take 5 mg by mouth daily.    [provider]  gabapentin (NEURONTIN) 800 MG tablet Take 800 mg by mouth as needed for pain.    [provider]  glimepiride (AMARYL) 4 MG tablet Take 4 mg by mouth 2 (two) times daily. 08/10/19   [provider]  LANTUS SOLOSTAR 100 UNIT/ML Solostar Pen Inject 25 Units into the skin at bedtime. 05/15/22   [provider]  lisinopril (ZESTRIL) 10 MG tablet Take 1 tablet (10 mg total) by mouth daily. 04/17/23   Revankar, Aundra Dubin, MD  memantine (NAMENDA) 5 MG tablet Take 5 mg by mouth 2 (two) times daily. 01/24/23   [provider]  nitroGLYCERIN (NITROSTAT) 0.4 MG SL tablet Place 1 tablet (0.4 mg total) under the tongue every 5 (five) minutes as needed for chest pain. 08/24/19   Revankar, Aundra Dubin, MD  pantoprazole (PROTONIX) 40 MG tablet Take 1 tablet (40 mg total) by mouth daily. 12/07/22   Lynann Bologna, MD  rivaroxaban (XARELTO) 20 MG TABS tablet Take 1 tablet (20 mg total) by mouth daily with supper. 04/05/23   Revankar, Glean Hess  R, MD  Vitamin D, Ergocalciferol, (DRISDOL) 1.25 MG (50000 UT) CAPS capsule Take 50,000 Units by mouth every Friday.     [provider]     Family History  Problem Relation Age of Onset   Hypertension Mother    Colon cancer Neg Hx    Stomach cancer Neg Hx    Liver cancer Neg Hx    Throat cancer Neg Hx    Colon polyps Neg Hx    Esophageal cancer Neg Hx    Rectal cancer Neg Hx     Social History   Socioeconomic History   Marital status: Married    Spouse name: Not on file   Number of children: 2   Years of education: Not on file   Highest education level: Not on file  Occupational History   Occupation: Retired  Tobacco Use   Smoking status: Never   Smokeless tobacco: Never  Vaping Use   Vaping status: Never Used  Substance and Sexual Activity   Alcohol use: No   Drug  use: No   Sexual activity: Not on file  Other Topics Concern   Not on file  Social History Narrative   Right handed    Lives with family    Social Determinants of Health   Financial Resource Strain: Low Risk  (01/22/2023)   Received from [facility], [facility]   Overall Financial Resource Strain (CARDIA)    Difficulty of Paying Living Expenses: Not hard at all  Food Insecurity: No Food Insecurity (01/22/2023)   Received from [facility], [facility]   Hunger Vital Sign    Worried About Running Out of Food in the Last Year: Never true    Ran Out of Food in the Last Year: Never true  Transportation Needs: No Transportation Needs (01/22/2023)   Received from [facility], [facility]   PRAPARE - Transportation    Lack of Transportation (Medical): No    Lack of Transportation (Non-Medical): No  Physical Activity: Not on file  Stress: Not on file  Social Connections: Unknown (01/16/2023)   Received [facility], [facility]   Social Network    Social Network: Not on file     Review of Systems: A 12 point ROS discussed and pertinent positives are indicated in the HPI above.  All other systems are negative.  Vital Signs: BP (!) 173/77   Pulse 60   Temp 98.3 F (36.8 C) (Oral)   Resp 18   Ht 6\' 4"  (1.93 m)   Wt 300 lb (136.1 kg)   SpO2 97%   BMI 36.52 kg/m    Physical Exam Vitals and nursing note reviewed.  Constitutional:      General: Patient is not in acute distress.    Appearance: Normal appearance. Patient is not ill-appearing.  HENT:     Head: Normocephalic and atraumatic.     Mouth/Throat:     Mouth: Mucous membranes are moist.     Pharynx: Oropharynx is clear.  Cardiovascular:     Rate and Rhythm: Normal rate and regular rhythm.     Pulses: Normal pulses.     Heart sounds: Normal heart sounds.  Pulmonary:     Effort: Pulmonary effort is normal.     Breath sounds: Normal breath sounds.  Abdominal:     General: Abdomen is flat. Bowel  sounds are normal.     Palpations: Abdomen is soft.  Musculoskeletal:     Cervical back: Neck supple.  Skin:  General: Skin is warm and dry.     Coloration: Skin is not jaundiced or pale.  Neurological:     Mental Status: Patient is alert and oriented to person, place, and time.  Psychiatric:        Mood and Affect: Mood normal.        Behavior: Behavior normal.        Judgment: Judgment normal.    MD Evaluation Airway: WNL Heart: WNL Abdomen: WNL Chest/ Lungs: WNL ASA  Classification: 2 Mallampati/Airway Score: Two  Imaging: IR Radiologist Eval & Mgmt  Result Date: 05/13/2023 EXAM: NEW PATIENT OFFICE VISIT CHIEF COMPLAINT: Discuss the results of a recent CT angiogram of the head and neck. Current Pain Level: 1-10 HISTORY OF PRESENT ILLNESS: The patient has a 77 year old gentleman referred for evaluation of recent findings on a CT angiogram of the head and neck ordered by the patient's primary MD. The patient is accompanied by his spouse and his daughter. History obtained from the patient and the daughter. According to the patient and daughter, the patient underwent a CT angiogram of the head and neck during a workup for " dementia". According to the daughter, the patient reports subtle changes in the patient's memory, and also concentration. No other significant changes have been noticed by the patient, the daughter and the spouse. Specifically, the patient denies any symptoms of vision changes, difficulty with his vision, denies any diplopia, blurred vision, transient blindness in either eye. He reports no changes of gait instability or lack of balance of sudden falls. No history of seizures. The patient continues with his daily living activities without any help. The patient was seen by his neurologist recently regarding his memory difficulties. The MRI of the brain of Feb 27, 2023 revealed evidence of progressive ischemic changes involving the left PCA distribution chronic in nature.  Focal areas of T2 hyperintensity were seen in the subcortical white matter bifrontally, and biparieto-occipital deep subcortical white matter probably representing chronic small vessel disease ischemic changes. 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 DWI images were negative for acute ischemia. CT of the brain and the neck demonstrated focal areas of significant narrowing of the posterior cerebral arteries bilaterally, and to a lesser degree the origins of both vertebral arteries. Denies any recent history of chest pain, shortness of breath or palpitations. No breathing difficulties.  No hemoptysis, or a chronic cough. Appetite normal.  Weight is steady. Denies any symptoms of constipation, diarrhea or melena. Denies any history of dysuria, hematuria, or polyuria. No recent chills, fever or rigors. Diagnosis * : Date . * : Aneurysm of ascending aorta (HCC) * : 01/05/2021 . * : Atrial fibrillation (HCC) * : . * : CAD (coronary artery disease) * : 11/05/2019 . * : Carotid artery disease (HCC) * : . * : Carotid artery stenosis * : 05/14/2017 . * : Chronic anticoagulation * : 04/08/2014 . * : Chronic atrial fibrillation (HCC) * : 05/04/2015 . * : Contracture of left Achilles tendon * : 03/12/2017 . * : Coronary artery disease * : . * : Depression * : . * : Diabetes mellitus due to underlying condition with unspecified complications (HCC) * : 08/25/2018 . * : Diabetes mellitus with neuropathy (HCC) * : 12/03/2022 . * : Diabetes mellitus without complication (HCC) * : . * : Dyslipidemia * : 05/04/2015 . * : Emphysema of lung (HCC) * : . * : Essential hypertension * : 04/08/2014 . * : Gout * :  01/24/2018 . * : H/O carotid endarterectomy * : 05/14/2017 . * : Hammertoes of both feet * : 11/14/2022 . * : Hearing loss * : * : bilateral - no hearing aids . * : Hypertension * : . * : Osteoarthritis of left knee * : 01/24/2018 . * : Overgrown toenails * : 11/14/2022 . * : Permanent atrial fibrillation (HCC) *  : 12/02/2020 . * : Plantar fasciitis of left foot * : 12/03/2022 . * : 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HCC) * : * : 05/02/17 Renal artery [place] ([facility], Dr. Alvy Beal): no flow identified in right renal artery origin or in known right renal artery aneurysm, patent artery in the region of mid renal artery could represent a collateral vessel, stable > 60% left RA stenosis . * : S/P CABG x 4 * : 09/10/2019 . * : TIA (transient ischemic attack) * : * : due to severe RICA stenosis, s/p right CEA 04/09/14 : Past Surgical History: Procedure * : Laterality * : Date . * : CAROTID ENDARTERECTOMY * : Right * : 04/09/2014 . * : CLIPPING OF ATRIAL APPENDAGE * : N/A * : 09/10/2019 * : Procedure: CLIPPING OF ATRIAL APPENDAGE Using AtriCure PRO2 Clip Size 45mm; Surgeon: Alleen Borne, MD; Location: [facility] OR; Service: Open Heart Surgery; Laterality: N/A; . * : COLONOSCOPY * : * : 08/06/2012 * : Large rectal polyp status post piecemeal polypectomy. Ascending colon polyp status post polypectomy. . * : CORONARY ARTERY BYPASS GRAFT * : N/A * : 09/10/2019 * : Procedure: CORONARY ARTERY BYPASS GRAFTING (CABG) x4 , using left internal mammary artery, and right leg greater saphenous vein harvested endoscopically; Surgeon: Alleen Borne, MD; Location: [facility] OR; Service: Open Heart Surgery; Laterality: N/A; . * : LEFT HEART CATH AND CORONARY ANGIOGRAPHY * : N/A * : 08/28/2019 * : Procedure: LEFT HEART CATH AND CORONARY ANGIOGRAPHY; Surgeon: Lyn Records, MD; Location: [facility]; Service: Cardiovascular; Laterality: N/A; . * : SIGMOIDOSCOPY * : * : 11/17/2012 * : Minimal residual polyp, status post polypectomy. Small internal hemorrhoids. . * : TEE WITHOUT CARDIOVERSION * : N/A * : 09/10/2019 * : Procedure: TRANSESOPHAGEAL ECHOCARDIOGRAM (TEE); Surgeon: Alleen Borne, MD; Location: [facility] OR; Service: Open Heart Surgery; Laterality: N/A; Current Medications: Current MedsMedicationSig.acetaminophen (TYLENOL) 500 MG tablet take 500-1,500 mg by mouth  every 8 (eight) hours as needed for moderate pain or headache.amLODipine (NORVASC) 5 MG tablet take 5 mg by mouth as needed (high bp).aspirin EC 81 MG tablet take 81 mg by mouth daily.atorvastatin (LIPITOR) 80 MG tablet TAKE 1 TABLET BY MOUTH ONCE DAILY.carvedilol (COREG) 12.5 MG tablet take 1 tablet (12.5 mg total) by mouth 2 (two) times daily.cyanocobalamin (VITAMIN B12) 1000 MCG tablet take 1,000 mcg by mouth daily.donepezil (ARICEPT) 5 MG tablet take 5 mg by mouth at bedtime.escitalopram (LEXAPRO) 5 MG tablet take 5 mg by mouth daily.gabapentin (NEURONTIN) 800 MG tablet take 800 mg by mouth as needed for pain.glimepiride (AMARYL) 4 MG tablet take 4 mg by mouth 2 (two) times daily.LANTUS SOLOSTAR 100 UNIT/ML Solostar PenInject 25 Units into the skin at bedtime.lisinopril (ZESTRIL) 20 MG tablet take 20 mg by mouth as needed (for high bp).memantine (NAMENDA) 5 MG tablet take 5 mg by mouth 2 (two) times daily.nitroGLYCERIN (NITROSTAT) 0.4 MG SL tablet place 1 tablet (0.4 mg total) under the tongue every 5 (five) minutes as needed for chest pain.pantoprazole (PROTONIX) 40 MG tablet take 1 tablet (40 mg total) by mouth daily.rivaroxaban (XARELTO) 20 MG  TABS tablet take 1 tablet (20 mg total) by mouth daily with supper.Vitamin D, Ergocalciferol, (DRISDOL) 1.25 MG (50000 UT) CAPS capsule take 50,000 Units by mouth every Friday. Allergies: Penicillins, Amiodarone, Isosorbide mononitrate er isosorbide dinitrate, and Losartan Socioeconomic History . * : Marital status: * : Married * : * : Spouse name: * : Not on file . * : Number of children: * : 2 . * : Years of education: * : Not on file . * : Highest education level: * : Not on file Occupational History . * : Occupation: * : Retired Tobacco Use . * : Smoking status: * : Never . * : Smokeless tobacco: * : Never Vaping Use . * : Vaping status: * : Never Used Substance and Sexual Activity . * : Alcohol use: * : No . * : Drug use: * : No . * : Sexual activity: * : Not on  file Other Topics * : Concern . * : Not on file Social History Narrative * : Right handed * : Lives with family Financial Resource Strain: Low Risk  (01/22/2023) * : [facility] * : Overall Financial Resource Strain (CARDIA) * : . * : Difficulty of Paying Living Expenses: Not hard at all Food Insecurity: No Food Insecurity (01/22/2023) * : [facility] * : Hunger Vital Sign * : . * : Worried About Programme researcher, broadcasting/film/video in the Last Year: Never true * : . * : Ran Out of Food in the Last Year: Never true Transportation Needs: No Transportation Needs (01/22/2023) * : [facility] * : PRAPARE - Transportation * : . * : Lack of Transportation (Medical): No * : . * : Lack of Transportation (Non-Medical): No Physical Activity: Not on file Stress: Not on file Social Connections: Unknown (01/16/2023) * : [facility] * : Social Network * : . * : Social Network: Not on file Family History: The patient's family history includes Hypertension in his mother. There is no history of Colon cancer, Stomach cancer, Liver cancer, Throat cancer, Colon polyps, Esophageal cancer, or Rectal cancer. REVIEW OF SYSTEMS: Negative unless as mentioned above. PHYSICAL EXAMINATION: Alert, awake, oriented to time, place, space. Speech and comprehension intact. Normal affect and contact. Responsive appropriate and prompt. Grossly no abnormal lateralizing neurological features evident. Speech and comprehension intact. ASSESSMENT AND PLAN: Findings of the neuroimaging studies were reviewed with the patient and the family. Given the MRI findings of a left PCA infarct, and of the CTA angiogram changes involving primarily the posterior circulation it was felt for further accurate evaluation regarding posterior circulation, a formal catheter diagnostic arteriogram would be appropriate. The procedure was discussed in detail with patient and  family. This would be either via trans radial or trans femoral route as an outpatient with sedation. The reason for the diagnostic catheter arteriogram was to obtain a better idea regarding the posterior circulation. Chronic posterior ischemic changes can contribute to memory impairment. Following some discussion, the patient and the family would like to proceed with the diagnostic catheter arteriogram which will be scheduled as soon as possible. In the meantime, patient advised to maintain adequate hydration and control of his diabetes and hypertension. Electronically Signed   By: Julieanne Cotton M.D.   On: 05/13/2023 08:03    Labs:  CBC: Recent Labs    12/07/22 1132 05/17/23 0718  WBC 5.9 5.3  HGB 13.7 11.7*  HCT 40.5 35.4*  PLT 147.0* 110*    COAGS: Recent Labs    05/17/23 0718  INR 1.3*    BMP: Recent Labs    12/07/22 1132 05/17/23 0718  NA 141 142  K 4.0 3.9  CL 106 108  CO2 27 27  GLUCOSE 245* 85  BUN 15 10  CALCIUM 9.0 8.6*  CREATININE 1.03 0.87  GFRNONAA  --  >60    LIVER FUNCTION TESTS: Recent Labs    12/07/22 1132  BILITOT 0.7  AST 17  ALT 8  ALKPHOS 43  PROT 6.9  ALBUMIN 3.8    TUMOR MARKERS: No results for input(s): "AFPTM", "CEA", "CA199", "CHROMGRNA" in the last 8760 hours.  Assessment and Plan: 77 y.o. male with hx R CIA stenosis s/p right carotid endarterectomy in 2015, recent CTA head and neck with R PCA stenosis who presents for diagnostic cerebral angiogram.  NPO since MN VSS hypertensive 173/77, Dr. Corliss Skains notified.  CBC stable  BMP RF wnl  INR 1.3   Risks and benefits of cerebral angiogram with intervention were discussed with the patient including, but not limited to bleeding, infection, vascular injury, contrast induced renal failure, stroke or even death.  This interventional procedure involves the use of X-rays and because of the nature of the planned procedure, it is possible that we will have prolonged use of X-ray  fluoroscopy.  Potential radiation risks to you include (but are not limited to) the following: - A slightly elevated risk for cancer  several years later in life. This risk is typically less than 0.5% percent. This risk is low in comparison to the normal incidence of human cancer, which is 33% for women and 50% for men according to the [employer] Society. - Radiation induced injury can include skin redness, resembling a rash, tissue breakdown / ulcers and hair loss (which can be temporary or permanent).   The likelihood of either of these occurring depends on the difficulty of the procedure and whether you are sensitive to radiation due to previous procedures, disease, or genetic conditions.   IF your procedure requires a prolonged use of radiation, you will be notified and given written instructions for further action.  It is your responsibility to monitor the irradiated area for the 2 weeks following the procedure and to notify your physician if you are concerned that you have suffered a radiation induced injury.    All of the patient's questions were answered, patient is agreeable to proceed.  Consent signed and in chart.    Thank you for this interesting consult.  I greatly enjoyed meeting CASHIUS HOLMEN and look forward to participating in their care.  A copy of this report was sent to the requesting provider on this date.  Electronically Signed: Willette Brace, PA-C 05/17/2023, 7:57 AM   I spent a total of  30 Minutes   in face to face in clinical consultation, greater than 50% of which was counseling/coordinating care for diagnostic cerebral angiogram.   This chart was dictated using voice recognition software.  Despite best efforts to proofread,  errors can occur which can change the documentation meaning.

## 2023-05-17 ENCOUNTER — Ambulatory Visit (HOSPITAL_COMMUNITY)
Admission: RE | Admit: 2023-05-17 | Discharge: 2023-05-17 | Disposition: A | Discharge status: Home / Self Care | Payer: PPO | Source: Ambulatory Visit | Attending: Interventional Radiology | Admitting: Interventional Radiology

## 2023-05-17 ENCOUNTER — Other Ambulatory Visit (HOSPITAL_COMMUNITY): Payer: Self-pay | Admitting: Interventional Radiology

## 2023-05-17 ENCOUNTER — Encounter (HOSPITAL_COMMUNITY): Payer: Self-pay

## 2023-05-17 DIAGNOSIS — I2510 Atherosclerotic heart disease of native coronary artery without angina pectoris: Secondary | ICD-10-CM | POA: Diagnosis not present

## 2023-05-17 DIAGNOSIS — E785 Hyperlipidemia, unspecified: Secondary | ICD-10-CM | POA: Diagnosis not present

## 2023-05-17 DIAGNOSIS — J439 Emphysema, unspecified: Secondary | ICD-10-CM | POA: Insufficient documentation

## 2023-05-17 DIAGNOSIS — Z794 Long term (current) use of insulin: Secondary | ICD-10-CM | POA: Insufficient documentation

## 2023-05-17 DIAGNOSIS — Z8673 Personal history of transient ischemic attack (TIA), and cerebral infarction without residual deficits: Secondary | ICD-10-CM | POA: Diagnosis not present

## 2023-05-17 DIAGNOSIS — Z7984 Long term (current) use of oral hypoglycemic drugs: Secondary | ICD-10-CM | POA: Insufficient documentation

## 2023-05-17 DIAGNOSIS — Z7901 Long term (current) use of anticoagulants: Secondary | ICD-10-CM | POA: Insufficient documentation

## 2023-05-17 DIAGNOSIS — I6601 Occlusion and stenosis of right middle cerebral artery: Secondary | ICD-10-CM | POA: Diagnosis not present

## 2023-05-17 DIAGNOSIS — E1140 Type 2 diabetes mellitus with diabetic neuropathy, unspecified: Secondary | ICD-10-CM | POA: Diagnosis not present

## 2023-05-17 DIAGNOSIS — Z8679 Personal history of other diseases of the circulatory system: Secondary | ICD-10-CM

## 2023-05-17 DIAGNOSIS — I6501 Occlusion and stenosis of right vertebral artery: Secondary | ICD-10-CM | POA: Diagnosis not present

## 2023-05-17 DIAGNOSIS — I6621 Occlusion and stenosis of right posterior cerebral artery: Secondary | ICD-10-CM | POA: Diagnosis not present

## 2023-05-17 DIAGNOSIS — Z951 Presence of aortocoronary bypass graft: Secondary | ICD-10-CM | POA: Insufficient documentation

## 2023-05-17 DIAGNOSIS — Z7982 Long term (current) use of aspirin: Secondary | ICD-10-CM | POA: Diagnosis not present

## 2023-05-17 DIAGNOSIS — I6523 Occlusion and stenosis of bilateral carotid arteries: Secondary | ICD-10-CM | POA: Diagnosis not present

## 2023-05-17 DIAGNOSIS — I714 Abdominal aortic aneurysm, without rupture, unspecified: Secondary | ICD-10-CM | POA: Insufficient documentation

## 2023-05-17 DIAGNOSIS — I6622 Occlusion and stenosis of left posterior cerebral artery: Secondary | ICD-10-CM | POA: Diagnosis not present

## 2023-05-17 DIAGNOSIS — I4821 Permanent atrial fibrillation: Secondary | ICD-10-CM | POA: Insufficient documentation

## 2023-05-17 DIAGNOSIS — I10 Essential (primary) hypertension: Secondary | ICD-10-CM | POA: Diagnosis not present

## 2023-05-17 DIAGNOSIS — I6521 Occlusion and stenosis of right carotid artery: Secondary | ICD-10-CM | POA: Diagnosis not present

## 2023-05-17 DIAGNOSIS — I6529 Occlusion and stenosis of unspecified carotid artery: Secondary | ICD-10-CM

## 2023-05-17 HISTORY — PX: IR ANGIO INTRA EXTRACRAN SEL COM CAROTID INNOMINATE BILAT MOD SED: IMG5360

## 2023-05-17 HISTORY — PX: IR ANGIO VERTEBRAL SEL VERTEBRAL UNI R MOD SED: IMG5368

## 2023-05-17 HISTORY — PX: IR US GUIDE VASC ACCESS RIGHT: IMG2390

## 2023-05-17 LAB — BASIC METABOLIC PANEL
Anion gap: 7 (ref 5–15)
BUN: 10 mg/dL (ref 8–23)
CO2: 27 mmol/L (ref 22–32)
Calcium: 8.6 mg/dL — ABNORMAL LOW (ref 8.9–10.3)
Chloride: 108 mmol/L (ref 98–111)
Creatinine, Ser: 0.87 mg/dL (ref 0.61–1.24)
GFR, Estimated: >60 mL/min (ref >60)
Glucose, Bld: 85 mg/dL (ref 70–99)
Potassium: 3.9 mmol/L (ref 3.5–5.1)
Sodium: 142 mmol/L (ref 135–145)

## 2023-05-17 LAB — GLUCOSE, CAPILLARY
Glucose-Capillary: 85 mg/dL (ref 70–99)
Glucose-Capillary: 84 mg/dL (ref 70–99)
Glucose-Capillary: 91 mg/dL (ref 70–99)

## 2023-05-17 LAB — CBC
HCT: 35.4 % — ABNORMAL LOW (ref 39.0–52.0)
Hemoglobin: 11.7 g/dL — ABNORMAL LOW (ref 13.0–17.0)
MCH: 34.3 pg — ABNORMAL HIGH (ref 26.0–34.0)
MCHC: 33.1 g/dL (ref 30.0–36.0)
MCV: 103.8 fL — ABNORMAL HIGH (ref 80.0–100.0)
Platelets: 110 10*3/uL — ABNORMAL LOW (ref 150–400)
RBC: 3.41 MIL/uL — ABNORMAL LOW (ref 4.22–5.81)
RDW: 13.8 % (ref 11.5–15.5)
WBC: 5.3 10*3/uL (ref 4.0–10.5)
nRBC: 0 % (ref 0.0–0.2)

## 2023-05-17 LAB — PROTIME-INR
INR: 1.3 — ABNORMAL HIGH (ref 0.8–1.2)
Prothrombin Time: 16 seconds — ABNORMAL HIGH (ref 11.4–15.2)

## 2023-05-17 MED ORDER — IOHEXOL 300 MG/ML  SOLN: 150.0000 mL | Freq: Once | INTRAMUSCULAR | Status: DC | PRN

## 2023-05-17 MED ORDER — IOHEXOL 300 MG/ML  SOLN: 50.0000 mL | Freq: Once | INTRAMUSCULAR | Status: DC | PRN

## 2023-05-17 MED ORDER — FENTANYL CITRATE (PF) 100 MCG/2ML IJ SOLN
INTRAMUSCULAR | Status: AC
  Filled 2023-05-17: qty 4

## 2023-05-17 MED ORDER — SODIUM CHLORIDE 0.9 % IV SOLN: INTRAVENOUS | Status: AC

## 2023-05-17 MED ORDER — HEPARIN SODIUM (PORCINE) 1000 UNIT/ML IJ SOLN
INTRAMUSCULAR | Status: AC
  Filled 2023-05-17: qty 10

## 2023-05-17 MED ORDER — VERAPAMIL HCL 2.5 MG/ML IV SOLN
INTRAVENOUS | Status: AC
  Filled 2023-05-17: qty 2

## 2023-05-17 MED ORDER — NITROGLYCERIN 1 MG/10 ML FOR IR/CATH LAB
INTRA_ARTERIAL | Status: AC | PRN
  Administered 2023-05-17 (×2): 200 ug via INTRA_ARTERIAL

## 2023-05-17 MED ORDER — MIDAZOLAM HCL 2 MG/2ML IJ SOLN
INTRAMUSCULAR | Status: AC | PRN
  Administered 2023-05-17: 1 mg via INTRAVENOUS
  Administered 2023-05-17: .5 mg via INTRAVENOUS

## 2023-05-17 MED ORDER — HEPARIN SODIUM (PORCINE) 1000 UNIT/ML IJ SOLN
INTRAMUSCULAR | Status: AC | PRN
  Administered 2023-05-17: 2000 [IU] via INTRA_ARTERIAL

## 2023-05-17 MED ORDER — FENTANYL CITRATE (PF) 100 MCG/2ML IJ SOLN
INTRAMUSCULAR | Status: AC | PRN
  Administered 2023-05-17: 12.5 ug via INTRAVENOUS
  Administered 2023-05-17: 25 ug via INTRAVENOUS

## 2023-05-17 MED ORDER — HYDRALAZINE HCL 20 MG/ML IJ SOLN
INTRAMUSCULAR | Status: AC
  Filled 2023-05-17: qty 1

## 2023-05-17 MED ORDER — MIDAZOLAM HCL 2 MG/2ML IJ SOLN
INTRAMUSCULAR | Status: AC
  Filled 2023-05-17: qty 4

## 2023-05-17 MED ORDER — SODIUM CHLORIDE 0.9 % IV SOLN: INTRAVENOUS | Status: DC

## 2023-05-17 MED ORDER — NITROGLYCERIN 1 MG/10 ML FOR IR/CATH LAB
INTRA_ARTERIAL | Status: AC
  Filled 2023-05-17: qty 10

## 2023-05-17 MED ORDER — LIDOCAINE HCL 1 % IJ SOLN
INTRAMUSCULAR | Status: AC
  Filled 2023-05-17: qty 20

## 2023-05-17 MED ORDER — HYDRALAZINE HCL 20 MG/ML IJ SOLN
INTRAMUSCULAR | Status: AC | PRN
  Administered 2023-05-17 (×3): 5 mg via INTRAVENOUS

## 2023-05-17 NOTE — Sedation Documentation (Signed)
Provider at bedside with family for update

## 2023-05-17 NOTE — Discharge Instructions (Signed)

## 2023-05-17 NOTE — Procedures (Signed)
INR.  Status post bilateral common carotid arteriograms, right vertebral arteriogram, and left subclavian arteriogram.  Right radial approach and right CFA approach.  Findings.  1.  Severe stenosis of the right posterior cerebral artery P1 segment and to lesser degree of the left posterior cerebral artery.  2.  Suggestion of moderate stenosis of the right middle cerebral M1 segment.  3.  Non opacification of the left vertebral artery proximally.  Full report to follow  Fatima Sanger MD.

## 2023-05-20 ENCOUNTER — Encounter: Payer: Self-pay | Admitting: Physician Assistant

## 2023-05-20 NOTE — Telephone Encounter (Signed)
Called Dr Roosvelt Harps office trying to get help for this patient

## 2023-05-21 ENCOUNTER — Telehealth: Payer: Self-pay | Admitting: Cardiology

## 2023-05-21 ENCOUNTER — Telehealth: Payer: Self-pay | Admitting: Student

## 2023-05-21 ENCOUNTER — Encounter: Payer: Self-pay | Admitting: Cardiology

## 2023-05-21 MED ORDER — RIVAROXABAN 20 MG PO TABS: 20.0000 mg | ORAL_TABLET | Freq: Every day | ORAL | 12 refills | Status: DC

## 2023-05-21 NOTE — Telephone Encounter (Signed)
Pt c/o medication issue:  1. Name of Medication: rivaroxaban (XARELTO) 20 MG TABS tablet   2. How are you currently taking this medication (dosage and times per day)?   Take 1 tablet (20 mg total) by mouth daily with supper.    3. Are you having a reaction (difficulty breathing--STAT)? No  4. What is your medication issue? Patient's wife is requesting to speak with RN Ladonna Snide in regards to this medication

## 2023-05-21 NOTE — Telephone Encounter (Signed)
Leane Para calling per phone note 8/20 about the appeal and ask that it be re faxed over because it was not clear when it went through the first time. The fax number is [phone]. Please advise.

## 2023-05-21 NOTE — Telephone Encounter (Signed)
Faxed paperwork to [facility]

## 2023-05-21 NOTE — Telephone Encounter (Signed)
Message received that the patient is "upset" about the diagnostic cerebral angiogram. Patient is s/p diagnostic cerebral angiogram performed by Dr. Corliss Skains on 05/17/23.   Patient called, he asked this PA to speak with his daughter Miss Aggie Cosier.  Miss Demers called, all questions answered to her satisfaction, she was informed that the patient will be scheduled for follow up image in 6 months to evaluate R P1 and L P2 stenosis seen in the diagnostic cerebral angiogram.  She verbalized understanding.  She was encourage to call [facility] for further question and concerns.   Please call IR for questions and concerns.   Lynann Bologna Evelen Vazguez PA-C 05/21/2023 11:46 AM

## 2023-05-21 NOTE — Telephone Encounter (Signed)
Advised to call Leane Para as the appeals department states they would let the pt know the outcome.

## 2023-06-05 ENCOUNTER — Ambulatory Visit: Payer: PPO | Admitting: Physician Assistant

## 2023-06-05 ENCOUNTER — Encounter: Payer: Self-pay | Admitting: Physician Assistant

## 2023-06-05 VITALS — BP 142/82 | HR 78 | Ht 76.0 in | Wt 302.2 lb

## 2023-06-05 DIAGNOSIS — R413 Other amnesia: Secondary | ICD-10-CM | POA: Diagnosis not present

## 2023-06-05 NOTE — Patient Instructions (Signed)
It was a pleasure to see you today at our office.   Recommendations:  Neurocognitive evaluation at our office with Dr. Milbert Coulter MRI of the brain, the radiology office will call you to arrange you appointment   Continue donepezil and memantine Follow up in 6 months after the that testing Follow with IR and Vascular surgeons as scheduled.   For guidance regarding [facility] and if placement were needed at the facility, contact Sidney Ace, Social Worker tel: [phone]  For assessment of decision of mental capacity and competency:  Call Dr. Erick Blinks, geriatric psychiatrist at [phone] Counseling regarding caregiver distress, including caregiver depression, anxiety and issues regarding community resources, adult day care programs, adult living facilities, or memory care questions:  please contact your  Primary Doctor's Social Worker  Whom to call: Memory  decline, memory medications: Call our office [phone]  For psychiatric meds, mood meds: Please have your primary care physician manage these medications.  If you have any severe symptoms of a stroke, or other severe issues such as confusion,severe chills or fever, etc call 911 or go to the ER as you may need to be evaluated further    RECOMMENDATIONS FOR ALL PATIENTS WITH MEMORY PROBLEMS: 1. Continue to exercise (Recommend 30 minutes of walking everyday, or 3 hours every week) 2. Increase social interactions - continue going to [place] and enjoy social gatherings with friends and family 3. Eat healthy, avoid fried foods and eat more fruits and vegetables 4. Maintain adequate blood pressure, blood sugar, and blood cholesterol level. Reducing the risk of stroke and cardiovascular disease also helps promoting better memory. 5. Avoid stressful situations. Live a simple life and avoid aggravations. Organize your time and prepare for the next day in anticipation. 6. Sleep well, avoid any interruptions of sleep and  avoid any distractions in the bedroom that may interfere with adequate sleep quality 7. Avoid sugar, avoid sweets as there is a strong link between excessive sugar intake, diabetes, and cognitive impairment We discussed the Mediterranean diet, which has been shown to help patients reduce the risk of progressive memory disorders and reduces cardiovascular risk. This includes eating fish, eat fruits and green leafy vegetables, nuts like almonds and hazelnuts, walnuts, and also use olive oil. Avoid fast foods and fried foods as much as possible. Avoid sweets and sugar as sugar use has been linked to worsening of memory function.  There is always a concern of gradual progression of memory problems. If this is the case, then we may need to adjust level of care according to patient needs. Support, both to the patient and caregiver, should then be put into place.      You have been referred for a neuropsychological evaluation (i.e., evaluation of memory and thinking abilities). Please bring someone with you to this appointment if possible, as it is helpful for the doctor to hear from both you and another adult who knows you well. Please bring eyeglasses and hearing aids if you wear them.    The evaluation will take approximately 3 hours and has two parts:   The first part is a clinical interview with the neuropsychologist (Dr. Milbert Coulter or Dr. Roseanne Reno). During the interview, the neuropsychologist will speak with you and the individual you brought to the appointment.    The second part of the evaluation is testing with the doctor's technician Annabelle Harman or Selena Batten). During the testing, the technician will ask you to remember different types of material, solve problems, and answer some questionnaires.  Your family member will not be present for this portion of the evaluation.   Please note: We must reserve several hours of the neuropsychologist's time and the psychometrician's time for your evaluation appointment. As such,  there is a No-Show fee of $100. If you are unable to attend any of your appointments, please contact our office as soon as possible to reschedule.    FALL PRECAUTIONS: Be cautious when walking. Scan the area for obstacles that may increase the risk of trips and falls. When getting up in the mornings, sit up at the edge of the bed for a few minutes before getting out of bed. Consider elevating the bed at the head end to avoid drop of blood pressure when getting up. Walk always in a well-lit room (use night lights in the walls). Avoid area rugs or power cords from appliances in the middle of the walkways. Use a walker or a cane if necessary and consider physical therapy for balance exercise. Get your eyesight checked regularly.  FINANCIAL OVERSIGHT: Supervision, especially oversight when making financial decisions or transactions is also recommended.  HOME SAFETY: Consider the safety of the kitchen when operating appliances like stoves, microwave oven, and blender. Consider having supervision and share cooking responsibilities until no longer able to participate in those. Accidents with firearms and other hazards in the house should be identified and addressed as well.   ABILITY TO BE LEFT ALONE: If patient is unable to contact 911 operator, consider using LifeLine, or when the need is there, arrange for someone to stay with patients. Smoking is a fire hazard, consider supervision or cessation. Risk of wandering should be assessed by caregiver and if detected at any point, supervision and safe proof recommendations should be instituted.  MEDICATION SUPERVISION: Inability to self-administer medication needs to be constantly addressed. Implement a mechanism to ensure safe administration of the medications.   DRIVING: Regarding driving, in patients with progressive memory problems, driving will be impaired. We advise to have someone else do the driving if trouble finding directions or if minor accidents  are reported. Independent driving assessment is available to determine safety of driving.   If you are interested in the driving assessment, you can contact the following:  [employer] in [place] [phone]  [facility] [phone]  [facility] [phone] or [phone]    Mediterranean Diet A Mediterranean diet refers to food and lifestyle choices that are based on the traditions of countries located on the [employer] [place]. This way of eating has been shown to help prevent certain conditions and improve outcomes for people who have chronic diseases, like kidney disease and heart disease. What are tips for following this plan? Lifestyle  Cook and eat meals together with your family, when possible. Drink enough fluid to keep your urine clear or pale yellow. Be physically active every day. This includes: Aerobic exercise like running or swimming. Leisure activities like gardening, walking, or housework. Get 7-8 hours of sleep each night. If recommended by your health care provider, drink red wine in moderation. This means 1 glass a day for nonpregnant women and 2 glasses a day for men. A glass of wine equals 5 oz (150 mL). Reading food labels  Check the serving size of packaged foods. For foods such as rice and pasta, the serving size refers to the amount of cooked product, not dry. Check the total fat in packaged foods. Avoid foods that have saturated fat or trans fats. Check  the ingredients list for added sugars, such as corn syrup. Shopping  At the grocery store, buy most of your food from the areas near the walls of the store. This includes: Fresh fruits and vegetables (produce). Grains, beans, nuts, and seeds. Some of these may be available in unpackaged forms or large amounts (in bulk). Fresh seafood. Poultry and eggs. Low-fat dairy products. Buy whole ingredients instead of prepackaged  foods. Buy fresh fruits and vegetables in-season from local farmers markets. Buy frozen fruits and vegetables in resealable bags. If you do not have access to quality fresh seafood, buy precooked frozen shrimp or canned fish, such as tuna, salmon, or sardines. Buy small amounts of raw or cooked vegetables, salads, or olives from the deli or salad bar at your store. Stock your pantry so you always have certain foods on hand, such as olive oil, canned tuna, canned tomatoes, rice, pasta, and beans. Cooking  Cook foods with extra-virgin olive oil instead of using butter or other vegetable oils. Have meat as a side dish, and have vegetables or grains as your main dish. This means having meat in small portions or adding small amounts of meat to foods like pasta or stew. Use beans or vegetables instead of meat in common dishes like chili or lasagna. Experiment with different cooking methods. Try roasting or broiling vegetables instead of steaming or sauteing them. Add frozen vegetables to soups, stews, pasta, or rice. Add nuts or seeds for added healthy fat at each meal. You can add these to yogurt, salads, or vegetable dishes. Marinate fish or vegetables using olive oil, lemon juice, garlic, and fresh herbs. Meal planning  Plan to eat 1 vegetarian meal one day each week. Try to work up to 2 vegetarian meals, if possible. Eat seafood 2 or more times a week. Have healthy snacks readily available, such as: Vegetable sticks with hummus. Greek yogurt. Fruit and nut trail mix. Eat balanced meals throughout the week. This includes: Fruit: 2-3 servings a day Vegetables: 4-5 servings a day Low-fat dairy: 2 servings a day Fish, poultry, or lean meat: 1 serving a day Beans and legumes: 2 or more servings a week Nuts and seeds: 1-2 servings a day Whole grains: 6-8 servings a day Extra-virgin olive oil: 3-4 servings a day Limit red meat and sweets to only a few servings a month What are my food  choices? Mediterranean diet Recommended Grains: Whole-grain pasta. Brown rice. Bulgar wheat. Polenta. Couscous. Whole-wheat bread. Orpah Cobb. Vegetables: Artichokes. Beets. Broccoli. Cabbage. Carrots. Eggplant. Green beans. Chard. Kale. Spinach. Onions. Leeks. Peas. Squash. Tomatoes. Peppers. Radishes. Fruits: Apples. Apricots. Avocado. Berries. Bananas. Cherries. Dates. Figs. Grapes. Lemons. Melon. Oranges. Peaches. Plums. Pomegranate. Meats and other protein foods: Beans. Almonds. Sunflower seeds. Pine nuts. Peanuts. Cod. Salmon. Scallops. Shrimp. Tuna. Tilapia. Clams. Oysters. Eggs. Dairy: Low-fat milk. Cheese. Greek yogurt. Beverages: Water. Red wine. Herbal tea. Fats and oils: Extra virgin olive oil. Avocado oil. Grape seed oil. Sweets and desserts: [place] yogurt with honey. Baked apples. Poached pears. Trail mix. Seasoning and other foods: Basil. Cilantro. Coriander. Cumin. Mint. Parsley. Sage. Rosemary. Tarragon. Garlic. Oregano. Thyme. Pepper. Balsalmic vinegar. Tahini. Hummus. Tomato sauce. Olives. Mushrooms. Limit these Grains: Prepackaged pasta or rice dishes. Prepackaged cereal with added sugar. Vegetables: Deep fried potatoes (french fries). Fruits: Fruit canned in syrup. Meats and other protein foods: Beef. Pork. Lamb. Poultry with skin. Hot dogs. Tomasa Blase. Dairy: Ice cream. Sour cream. Whole milk. Beverages: Juice. Sugar-sweetened soft drinks. Beer. Liquor and spirits. Fats and oils: Butter.  Canola oil. Vegetable oil. Beef fat (tallow). Lard. Sweets and desserts: Cookies. Cakes. Pies. Candy. Seasoning and other foods: Mayonnaise. Premade sauces and marinades. The items listed may not be a complete list. Talk with your dietitian about what dietary choices are right for you. Summary The Mediterranean diet includes both food and lifestyle choices. Eat a variety of fresh fruits and vegetables, beans, nuts, seeds, and whole grains. Limit the amount of red meat and sweets that  you eat. Talk with your health care provider about whether it is safe for you to drink red wine in moderation. This means 1 glass a day for nonpregnant women and 2 glasses a day for men. A glass of wine equals 5 oz (150 mL). This information is not intended to replace advice given to you by your health care provider. Make sure you discuss any questions you have with your health care provider. Document Released: 05/10/2016 Document Revised: 06/12/2016 Document Reviewed: 05/10/2016 Elsevier Interactive Patient Education  2017 [employer].

## 2023-06-05 NOTE — Progress Notes (Signed)
Assessment/Plan:   Memory Impairment of unclear etiology, concern for vascular disease  Ryan Vaughan is a very pleasant 77 y.o. RH male hypertension, hyperlipidemia, atrial fibrillation on chronic Xarelto, history of CVA in 2015, CAD, RAS, vitamin D deficiency, DM2, bilateral carotid artery stenosis, GERD, seen today for evaluation of memory loss. MoCA today is 16/30. He is already on donepezil 10 mg daily, and memantine 5 mg twice daily per PCP, seen today in follow up to discuss the MRI of the brain results. These were personally reviewed, remarkable for progressed ischemia in the L PCA territory since a November MRI, with a moderate sized late subacute to early chronic cortical infarct there now. Associated hemosiderin but no malignant hemorrhagic transformation or associated mass effect was noted.  Also seen, moderately advanced small vessel disease without changes from prior film.  He was referred to Dr. Link Snuffer, IR a for bilateral common carotid arteriograms, right vertebral arteriogram and left subclavian arteriogram by IR 05/17/2023 showing severe stenosis of the right posterior cerebral artery P1 segment and to a lesser degree of the left posterior cerebral artery, moderate stenosis of the right middle cerebral M1 segment, nonopacification of the left vertebral artery proximally.  IR and vascular surgery are recommending a repeat angiogram 6 months after this one, to further evaluate the R P1 L P2 stenosis.  Currently she is on Xarelto and baby aspirin.  This patient is accompanied in the office by his wife  Previous records as well as any outside records available were reviewed prior to todays visit. He was last seen on 02/15/23 with [facility] 16/30.    Follow up in 6  months. Continue donepezil 10 mg daily and memantine 5 mg twice daily per PCP Replenish vitamin B12 (304) Patient is scheduled for neurocognitive testing for clarity of diagnosis Recommend psychotherapy for situational  depression Recommend good control of cardiovascular risk factors. Continue Xarelto and baby ASA daily Continue to control mood as per PCP Recommend increasing socialization and physical activities Patient is to be scheduled for follow-up imaging in 6 months per IR to evaluate the R P1 L P2 stenosis seen in the diagnostic cerebral angiogram.    Initial visit 02/15/23    How long did patient have memory difficulties? For about 1 year, worse over the last 6 months, especially wit STM.  Family noticed memory changes since Christmas of 2023.  He reports that he forgets new information.  He initially attributed  the symptoms to some depression but for the last 3 months, he has significant difficulty with numbers.  He denies any issues with LTM . repeats oneself?  Endorsed "every 5 minutes he asks the same question and tells the same story"-wife reports.  (He did not repeat himself during this visit) Disoriented when walking into a room?  Patient denies except occasionally not remembering what patient came to the room for   Leaving objects in unusual places?   denies other than occasionally misplacing his glasses.  Wandering behavior? denies   Any personality changes ? denies   Any history of depression?:  Patient denies but daughter reports that he may be experiencing situational depression, with less desire of doing activities outside, having a feeling of worthlessness, decreased appetite.  His daughter states that "my parents do not believe in mental illness "but some of his friends have noticed that he is depressed. Hallucinations or paranoia?  denies   Seizures? denies    Any sleep changes?  Sleeps well "too much "- daughter says.  Denies vivid dreams, REM behavior or sleepwalking   Sleep apnea? denies   Any hygiene concerns?  denies   Independent of bathing and dressing?  Endorsed  Does the patient need help with medications? Wife is in charge for the last 2 months as he was not taking Xarelto.     Who is in charge of the finances? Patient  is in charge     Any changes in appetite? Decreased. Sees GI because he has early satiation, for endoscopy in 04/2023.   Patient have trouble swallowing?  denies   Does the patient cook? No  Any kitchen accidents such as leaving the stove on? Patient denies   Any headaches?  denies   Chronic back pain?  Knee and back chronic pain due to arthritis.  "I need surgery " Ambulates with difficulty? denies   Recent falls or head injuries? denies     Vision changes? Unilateral weakness, numbness or tingling?  denies   Any tremors?  denies   Any anosmia?  denies   Any incontinence of urine? denies   Any bowel dysfunction? denies      Patient lives with wife  History of heavy alcohol intake? denies   History of heavy tobacco use? denies   Family history of dementia?  Denies  Does patient drive? Yes. Daughter says sometimes he has to think a little more about the directions, but she not worried about him getting lost.  Retired Education administrator.   MRI of the brain at [facility] (no images available) from 08/12/2022 showed moderate chronic small invasive disease with tiny embolic strokes in the left posterior frontal vertex and left occipital lobe.  Bilateral common carotid arteriograms, right vertebral arteriogram and left subclavian arteriogram by IR 05/17/2023 shows severe stenosis of the right posterior cerebral artery P1 segment and to a lesser degree of the left posterior cerebral artery, moderate stenosis of the right middle cerebral M1 segment, nonopacification of the left vertebral artery proximally.   CURRENT MEDICATIONS:  Outpatient Encounter Medications as of 06/05/2023  Medication Sig   acetaminophen (TYLENOL) 500 MG tablet Take 500-1,500 mg by mouth every 8 (eight) hours as needed for moderate pain or headache.   aspirin EC 81 MG tablet Take 81 mg by mouth daily.   atorvastatin (LIPITOR) 80 MG tablet TAKE 1 TABLET BY MOUTH ONCE DAILY    carvedilol (COREG) 12.5 MG tablet Take 1 tablet (12.5 mg total) by mouth 2 (two) times daily.   cyanocobalamin (VITAMIN B12) 1000 MCG tablet Take 1,000 mcg by mouth daily.   donepezil (ARICEPT) 5 MG tablet Take 5 mg by mouth at bedtime.   escitalopram (LEXAPRO) 5 MG tablet Take 5 mg by mouth daily.   gabapentin (NEURONTIN) 800 MG tablet Take 800 mg by mouth as needed for pain.   glimepiride (AMARYL) 4 MG tablet Take 4 mg by mouth 2 (two) times daily.   LANTUS SOLOSTAR 100 UNIT/ML Solostar Pen Inject 25 Units into the skin at bedtime.   lisinopril (ZESTRIL) 10 MG tablet Take 1 tablet (10 mg total) by mouth daily.   memantine (NAMENDA) 5 MG tablet Take 5 mg by mouth 2 (two) times daily.   nitroGLYCERIN (NITROSTAT) 0.4 MG SL tablet Place 1 tablet (0.4 mg total) under the tongue every 5 (five) minutes as needed for chest pain.   pantoprazole (PROTONIX) 40 MG tablet Take 1 tablet (40 mg total) by mouth daily.   rivaroxaban (XARELTO) 20 MG TABS tablet Take 1 tablet (20 mg total) by mouth  daily with supper.   Vitamin D, Ergocalciferol, (DRISDOL) 1.25 MG (50000 UT) CAPS capsule Take 50,000 Units by mouth every Friday.    No facility-administered encounter medications on file as of 06/05/2023.        No data to display            02/15/2023    9:00 AM  Montreal Cognitive Assessment   Visuospatial/ Executive (0/5) 3  Naming (0/3) 2  Attention: Read list of digits (0/2) 2  Attention: Read list of letters (0/1) 1  Attention: Serial 7 subtraction starting at 100 (0/3) 3  Language: Repeat phrase (0/2) 0  Language : Fluency (0/1) 0  Abstraction (0/2) 2  Delayed Recall (0/5) 0  Orientation (0/6) 2  Total 15  Adjusted Score (based on education) 16   Thank you for allowing [place] the opportunity to participate in the care of this nice patient. Please do not hesitate to contact us for any questions or concerns.   Total time spent on today's visit was 34 minutes dedicated to this patient today,  preparing to see patient, examining the patient, ordering tests and/or medications and counseling the patient, documenting clinical information in the EHR or other health record, independently interpreting results and communicating results to the patient/family, discussing treatment and goals, answering patient's questions and coordinating care.  Cc:  Paulina Fusi, MD  Marlowe Kays 06/05/2023 6:35 AM

## 2023-06-17 DIAGNOSIS — D649 Anemia, unspecified: Secondary | ICD-10-CM | POA: Diagnosis not present

## 2023-07-08 ENCOUNTER — Ambulatory Visit: Payer: PPO | Admitting: Gastroenterology

## 2023-07-15 DIAGNOSIS — Z0000 Encounter for general adult medical examination without abnormal findings: Secondary | ICD-10-CM | POA: Diagnosis not present

## 2023-07-15 DIAGNOSIS — Z9181 History of falling: Secondary | ICD-10-CM | POA: Diagnosis not present

## 2023-07-16 DIAGNOSIS — Z6838 Body mass index (BMI) 38.0-38.9, adult: Secondary | ICD-10-CM | POA: Diagnosis not present

## 2023-07-16 DIAGNOSIS — I10 Essential (primary) hypertension: Secondary | ICD-10-CM | POA: Diagnosis not present

## 2023-07-16 DIAGNOSIS — Z7901 Long term (current) use of anticoagulants: Secondary | ICD-10-CM | POA: Diagnosis not present

## 2023-07-16 DIAGNOSIS — D692 Other nonthrombocytopenic purpura: Secondary | ICD-10-CM | POA: Diagnosis not present

## 2023-07-16 DIAGNOSIS — D6869 Other thrombophilia: Secondary | ICD-10-CM | POA: Diagnosis not present

## 2023-07-16 DIAGNOSIS — I480 Paroxysmal atrial fibrillation: Secondary | ICD-10-CM | POA: Diagnosis not present

## 2023-07-18 ENCOUNTER — Other Ambulatory Visit: Payer: Self-pay

## 2023-07-18 MED ORDER — PANTOPRAZOLE SODIUM 40 MG PO TBEC: 40.0000 mg | DELAYED_RELEASE_TABLET | Freq: Every day | ORAL | 11 refills | Status: DC

## 2023-07-18 NOTE — Telephone Encounter (Signed)
Pantoprazole refilled as pharmacy requested.

## 2023-08-12 DIAGNOSIS — G300 Alzheimer's disease with early onset: Secondary | ICD-10-CM | POA: Diagnosis not present

## 2023-08-12 DIAGNOSIS — F331 Major depressive disorder, recurrent, moderate: Secondary | ICD-10-CM | POA: Diagnosis not present

## 2023-08-12 DIAGNOSIS — Z23 Encounter for immunization: Secondary | ICD-10-CM | POA: Diagnosis not present

## 2023-08-12 DIAGNOSIS — I10 Essential (primary) hypertension: Secondary | ICD-10-CM | POA: Diagnosis not present

## 2023-08-12 DIAGNOSIS — E785 Hyperlipidemia, unspecified: Secondary | ICD-10-CM | POA: Diagnosis not present

## 2023-08-12 DIAGNOSIS — M109 Gout, unspecified: Secondary | ICD-10-CM | POA: Diagnosis not present

## 2023-08-12 DIAGNOSIS — I2510 Atherosclerotic heart disease of native coronary artery without angina pectoris: Secondary | ICD-10-CM | POA: Diagnosis not present

## 2023-08-12 DIAGNOSIS — E1165 Type 2 diabetes mellitus with hyperglycemia: Secondary | ICD-10-CM | POA: Diagnosis not present

## 2023-08-12 DIAGNOSIS — I4891 Unspecified atrial fibrillation: Secondary | ICD-10-CM | POA: Diagnosis not present

## 2023-08-12 DIAGNOSIS — E1129 Type 2 diabetes mellitus with other diabetic kidney complication: Secondary | ICD-10-CM | POA: Diagnosis not present

## 2023-08-12 DIAGNOSIS — F0280 Dementia in other diseases classified elsewhere without behavioral disturbance: Secondary | ICD-10-CM | POA: Diagnosis not present

## 2023-08-18 ENCOUNTER — Other Ambulatory Visit: Payer: Self-pay | Admitting: Cardiology

## 2023-09-26 ENCOUNTER — Emergency Department (HOSPITAL_COMMUNITY)
Admission: EM | Admit: 2023-09-26 | Discharge: 2023-09-27 | Discharge status: Against Medical Advice | Payer: PPO | Attending: Emergency Medicine | Admitting: Emergency Medicine

## 2023-09-26 ENCOUNTER — Telehealth: Payer: Self-pay | Admitting: Cardiology

## 2023-09-26 ENCOUNTER — Emergency Department (HOSPITAL_COMMUNITY): Payer: PPO

## 2023-09-26 DIAGNOSIS — Z79899 Other long term (current) drug therapy: Secondary | ICD-10-CM | POA: Diagnosis not present

## 2023-09-26 DIAGNOSIS — J449 Chronic obstructive pulmonary disease, unspecified: Secondary | ICD-10-CM | POA: Diagnosis not present

## 2023-09-26 DIAGNOSIS — E119 Type 2 diabetes mellitus without complications: Secondary | ICD-10-CM | POA: Diagnosis not present

## 2023-09-26 DIAGNOSIS — Z794 Long term (current) use of insulin: Secondary | ICD-10-CM | POA: Diagnosis not present

## 2023-09-26 DIAGNOSIS — I4891 Unspecified atrial fibrillation: Secondary | ICD-10-CM | POA: Insufficient documentation

## 2023-09-26 DIAGNOSIS — Z8673 Personal history of transient ischemic attack (TIA), and cerebral infarction without residual deficits: Secondary | ICD-10-CM | POA: Diagnosis not present

## 2023-09-26 DIAGNOSIS — K922 Gastrointestinal hemorrhage, unspecified: Secondary | ICD-10-CM | POA: Diagnosis not present

## 2023-09-26 DIAGNOSIS — Z7901 Long term (current) use of anticoagulants: Secondary | ICD-10-CM | POA: Diagnosis not present

## 2023-09-26 DIAGNOSIS — I509 Heart failure, unspecified: Secondary | ICD-10-CM | POA: Diagnosis not present

## 2023-09-26 DIAGNOSIS — Z5321 Procedure and treatment not carried out due to patient leaving prior to being seen by health care provider: Secondary | ICD-10-CM | POA: Diagnosis not present

## 2023-09-26 DIAGNOSIS — Z7982 Long term (current) use of aspirin: Secondary | ICD-10-CM | POA: Diagnosis not present

## 2023-09-26 DIAGNOSIS — R2243 Localized swelling, mass and lump, lower limb, bilateral: Secondary | ICD-10-CM | POA: Diagnosis not present

## 2023-09-26 DIAGNOSIS — I110 Hypertensive heart disease with heart failure: Secondary | ICD-10-CM | POA: Insufficient documentation

## 2023-09-26 DIAGNOSIS — R0989 Other specified symptoms and signs involving the circulatory and respiratory systems: Secondary | ICD-10-CM | POA: Diagnosis not present

## 2023-09-26 DIAGNOSIS — J439 Emphysema, unspecified: Secondary | ICD-10-CM | POA: Insufficient documentation

## 2023-09-26 DIAGNOSIS — R0609 Other forms of dyspnea: Secondary | ICD-10-CM | POA: Diagnosis not present

## 2023-09-26 DIAGNOSIS — D649 Anemia, unspecified: Secondary | ICD-10-CM | POA: Diagnosis not present

## 2023-09-26 DIAGNOSIS — R600 Localized edema: Secondary | ICD-10-CM | POA: Diagnosis not present

## 2023-09-26 DIAGNOSIS — I517 Cardiomegaly: Secondary | ICD-10-CM | POA: Diagnosis not present

## 2023-09-26 DIAGNOSIS — R0602 Shortness of breath: Secondary | ICD-10-CM | POA: Diagnosis not present

## 2023-09-26 DIAGNOSIS — I2510 Atherosclerotic heart disease of native coronary artery without angina pectoris: Secondary | ICD-10-CM | POA: Diagnosis not present

## 2023-09-26 DIAGNOSIS — Z7984 Long term (current) use of oral hypoglycemic drugs: Secondary | ICD-10-CM | POA: Diagnosis not present

## 2023-09-26 DIAGNOSIS — M7989 Other specified soft tissue disorders: Secondary | ICD-10-CM | POA: Diagnosis not present

## 2023-09-26 LAB — CBC
HCT: 27.4 % — ABNORMAL LOW (ref 39.0–52.0)
Hemoglobin: 8.4 g/dL — ABNORMAL LOW (ref 13.0–17.0)
MCH: 30.7 pg (ref 26.0–34.0)
MCHC: 30.7 g/dL (ref 30.0–36.0)
MCV: 100 fL (ref 80.0–100.0)
Platelets: 172 10*3/uL (ref 150–400)
RBC: 2.74 MIL/uL — ABNORMAL LOW (ref 4.22–5.81)
RDW: 13.6 % (ref 11.5–15.5)
WBC: 4.7 10*3/uL (ref 4.0–10.5)
nRBC: 0 % (ref 0.0–0.2)

## 2023-09-26 LAB — BASIC METABOLIC PANEL
Anion gap: 7 (ref 5–15)
BUN: 19 mg/dL (ref 8–23)
CO2: 28 mmol/L (ref 22–32)
Calcium: 8.5 mg/dL — ABNORMAL LOW (ref 8.9–10.3)
Chloride: 104 mmol/L (ref 98–111)
Creatinine, Ser: 0.98 mg/dL (ref 0.61–1.24)
GFR, Estimated: >60 mL/min (ref >60)
Glucose, Bld: 161 mg/dL — ABNORMAL HIGH (ref 70–99)
Potassium: 3.9 mmol/L (ref 3.5–5.1)
Sodium: 139 mmol/L (ref 135–145)

## 2023-09-26 NOTE — Telephone Encounter (Signed)
Pt c/o swelling/edema: STAT if pt has developed SOB within 24 hours  If swelling, where is the swelling located? legs and ankles  How much weight have you gained and in what time span?   Have you gained 2 pounds in a day or 5 pounds in a week?   Do you have a log of your daily weights (if so, list)?   Are you currently taking a fluid pill? Not sure  Are you currently SOB? Short of breath- when he walks to the mailbox- also has a wheezing sound when he lay down at night  Have you traveled recently in a car or plane for an extended period of time? No- patient wanted an appointment- made an appointment for next Friday(10-04-23) please call to evaluate

## 2023-09-26 NOTE — Telephone Encounter (Signed)
Spoke to wife, Karena Addison, per [place]. She states that the patients ankle and legs are swollen. She also states that he is short of breath when walking and at night time he makes a rattling sound when he is sleeping. She reports her husbands last weight in September was 307, the patient weighed today at 320 pounds. Wife was advised to take the patient to th ER for evaluation. She verbalized understanding and had no further questions.

## 2023-09-26 NOTE — ED Notes (Signed)
Patient left without being seen, dragging OTF.

## 2023-09-26 NOTE — ED Triage Notes (Signed)
Pt c/o bilateral leg swelling for the last week. Endorses slight DOE. Denies CP. Pt denies hx of CHF. No diuretics on home med list.

## 2023-09-27 ENCOUNTER — Emergency Department (HOSPITAL_BASED_OUTPATIENT_CLINIC_OR_DEPARTMENT_OTHER): Payer: PPO

## 2023-09-27 ENCOUNTER — Encounter (HOSPITAL_BASED_OUTPATIENT_CLINIC_OR_DEPARTMENT_OTHER): Payer: Self-pay

## 2023-09-27 ENCOUNTER — Telehealth: Payer: Self-pay | Admitting: Cardiology

## 2023-09-27 ENCOUNTER — Emergency Department (HOSPITAL_BASED_OUTPATIENT_CLINIC_OR_DEPARTMENT_OTHER)
Admission: EM | Admit: 2023-09-27 | Discharge: 2023-09-27 | Disposition: A | Discharge status: Home / Self Care | Payer: PPO | Attending: Emergency Medicine | Admitting: Emergency Medicine

## 2023-09-27 ENCOUNTER — Encounter: Payer: Self-pay | Admitting: Cardiology

## 2023-09-27 ENCOUNTER — Other Ambulatory Visit: Payer: Self-pay

## 2023-09-27 DIAGNOSIS — Z79899 Other long term (current) drug therapy: Secondary | ICD-10-CM | POA: Insufficient documentation

## 2023-09-27 DIAGNOSIS — D649 Anemia, unspecified: Secondary | ICD-10-CM | POA: Insufficient documentation

## 2023-09-27 DIAGNOSIS — E119 Type 2 diabetes mellitus without complications: Secondary | ICD-10-CM | POA: Insufficient documentation

## 2023-09-27 DIAGNOSIS — I517 Cardiomegaly: Secondary | ICD-10-CM | POA: Diagnosis not present

## 2023-09-27 DIAGNOSIS — R600 Localized edema: Secondary | ICD-10-CM | POA: Insufficient documentation

## 2023-09-27 DIAGNOSIS — Z7982 Long term (current) use of aspirin: Secondary | ICD-10-CM | POA: Insufficient documentation

## 2023-09-27 DIAGNOSIS — Z794 Long term (current) use of insulin: Secondary | ICD-10-CM | POA: Insufficient documentation

## 2023-09-27 DIAGNOSIS — Z7901 Long term (current) use of anticoagulants: Secondary | ICD-10-CM | POA: Insufficient documentation

## 2023-09-27 DIAGNOSIS — M7989 Other specified soft tissue disorders: Secondary | ICD-10-CM | POA: Diagnosis not present

## 2023-09-27 DIAGNOSIS — I2510 Atherosclerotic heart disease of native coronary artery without angina pectoris: Secondary | ICD-10-CM | POA: Insufficient documentation

## 2023-09-27 DIAGNOSIS — K922 Gastrointestinal hemorrhage, unspecified: Secondary | ICD-10-CM | POA: Insufficient documentation

## 2023-09-27 DIAGNOSIS — I509 Heart failure, unspecified: Secondary | ICD-10-CM | POA: Diagnosis not present

## 2023-09-27 DIAGNOSIS — R0989 Other specified symptoms and signs involving the circulatory and respiratory systems: Secondary | ICD-10-CM | POA: Diagnosis not present

## 2023-09-27 DIAGNOSIS — Z7984 Long term (current) use of oral hypoglycemic drugs: Secondary | ICD-10-CM | POA: Insufficient documentation

## 2023-09-27 LAB — CBC WITH DIFFERENTIAL/PLATELET
Abs Immature Granulocytes: 0.02 10*3/uL (ref 0.00–0.07)
Basophils Absolute: 0 10*3/uL (ref 0.0–0.1)
Basophils Relative: 1 %
Eosinophils Absolute: 0.2 10*3/uL (ref 0.0–0.5)
Eosinophils Relative: 4 %
HCT: 27.1 % — ABNORMAL LOW (ref 39.0–52.0)
Hemoglobin: 8.5 g/dL — ABNORMAL LOW (ref 13.0–17.0)
Immature Granulocytes: 0 %
Lymphocytes Relative: 20 %
Lymphs Abs: 1.1 10*3/uL (ref 0.7–4.0)
MCH: 30.7 pg (ref 26.0–34.0)
MCHC: 31.4 g/dL (ref 30.0–36.0)
MCV: 97.8 fL (ref 80.0–100.0)
Monocytes Absolute: 0.4 10*3/uL (ref 0.1–1.0)
Monocytes Relative: 8 %
Neutro Abs: 3.4 10*3/uL (ref 1.7–7.7)
Neutrophils Relative %: 67 %
Platelets: 163 10*3/uL (ref 150–400)
RBC: 2.77 MIL/uL — ABNORMAL LOW (ref 4.22–5.81)
RDW: 13.5 % (ref 11.5–15.5)
WBC: 5.2 10*3/uL (ref 4.0–10.5)
nRBC: 0 % (ref 0.0–0.2)

## 2023-09-27 LAB — COMPREHENSIVE METABOLIC PANEL
ALT: 11 U/L (ref 0–44)
AST: 17 U/L (ref 15–41)
Albumin: 3.5 g/dL (ref 3.5–5.0)
Alkaline Phosphatase: 56 U/L (ref 38–126)
Anion gap: 7 (ref 5–15)
BUN: 17 mg/dL (ref 8–23)
CO2: 26 mmol/L (ref 22–32)
Calcium: 8.5 mg/dL — ABNORMAL LOW (ref 8.9–10.3)
Chloride: 104 mmol/L (ref 98–111)
Creatinine, Ser: 0.88 mg/dL (ref 0.61–1.24)
GFR, Estimated: >60 mL/min (ref >60)
Glucose, Bld: 242 mg/dL — ABNORMAL HIGH (ref 70–99)
Potassium: 4.2 mmol/L (ref 3.5–5.1)
Sodium: 137 mmol/L (ref 135–145)
Total Bilirubin: 0.6 mg/dL (ref <1.2)
Total Protein: 6.8 g/dL (ref 6.5–8.1)

## 2023-09-27 LAB — URINALYSIS, MICROSCOPIC (REFLEX)

## 2023-09-27 LAB — OCCULT BLOOD X 1 CARD TO LAB, STOOL: Fecal Occult Bld: POSITIVE — AB

## 2023-09-27 LAB — URINALYSIS, ROUTINE W REFLEX MICROSCOPIC
Bilirubin Urine: NEGATIVE
Glucose, UA: NEGATIVE mg/dL
Ketones, ur: NEGATIVE mg/dL
Leukocytes,Ua: NEGATIVE
Nitrite: NEGATIVE
Protein, ur: 30 mg/dL — AB
Specific Gravity, Urine: >=1.03 (ref 1.005–1.030)
pH: 5.5 (ref 5.0–8.0)

## 2023-09-27 LAB — BRAIN NATRIURETIC PEPTIDE: B Natriuretic Peptide: 221.1 pg/mL — ABNORMAL HIGH (ref 0.0–100.0)

## 2023-09-27 MED ORDER — FUROSEMIDE 10 MG/ML IJ SOLN
20.0000 mg | Freq: Once | INTRAMUSCULAR | Status: AC
  Administered 2023-09-27: 20 mg via INTRAVENOUS
  Filled 2023-09-27: qty 2

## 2023-09-27 MED ORDER — PANTOPRAZOLE SODIUM 40 MG IV SOLR
40.0000 mg | Freq: Once | INTRAVENOUS | Status: AC
  Administered 2023-09-27: 40 mg via INTRAVENOUS
  Filled 2023-09-27: qty 10

## 2023-09-27 MED ORDER — FUROSEMIDE 10 MG/ML IJ SOLN
40.0000 mg | Freq: Once | INTRAMUSCULAR | Status: AC
  Administered 2023-09-27: 40 mg via INTRAVENOUS
  Filled 2023-09-27: qty 4

## 2023-09-27 MED ORDER — FUROSEMIDE 20 MG PO TABS: 20.0000 mg | ORAL_TABLET | Freq: Every day | ORAL | 0 refills | Status: DC

## 2023-09-27 NOTE — ED Provider Notes (Signed)
I provided a substantive portion of the care of this patient.  I personally made/approved the management plan for this patient and take responsibility for the patient management.  EKG Interpretation Date/Time:  Friday September 27 2023 18:08:29 EST Ventricular Rate:  65 PR Interval:    QRS Duration:  103 QT Interval:  429 QTC Calculation: 447 R Axis:   73  Text Interpretation: Atrial fibrillation Probable anteroseptal infarct, old No significant change since last tracing Confirmed by Vanetta Mulders [id number]) on 09/27/2023 7:19:44 PM   Patient seen by me along with physician assistant.  Patient was at [facility] emergency department yesterday but wait was long.  So he left before he was formally seen however they had done a chest x-ray and then got some lab work.  And patient's hemoglobin was 8.4.  Patient has documentation that hemoglobin was 11 something in August.  He is on Xarelto for atrial fibs but no evidence of any gross blood melena or vomiting of blood.  Stool Hemoccult here positive but brown in color so not consistent with melena or hematochezia.  Suspect patient has been losing blood slowly over several weeks.  Patient's main concern in both cases was his leg swelling.  He is followed by cardiology.  And he is followed by [facility].  Patient is not on a diuretic.  His past medical history is significant for coronary artery disease diabetes atrial fibrillation TIAs renal artery aneurysm hypertension diabetes chronic anticoagulation and some emphysema of the lung.  Patient's hemoglobin here is 8.5 today symptoms significant change chest x-ray without significant changes from yesterday but there is a little bit of increased vascular congestion.  BNP is in the 200 range but not terribly high.  Platelets are normal.  Patient's renal function is normal.  We did walk the patient.  He does not walk fast because he is got bilateral chronic knee pain.  However he does not get winded or feel short  of breath.  But occasionally his sats would drop but then as soon as he would stop they would immediately go up into a good range.  Here his sats at rest have been in the upper 90s.  Patient does not feel short of breath.  Patient without any acute chest pain or any kind of chest discomfort at all.  EKG without any obvious changes.  Troponins however were not done because there was no complaint of pain.  And probably do not need to be done.  Long conversation with patient's family.  Will put him on some Lasix give a dose here tonight him follow-up with his cardiologist have him follow-up with [facility] gastroenterology and his primary care doctor.  They will bring him back or get him seen and probably will require admission if there is any red blood per rectum or if there is any black stool or if his shortness of breath gets worse at all.   Vanetta Mulders, MD 09/27/23 2112

## 2023-09-27 NOTE — ED Notes (Signed)
ED Provider at bedside. 

## 2023-09-27 NOTE — Telephone Encounter (Signed)
Spoke with Marylene Land after reviewing labs and advised that the pt's hemoglobin is low and he needs to be evaluated. Advised to go to [facility] for evaluation. Pt does not have any daily weights to know if the weight gain is related to fluid or from eating more than normal. Advised to have pt start doing daily weights and keep a log. Marylene Land verbalized understanding and had no additional questions.

## 2023-09-27 NOTE — Discharge Instructions (Addendum)
You were seen in emergency room today for lower extremity swelling and shortness of breath.  Have sent Lasix to your pharmacy please take as prescribed.  Please follow-up with primary care to recheck electrolytes and hemoglobin.  Please follow-up as soon as possible with your GI doctor for further workup and monitoring GI bleed.  Also make an appointment to follow-up with your cardiologist or primary care doctor for recheck.  Take the low-dose Lasix daily first thing in the morning.  Return for any new or worse symptoms.  Return for any frank blood or black stools.  Return to emergency room with new or worsening symptoms including dizziness lightheadedness or blood in stool.

## 2023-09-27 NOTE — ED Triage Notes (Signed)
Leg swelling for the past week, was at [facility] last night but left before being seen. Hemoglobin was low and doctor advised them to come here to find out why.

## 2023-09-27 NOTE — ED Provider Notes (Incomplete)
[place] [facility] AT [facility] Provider Note   CSN: [id number] Arrival date & time: 09/27/23  1416     History {Add pertinent medical, surgical, social history, OB history to HPI:1} Chief Complaint  Patient presents with   Leg Swelling    Ryan Vaughan is a 77 y.o. male patient with past medical history of atrial fibrillation on Xarelto, coronary artery disease, diabetes presenting to emergency room with bilateral lower extremity edema as well as low hemoglobin.  Reports he has had increased bilateral lower extremity edema ongoing for about a week.  Patient called cardiologist requesting the Lasix however they recommended he come to emergency room for further evaluation.  Patient was seen at [facility] yesterday had basic labs and chest x-ray ordered.  Hemoglobin 8.6.,  Which is 4 point drop since 4 months ago.  He is also reported some orthopnea, dyspnea on exertion.  Has known heart failure is not on Lasix. Denies chest pain, cough, dizziness or lightheadedness, abdominal pain nausea vomiting or diarrhea.  Has not noticed any blood in urine or in stool.  HPI     Home Medications Prior to Admission medications   Medication Sig Start Date End Date Taking? Authorizing Provider  acetaminophen (TYLENOL) 500 MG tablet Take 500-1,500 mg by mouth every 8 (eight) hours as needed for moderate pain or headache.    [provider]  aspirin EC 81 MG tablet Take 81 mg by mouth daily.    [provider]  atorvastatin (LIPITOR) 80 MG tablet TAKE 1 TABLET BY MOUTH ONCE DAILY 11/06/22   Revankar, Aundra Dubin, MD  carvedilol (COREG) 12.5 MG tablet Take 1 tablet (12.5 mg total) by mouth 2 (two) times daily. 08/19/23   Revankar, Aundra Dubin, MD  cyanocobalamin (VITAMIN B12) 1000 MCG tablet Take 1,000 mcg by mouth daily.    [provider]  donepezil (ARICEPT) 5 MG tablet Take 5 mg by mouth at bedtime. 12/24/22   [provider]  escitalopram (LEXAPRO) 5 MG  tablet Take 5 mg by mouth daily.    [provider]  gabapentin (NEURONTIN) 800 MG tablet Take 800 mg by mouth as needed for pain.    [provider]  glimepiride (AMARYL) 4 MG tablet Take 4 mg by mouth 2 (two) times daily. 08/10/19   [provider]  LANTUS SOLOSTAR 100 UNIT/ML Solostar Pen Inject 25 Units into the skin at bedtime. 05/15/22   [provider]  lisinopril (ZESTRIL) 10 MG tablet Take 1 tablet (10 mg total) by mouth daily. 04/17/23   Revankar, Aundra Dubin, MD  memantine (NAMENDA) 5 MG tablet Take 5 mg by mouth 2 (two) times daily. 01/24/23   [provider]  nitroGLYCERIN (NITROSTAT) 0.4 MG SL tablet Place 1 tablet (0.4 mg total) under the tongue every 5 (five) minutes as needed for chest pain. 08/24/19   Revankar, Aundra Dubin, MD  pantoprazole (PROTONIX) 40 MG tablet Take 1 tablet (40 mg total) by mouth daily. 07/18/23   Lynann Bologna, MD  rivaroxaban (XARELTO) 20 MG TABS tablet Take 1 tablet (20 mg total) by mouth daily with supper. 05/21/23   Revankar, Aundra Dubin, MD  Vitamin D, Ergocalciferol, (DRISDOL) 1.25 MG (50000 UT) CAPS capsule Take 50,000 Units by mouth every Friday.     [provider]      Allergies    Penicillins, Amiodarone, Isosorbide mononitrate er [isosorbide dinitrate], and Losartan    Review of Systems   Review of Systems  Respiratory:  Positive for shortness of breath.   Cardiovascular:  Positive for leg swelling.    Physical Exam Updated Vital Signs BP 138/78 (BP Location: Left Arm)   Pulse 67   Temp 98 F (36.7 C)   Resp 18   SpO2 99%  Physical Exam Vitals and nursing note reviewed.  Constitutional:      General: He is not in acute distress.    Appearance: He is not toxic-appearing.  HENT:     Head: Normocephalic and atraumatic.  Eyes:     General: No scleral icterus.    Conjunctiva/sclera: Conjunctivae normal.  Cardiovascular:     Rate and Rhythm: Normal rate and regular rhythm.     Pulses: Normal  pulses.     Heart sounds: Normal heart sounds.  Pulmonary:     Effort: Pulmonary effort is normal. No respiratory distress.     Breath sounds: Rales present.  Abdominal:     General: Abdomen is flat. Bowel sounds are normal.     Palpations: Abdomen is soft.     Tenderness: There is no abdominal tenderness.  Musculoskeletal:     Right lower leg: Edema present.     Left lower leg: Edema present.  Skin:    General: Skin is warm and dry.     Findings: No lesion.  Neurological:     General: No focal deficit present.     Mental Status: He is alert and oriented to person, place, and time. Mental status is at baseline.     ED Results / Procedures / Treatments   Labs (all labs ordered are listed, but only abnormal results are displayed) Labs Reviewed  CBC WITH DIFFERENTIAL/PLATELET - Abnormal; Notable for the following components:      Result Value   RBC 2.77 (*)    Hemoglobin 8.5 (*)    HCT 27.1 (*)    All other components within normal limits  COMPREHENSIVE METABOLIC PANEL - Abnormal; Notable for the following components:   Glucose, Bld 242 (*)    Calcium 8.5 (*)    All other components within normal limits  BRAIN NATRIURETIC PEPTIDE  URINALYSIS, ROUTINE W REFLEX MICROSCOPIC  POC OCCULT BLOOD, ED    EKG None  Radiology DG Chest 2 View Result Date: 09/26/2023 CLINICAL DATA:  SOB, leg swelling. EXAM: CHEST - 2 VIEW COMPARISON:  03/23/2020. FINDINGS: Hyperinflated lungs consistent with COPD. Grossly enlarged cardiac silhouette. Status post median sternotomy. Left atrial clip. No pneumothorax. There is pulmonary vascular congestion. No pleural effusion. Osseous structures grossly intact. IMPRESSION: COPD. Enlarged cardiac silhouette with vascular congestion. Electronically Signed   By: Layla Maw M.D.   On: 09/26/2023 17:03    Procedures Procedures  {Document cardiac monitor, telemetry assessment procedure when appropriate:1}  Medications Ordered in ED Medications -  No data to display  ED Course/ Medical Decision Making/ A&P   {   Click here for ABCD2, HEART and other calculatorsREFRESH Note before signing :1}                              Medical Decision Making Amount and/or Complexity of Data Reviewed Labs: ordered.  Risk Prescription drug management.   Charisse Klinefelter 77 y.o. presented today for BLE edema. Working Ddx: dependent edema, venous insufficiency, thrombophlebitis, secondary to medications, CHF, edema, AKI, nephrotic syndrome, dvt   R/o DDx: These are considered less likely than current impression due to history of present illness, physical exam, labs/imaging findings.  Review of prior external notes: Reviewed visit with GI 04/01/2023, OV 08/09/23  Unique Tests and My Interpretation:  CBC: No leukocytosis, hemoglobin is 8.5 similar to hemoglobin drawn yesterday. CMP: No electrolyte abnormality glucose elevated to 242, no AKI normal GFR BNP: 221 Hemoccult: positive  UA: Trace hemoglobin, negative for trend nitrites negative for leukocytes  EKG: afib   Chest x-ray: vascular congestion    Problem List / ED Course / Critical interventions / Medication management  Patient presenting to emergency room with increased swelling in lower extremity.  Patient has known history of heart failure.  Hemodynamically stable well-appearing and oxygenating well on room air.  Has noted some dyspnea on exertion as well as orthopnea.  Patient was seen yesterday for similar thing however never saw a provider.  CBC showed hemoglobin of 8.6 which was a 4 point drop since prior recent labs 4 months ago.  Patient denies any known bleeding has not noticed blood in urine or in stool. Denies pain.  Patient's Hemoccult is positive today.  Patient does not appear to have symptomatic anemia thus I feel this is likely a gradual decrease in hemoglobin over the past few months.  No other source of bleeding identified on exam.  Will ambulate patient to evaluate for  symptomatic anemia and make sure vitals are stable.  Patient is able to ambulate anticipate he will be able to go home following closely with primary care doctor as well as GI.  Patient is established with GI and has close follow-up with primary care who can monitor electrolytes and anemia. I ordered medication including lasix, protonix  Reevaluation of the patient after these medicines showed that the patient improved Patients vitals assessed. Upon arrival patient is hemodynamically stable.  I have reviewed the patients home medicines and have made adjustments as needed  Consult: None    Plan:  Signed off to Dr Deretha Emory   {Document critical care time when appropriate:1} {Document review of labs and clinical decision tools ie heart score, Chads2Vasc2 etc:1}  {Document your independent review of radiology images, and any outside records:1} {Document your discussion with family members, caretakers, and with consultants:1} {Document social determinants of health affecting pt's care:1} {Document your decision making why or why not admission, treatments were needed:1} Final Clinical Impression(s) / ED Diagnoses Final diagnoses:  Anemia, unspecified type  Gastrointestinal hemorrhage, unspecified gastrointestinal hemorrhage type  Bilateral lower extremity edema    Rx / DC Orders ED Discharge Orders     None

## 2023-09-27 NOTE — ED Notes (Signed)
Ambulated patient via pulse ox. Resting oxygen saturation 100%/ RR 17/ HR 72. Patient had a steady gait able to speak in complete sentences without difficulty. Oxygen saturations maintained within normal limits. Patient returned to room and placed back on monitor . Physician notified. Patient tolerated well.

## 2023-09-27 NOTE — Telephone Encounter (Signed)
Patient's daughter is calling to get call back in regards to MyChart message sent. States they would like a call back to discuss patient starting a fluid pill to help with swelling.

## 2023-10-03 DIAGNOSIS — I4891 Unspecified atrial fibrillation: Secondary | ICD-10-CM | POA: Diagnosis not present

## 2023-10-03 DIAGNOSIS — D62 Acute posthemorrhagic anemia: Secondary | ICD-10-CM | POA: Diagnosis not present

## 2023-10-03 DIAGNOSIS — R600 Localized edema: Secondary | ICD-10-CM | POA: Diagnosis not present

## 2023-10-03 DIAGNOSIS — R195 Other fecal abnormalities: Secondary | ICD-10-CM | POA: Diagnosis not present

## 2023-10-03 DIAGNOSIS — F331 Major depressive disorder, recurrent, moderate: Secondary | ICD-10-CM | POA: Diagnosis not present

## 2023-10-03 NOTE — Progress Notes (Signed)
 Cardiology Office Note:  .   Date:  10/04/2023  ID:  Ryan Vaughan, DOB 23-Apr-1946, MRN [id number] PCP: Keren Vicenta BRAVO, MD  [facility] Providers Cardiologist:  Monterrius Cardosa JONELLE Crape, MD    History of Present Illness: .   Ryan Vaughan is a 78 y.o. male with a past medical history of chronic atrial fibrillation, hypertension, carotid artery stenosis s/p carotid endarterectomy--follows with [facility] in [facility], renal artery aneurysm, CAD s/p CABG x 4, history of TIA, aneurysm of ascending aorta, emphysema, DM 2, dyslipidemia.  01/23/2023 Lexiscan  normal, low risk study, moderately reduced EF at 43% 12/21/2020 echo EF 55 to 60%, mild MR, mild dilatation ascending aorta 41 mm 09/20/2019 CABG x 4 LIMA to LAD, SVG to diagonal, sequential SVG to distal RCA and PL, clipping of atrial appendage 08/28/2019 left heart cath mid LAD 85% stenotic, circumflex 60 to 70% narrowing, mid to distal RCA 90 to 95% stenosis >> CVTS consultation  Most recently he was evaluated by Dr. Crape on 04/17/2023, he was stable from a cardiac perspective and advised to follow-up in 9 months.  He was evaluated in the emergency department on 09/27/2023 for pedal edema and hemoglobin of 8.4.  He was given Lasix  and advised follow-up with cardiology.  He presents today for follow-up after recent ED visit as outlined above.  He has a plan to see GI on Monday for further evaluation of his anemia, he has not noticed any overt bleeding.  He does endorse worsening fatigue.  He is currently on Lasix  20 mg daily since his visit at the ED, he does still have some pedal edema but is much better.  Was evaluated by the PCP yesterday, hemoglobin was 8.5, iron 30 and iron saturation 9.  He denies chest pain, palpitations, dyspnea, pnd, orthopnea, n, v, dizziness, syncope, weight gain, or early satiety.   ROS: Review of Systems  Constitutional:  Positive for malaise/fatigue.  Cardiovascular:  Positive for leg swelling.  All other  systems reviewed and are negative.    Studies Reviewed: .        Cardiac Studies & Procedures   CARDIAC CATHETERIZATION  CARDIAC CATHETERIZATION 08/28/2019  Narrative  Widely patent left main coronary artery  Mid LAD contains eccentric 85% stenosis after the first diagonal.  The first diagonal is small and contains ostial 90% stenosis.  A large ramus intermedius arises from the distal left main but is free of significant obstruction.  Circumflex gives origin to a small first obtuse marginal, in the mid vessel there is eccentric 60 to 70% narrowing.  2 smaller obtuse marginals arise distally and supplies collaterals to a large right coronary left ventricular branch which apparently has been totally occluded for greater than 10 years.  The right coronary artery is large.  It is totally occluded in the continuation after a small left ventricular branch arises.  The distal vessel is supplied by collaterals from left circumflex to the right coronary.  The mid to distal right coronary contains 90 to 95% eccentric angulated stenosis that enters a fusiform aneurysm =/> 4.5 mm in diameter).  The native vessel pre and post aneurysm is 3.5 mm.  The PDA is large and graftable.  Overall normal left ventricular function with EF 45 to 50%.  Normal LVEDP.  RECOMMENDATIONS:   TCTS consultation for consideration of multivessel grafting.  Significant blood pressure elevation was present this morning because no antihypertensive therapy was taken in the past 24 hours.  IV labetalol  was given to  improve blood pressure.  He should take his blood pressure medication as soon as he arrives home.  Xarelto  should be resumed later this evening or in a.m. depending upon when he ordinarily takes it.  He should not take the Xarelto  before he has had antihypertensive therapy.  Findings Coronary Findings Diagnostic  Dominance: Right  Left Anterior Descending Mid LAD lesion is 80% stenosed.  First Diagonal  Branch Vessel is small in size. 1st Diag lesion is 80% stenosed.  Left Circumflex Prox Cx to Mid Cx lesion is 65% stenosed.  First Obtuse Marginal Branch Vessel is small in size.  Right Coronary Artery Prox RCA to Mid RCA lesion is 55% stenosed. Mid RCA lesion is 95% stenosed. Non-stenotic Mid RCA to Dist RCA lesion.  Right Posterior Atrioventricular Artery RPAV lesion is 100% stenosed.  Third Right Posterolateral Branch Vessel is large in size. Collaterals 3rd RPL filled by collaterals from 3rd Mrg.  Intervention  No interventions have been documented.   STRESS TESTS  MYOCARDIAL PERFUSION IMAGING 01/23/2023  Narrative   The study is normal. The study is low risk.   Left ventricular function is abnormal. Global function is mioderately reduced. Nuclear stress EF: 43 %. The left ventricular ejection fraction is moderately decreased (30-44%). End diastolic cavity size is normal.   Prior study available for comparison from 12/14/2020.  ECHOCARDIOGRAM  ECHOCARDIOGRAM COMPLETE 12/21/2020  Narrative ECHOCARDIOGRAM REPORT    Patient Name:   Ryan Vaughan Date of Exam: 12/21/2020 Medical Rec #:  [id number]       Height:       77.0 in Accession #:    [id number]      Weight:       307.0 lb Date of Birth:  1945/10/23        BSA:          2.684 m Patient Age:    74 years        BP:           161/100 mmHg Patient Gender: M               HR:           78 bpm. Exam Location:  [facility]  Procedure: 2D Echo  Indications:    Decreased cardiac ejection fraction [R93.1 (ICD-10-CM)]  History:        Patient has prior history of Echocardiogram examinations, most recent 08/19/2019. Prior CABG, Carotid Disease and TIA; Risk Factors:Hypertension, Diabetes and Dyslipidemia.  Sonographer:    Lynwood Silvas Referring Phys: CYRUS Alka Falwell [facility]  IMPRESSIONS   1. Left ventricular ejection fraction, by estimation, is 55 to 60%. The left ventricle has normal function. The left ventricle has  no regional wall motion abnormalities. Left ventricular diastolic parameters are indeterminate. 2. Right ventricular systolic function is normal. The right ventricular size is normal. There is normal pulmonary artery systolic pressure. 3. Left atrial size was moderately dilated. 4. The mitral valve is normal in structure. Mild mitral valve regurgitation. No evidence of mitral stenosis. 5. The aortic valve is normal in structure. Aortic valve regurgitation is not visualized. No aortic stenosis is present. 6. There is mild dilatation of the ascending aorta, measuring 41 mm. 7. The inferior vena cava is normal in size with greater than 50% respiratory variability, suggesting right atrial pressure of 3 mmHg.  FINDINGS Left Ventricle: Left ventricular ejection fraction, by estimation, is 55 to 60%. The left ventricle has normal function. The left ventricle has no regional wall motion abnormalities.  The left ventricular internal cavity size was normal in size. There is no left ventricular hypertrophy. Left ventricular diastolic parameters are indeterminate.  Right Ventricle: The right ventricular size is normal. No increase in right ventricular wall thickness. Right ventricular systolic function is normal. There is normal pulmonary artery systolic pressure. The tricuspid regurgitant velocity is 2.55 m/s, and with an assumed right atrial pressure of 3 mmHg, the estimated right ventricular systolic pressure is 29.0 mmHg.  Left Atrium: Left atrial size was moderately dilated.  Right Atrium: Right atrial size was normal in size.  Pericardium: There is no evidence of pericardial effusion.  Mitral Valve: The mitral valve is normal in structure. Mild to moderate mitral annular calcification. Mild mitral valve regurgitation. No evidence of mitral valve stenosis.  Tricuspid Valve: The tricuspid valve is normal in structure. Tricuspid valve regurgitation is mild . No evidence of tricuspid stenosis.  Aortic  Valve: The aortic valve is normal in structure. Aortic valve regurgitation is not visualized. No aortic stenosis is present.  Pulmonic Valve: The pulmonic valve was normal in structure. Pulmonic valve regurgitation is not visualized. No evidence of pulmonic stenosis.  Aorta: The aortic root is normal in size and structure. There is mild dilatation of the ascending aorta, measuring 41 mm.  Venous: The inferior vena cava is normal in size with greater than 50% respiratory variability, suggesting right atrial pressure of 3 mmHg.  IAS/Shunts: No atrial level shunt detected by color flow Doppler.   LEFT VENTRICLE PLAX 2D LVIDd:         5.10 cm     Diastology LVIDs:         3.70 cm     LV e' medial:    6.31 cm/s LV PW:         1.90 cm     LV E/e' medial:  16.5 LV IVS:        2.10 cm     LV e' lateral:   8.92 cm/s LVOT diam:     2.60 cm     LV E/e' lateral: 11.7 LV SV:         97 LV SV Index:   36 LVOT Area:     5.31 cm  LV Volumes (MOD) LV vol d, MOD A2C: 74.2 ml LV vol d, MOD A4C: 82.9 ml LV vol s, MOD A2C: 49.9 ml LV vol s, MOD A4C: 45.2 ml LV SV MOD A2C:     24.3 ml LV SV MOD A4C:     82.9 ml LV SV MOD BP:      31.3 ml  RIGHT VENTRICLE RV S prime:     7.88 cm/s TAPSE (M-mode): 1.1 cm  LEFT ATRIUM              Index       RIGHT ATRIUM           Index LA diam:        5.10 cm  1.90 cm/m  RA Area:     25.50 cm LA Vol (A2C):   109.0 ml 40.62 ml/m RA Volume:   83.40 ml  31.08 ml/m LA Vol (A4C):   100.0 ml 37.26 ml/m LA Biplane Vol: 108.0 ml 40.24 ml/m AORTIC VALVE LVOT Vmax:   86.20 cm/s LVOT Vmean:  63.500 cm/s LVOT VTI:    0.182 m  AORTA Ao Root diam:  4.00 cm Ao Sinus diam: 3.50 cm Ao STJ diam:   3.6 cm Ao Asc diam:   4.05 cm  MITRAL VALVE                TRICUSPID VALVE MV Area (PHT): 3.27 cm     TR Peak grad:   26.0 mmHg MV Decel Time: 232 msec     TR Vmax:        255.00 cm/s MV E velocity: 104.00 cm/s SHUNTS Systemic VTI:  0.18 m Systemic Diam: 2.60  cm  Lamar Fitch MD Electronically signed by Lamar Fitch MD Signature Date/Time: 12/21/2020/7:38:50 PM    Final  TEE  ECHO INTRAOPERATIVE TEE 09/10/2019  Narrative *INTRAOPERATIVE TRANSESOPHAGEAL REPORT *    Patient Name:   Thomes COSMO TETREAULT Date of Exam: 09/10/2019 Medical Rec #:  [id number]       Height:       76.0 in Accession #:    [id number]      Weight:       294.0 lb Date of Birth:  1946-02-25        BSA:          2.61 m Patient Age:    73 years        BP:           174/91 mmHg Patient Gender: M               HR:           75 bpm. Exam Location:  Anesthesiology  Transesophogeal exam was perform intraoperatively during surgical procedure. Patient was closely monitored under general anesthesia during the entirety of examination.  Indications:     Coronary artery disease Sonographer:     Recardo Rosa RDCS Performing Phys: Bernardino Mango MD Diagnosing Phys: Bernardino Mango MD  Complications: No known complications during this procedure. POST-OP IMPRESSIONS Overall, there were no significant changes from pre-bypass. - Right Ventricle: The right ventricle appears unchanged from pre-bypass. - Aorta: The aorta appears unchanged from pre-bypass. - Left Atrium: The left atrial appendage has been clipped. - Aortic Valve: The aortic valve appears unchanged from pre-bypass. - Mitral Valve: There is mild regurgitation. - Tricuspid Valve: There is mild regurgitation. - Interatrial Septum: The interatrial septum appears unchanged from pre-bypass. - Interventricular Septum: The interventricular septum appears unchanged from pre-bypass. - Pericardium: The pericardium appears unchanged from pre-bypass.  PRE-OP FINDINGS Left Ventricle: The left ventricle has low normal systolic function, with an ejection fraction of 50-55%. The cavity size was moderately dilated. There is severely increased left ventricular wall thickness.  Right Ventricle: The right ventricle has normal  systolic function. The cavity was mildly enlarged. There is increased right ventricular wall thickness.  Left Atrium: Left atrial size was dilated.  Right Atrium: Right atrial size was dilated.  Interatrial Septum: No atrial level shunt detected by color flow Doppler.  Pericardium: There is no evidence of pericardial effusion.  Mitral Valve: The mitral valve is normal in structure. Mitral valve regurgitation is not visualized by color flow Doppler.  Tricuspid Valve: The tricuspid valve was normal in structure. Tricuspid valve regurgitation was not visualized by color flow Doppler.  Aortic Valve: The aortic valve is normal in structure. There is mild thickening of the aortic valve and there is mild calcification of the aortic valve. Aortic valve regurgitation is trivial by color flow Doppler. There is no evidence of aortic valve stenosis.  Pulmonic Valve: The pulmonic valve was normal in structure. Pulmonic valve regurgitation is not visualized by color flow Doppler.   Aorta: The aortic root, ascending aorta and aortic arch are normal in size and  structure. There is evidence of plaque in the descending aorta.   Bernardino Mango MD Electronically signed by Bernardino Mango MD Signature Date/Time: 09/10/2019/2:47:01 PM    Final  MONITORS  LONG TERM MONITOR (3-14 DAYS) 07/29/2019  Narrative Ryan Vaughan, DOB [birth year], MRN [id number]  EVENT MONITOR REPORT:   Patient was monitored from 07/15/2019 to 07/18/2019. Indication:                    Atrial fibrillation Ordering physician:  Lorenda Grecco JONELLE Crape, MD Referring physician:  Amily Depp JONELLE Crape, MD   Baseline rhythm: Atrial fibrillation  Minimum heart rate: 34 BPM.  Average heart rate: 68 BPM.  Maximal heart rate 155 BPM.  Atrial arrhythmia: Patient remained in atrial fibrillation throughout the monitoring  Ventricular arrhythmia: Few PVCs.  One 7 beat nonsustained ventricular tachycardia.  Conduction abnormality: None  significant  Symptoms: None significant   Conclusion: Abnormal event monitor.  Patient remained in atrial fibrillation with fairly controlled ventricular rates.  One 7 beat nonsustained ventricular tachycardia as mentioned above.  Interpreting  cardiologist: Riese Hellard JONELLE Crape, MD Date: 08/05/2019 2:57 PM           Risk Assessment/Calculations:    CHA2DS2-VASc Score = 7   This indicates a 11.2% annual risk of stroke. The patient's score is based upon: CHF History: 0 HTN History: 1 Diabetes History: 1 Stroke History: 2 Vascular Disease History: 1 Age Score: 2 Gender Score: 0         Physical Exam:   VS:  BP (!) 140/80 (BP Location: Left Arm, Patient Position: Sitting, Cuff Size: Normal)   Pulse 70   Ht 6' 4 (1.93 m)   Wt (!) 313 lb (142 kg)   SpO2 99%   BMI 38.10 kg/m    Wt Readings from Last 3 Encounters:  10/04/23 (!) 313 lb (142 kg)  09/27/23 (!) 320 lb (145.2 kg)  06/05/23 (!) 302 lb 3.2 oz (137.1 kg)    GEN: Well nourished, well developed in no acute distress NECK: No JVD; No carotid bruits CARDIAC: irregularly irregular, no murmurs, rubs, gallops RESPIRATORY:  Clear to auscultation without rales, wheezing or rhonchi  ABDOMEN: Soft, non-tender, non-distended EXTREMITIES: +1 pedal edema; No deformity   ASSESSMENT AND PLAN: .   CAD -s/p CABG x 4 in 2020 >> Lexiscan  in April 2024 was normal, low risk study.  Continue nitroglycerin  as needed, continue Coreg  12.5 mg twice daily.   Atrial fibrillation/hypercoagulable state-currently on Xarelto  20 mg daily, hemoglobin with recent drop 8.4 > 8.5 > 8.5 and currently appears to be stable, with his CHA2DS2-VASc score of 7, we will continue Xarelto  for now and continue to trend his CBCs.  Currently on Protonix  40 mg daily.  Pedal edema - will continue lasix  20 mg daily for now, repeat BMET.    Anemia -denies frank bleeding, will see GI on Monday, currently on Protonix  40 mg daily.       Dispo: BMET, follow-up in 3  months with primary cardiologist.  Signed, Delon JAYSON Hoover, NP

## 2023-10-04 ENCOUNTER — Ambulatory Visit: Payer: PPO | Attending: Cardiology | Admitting: Cardiology

## 2023-10-04 VITALS — BP 140/80 | HR 70 | Ht 76.0 in | Wt 313.0 lb

## 2023-10-04 DIAGNOSIS — I4821 Permanent atrial fibrillation: Secondary | ICD-10-CM | POA: Diagnosis not present

## 2023-10-04 DIAGNOSIS — I2510 Atherosclerotic heart disease of native coronary artery without angina pectoris: Secondary | ICD-10-CM | POA: Diagnosis not present

## 2023-10-04 DIAGNOSIS — I10 Essential (primary) hypertension: Secondary | ICD-10-CM | POA: Diagnosis not present

## 2023-10-04 DIAGNOSIS — Z951 Presence of aortocoronary bypass graft: Secondary | ICD-10-CM | POA: Diagnosis not present

## 2023-10-04 DIAGNOSIS — D6859 Other primary thrombophilia: Secondary | ICD-10-CM | POA: Diagnosis not present

## 2023-10-04 NOTE — Patient Instructions (Signed)
 Medication Instructions:  Your physician recommends that you continue on your current medications as directed. Please refer to the Current Medication list given to you today.  *If you need a refill on your cardiac medications before your next appointment, please call your pharmacy*   Lab Work: Your physician recommends that you have labs done in the office today. Your test included  basic metabolic panel.  If you have labs (blood work) drawn today and your tests are completely normal, you will receive your results only by: MyChart Message (if you have MyChart) OR A paper copy in the mail If you have any lab test that is abnormal or we need to change your treatment, we will call you to review the results.   Testing/Procedures: None Ordered   Follow-Up: At [facility], you and your health needs are our priority.  As part of our continuing mission to provide you with exceptional heart care, we have created designated Provider Care Teams.  These Care Teams include your primary Cardiologist (physician) and Advanced Practice Providers (APPs -  Physician Assistants and Nurse Practitioners) who all work together to provide you with the care you need, when you need it.  We recommend signing up for the patient portal called MyChart.  Sign up information is provided on this After Visit Summary.  MyChart is used to connect with patients for Virtual Visits (Telemedicine).  Patients are able to view lab/test results, encounter notes, upcoming appointments, etc.  Non-urgent messages can be sent to your provider as well.   To learn more about what you can do with MyChart, go to [email or web address].    Your next appointment:   3 month follow up

## 2023-10-05 LAB — BASIC METABOLIC PANEL WITH GFR
BUN/Creatinine Ratio: 19 (ref 10–24)
BUN: 20 mg/dL (ref 8–27)
CO2: 26 mmol/L (ref 20–29)
Calcium: 9 mg/dL (ref 8.6–10.2)
Chloride: 103 mmol/L (ref 96–106)
Creatinine, Ser: 1.03 mg/dL (ref 0.76–1.27)
Glucose: 124 mg/dL — ABNORMAL HIGH (ref 70–99)
Potassium: 4.1 mmol/L (ref 3.5–5.2)
Sodium: 139 mmol/L (ref 134–144)
eGFR: 75 mL/min/1.73

## 2023-10-07 ENCOUNTER — Encounter: Payer: Self-pay | Admitting: Physician Assistant

## 2023-10-07 ENCOUNTER — Ambulatory Visit: Payer: PPO | Admitting: Physician Assistant

## 2023-10-07 ENCOUNTER — Telehealth: Payer: Self-pay | Admitting: Emergency Medicine

## 2023-10-07 ENCOUNTER — Other Ambulatory Visit (INDEPENDENT_AMBULATORY_CARE_PROVIDER_SITE_OTHER): Payer: PPO

## 2023-10-07 VITALS — BP 130/70 | HR 74 | Ht 76.0 in | Wt 318.0 lb

## 2023-10-07 DIAGNOSIS — Z860101 Personal history of adenomatous and serrated colon polyps: Secondary | ICD-10-CM | POA: Diagnosis not present

## 2023-10-07 DIAGNOSIS — D649 Anemia, unspecified: Secondary | ICD-10-CM | POA: Diagnosis not present

## 2023-10-07 DIAGNOSIS — I4821 Permanent atrial fibrillation: Secondary | ICD-10-CM

## 2023-10-07 DIAGNOSIS — R195 Other fecal abnormalities: Secondary | ICD-10-CM

## 2023-10-07 DIAGNOSIS — K219 Gastro-esophageal reflux disease without esophagitis: Secondary | ICD-10-CM

## 2023-10-07 DIAGNOSIS — Z7901 Long term (current) use of anticoagulants: Secondary | ICD-10-CM

## 2023-10-07 LAB — CBC WITH DIFFERENTIAL/PLATELET
Basophils Absolute: 0.1 10*3/uL (ref 0.0–0.1)
Basophils Relative: 1.4 % (ref 0.0–3.0)
Eosinophils Absolute: 0.2 10*3/uL (ref 0.0–0.7)
Eosinophils Relative: 4.1 % (ref 0.0–5.0)
HCT: 27.3 % — ABNORMAL LOW (ref 39.0–52.0)
Hemoglobin: 8.9 g/dL — ABNORMAL LOW (ref 13.0–17.0)
Lymphocytes Relative: 23.1 % (ref 12.0–46.0)
Lymphs Abs: 1.3 10*3/uL (ref 0.7–4.0)
MCHC: 32.4 g/dL (ref 30.0–36.0)
MCV: 94.2 fL (ref 78.0–100.0)
Monocytes Absolute: 0.6 10*3/uL (ref 0.1–1.0)
Monocytes Relative: 10.1 % (ref 3.0–12.0)
Neutro Abs: 3.5 10*3/uL (ref 1.4–7.7)
Neutrophils Relative %: 61.3 % (ref 43.0–77.0)
Platelets: 168 10*3/uL (ref 150.0–400.0)
RBC: 2.9 Mil/uL — ABNORMAL LOW (ref 4.22–5.81)
RDW: 14.6 % (ref 11.5–15.5)
WBC: 5.8 10*3/uL (ref 4.0–10.5)

## 2023-10-07 LAB — IBC + FERRITIN
Ferritin: 16.3 ng/mL — ABNORMAL LOW (ref 22.0–322.0)
Iron: 22 ug/dL — ABNORMAL LOW (ref 42–165)
Saturation Ratios: 5.5 % — ABNORMAL LOW (ref 20.0–50.0)
TIBC: 403.2 ug/dL (ref 250.0–450.0)
Transferrin: 288 mg/dL (ref 212.0–360.0)

## 2023-10-07 MED ORDER — FUROSEMIDE 20 MG PO TABS: 20.0000 mg | ORAL_TABLET | Freq: Every day | ORAL | 3 refills | Status: AC

## 2023-10-07 NOTE — Progress Notes (Signed)
 Subjective:    Patient ID: Ryan Vaughan, male    DOB: [birth year], 78 y.o.   MRN: [id number]  HPI [name] is a pleasant 78 year old white male established with Dr. Charlanne who comes in today for follow-up after a recent ER visit on 09/27/2023.  He had presented with complaints of fatigue and generally feeling poorly in addition had developed increased in bilateral lower extremity edema.  Labs revealed a hemoglobin of 8.4, down from hemoglobin of 11.7/hematocrit 35.4 in August 2024.  Stool was also documented heme positive on rectal exam without any overt melena or hematochezia. Patient is on Xarelto  and aspirin . He also had increase in his dose of daily Lasix  at the time of that visit. 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 Is on chronic Protonix  40 mg daily for GERD symptoms. He had undergone EGD in July 2024 with finding of moderate gastritis and a wide open nonobstructing duodenal stricture was noted.  Procedure done at that time for epigastric pain and weight loss.  He is being followed by vascular surgery and had a CTA around that same time showing chronic mesenteric stenosis. His last colonoscopy was done in August 2022 with finding of multiple diverticuli, internal hemorrhoids and he had 2 small sessile polyps removed 4 to 6 mm.  Both of these were tubular adenomas. Patient himself says that he has not been having any abdominal pain or discomfort, no nausea or vomiting, no heartburn or indigestion symptoms on Protonix  no dysphagia.  He has not noticed any change in his bowel habits or any melena or hematochezia.   Review of Systems Pertinent positive and negative review of systems were noted in the above HPI section.  All other review  of systems was otherwise negative.   Outpatient Encounter Medications as of 10/07/2023  Medication Sig   acetaminophen  (TYLENOL ) 500 MG tablet Take 500-1,500 mg by mouth every 8 (eight) hours as needed for moderate pain or headache.   aspirin  EC 81 MG tablet Take 81 mg by mouth daily.   atorvastatin  (LIPITOR ) 80 MG tablet TAKE 1 TABLET BY MOUTH ONCE DAILY   carvedilol  (COREG ) 12.5 MG tablet Take 1 tablet (12.5 mg total) by mouth 2 (two) times daily.   cyanocobalamin  (VITAMIN B12) 1000 MCG tablet Take 1,000 mcg by mouth daily.   donepezil (ARICEPT) 10 MG tablet Take 10 mg by mouth at bedtime.   FLUoxetine (PROZAC) 20 MG tablet Take 20 mg by mouth daily.   gabapentin (NEURONTIN) 800 MG tablet Take 800 mg by mouth as needed for pain.   glimepiride  (AMARYL ) 4 MG tablet Take 4 mg by mouth 2 (two) times daily.   LANTUS SOLOSTAR 100 UNIT/ML Solostar Pen Inject 25 Units into the skin at bedtime.   lisinopril  (ZESTRIL ) 10 MG tablet Take 1 tablet (10 mg total) by mouth daily.   memantine (NAMENDA) 5 MG tablet Take 5 mg by mouth 2 (two) times daily.   pantoprazole  (PROTONIX ) 40 MG tablet Take 1 tablet (40 mg total) by mouth daily.   rivaroxaban  (XARELTO ) 20 MG TABS tablet Take 1 tablet (20 mg total) by mouth daily with supper.   Vitamin D, Ergocalciferol, (DRISDOL) 1.25 MG (50000 UT) CAPS capsule Take 50,000 Units by mouth every Friday.    [DISCONTINUED] furosemide  (LASIX ) 20 MG  tablet Take 1 tablet (20 mg total) by mouth daily.   nitroGLYCERIN  (NITROSTAT ) 0.4 MG SL tablet Place 1 tablet (0.4 mg total) under the tongue every 5 (five) minutes as needed for chest pain. (Patient not taking: Reported on 10/07/2023)   [DISCONTINUED] furosemide  (LASIX ) 20 MG tablet Take 1 tablet (20 mg total) by mouth daily. (Patient not taking: Reported on 10/07/2023)   No facility-administered encounter medications on file as of 10/07/2023.   Allergies  Allergen Reactions   Penicillins Rash    Did it involve swelling of the  face/tongue/throat, SOB, or low BP? No Did it involve sudden or severe rash/hives, skin peeling, or any reaction on the inside of your mouth or nose? No Did you need to seek medical attention at a hospital or doctor's office? No When did it last happen?      2-3 years If all above answers are "NO", may proceed with cephalosporin use.    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   Hammertoes of both feet 11/14/2022   Overgrown toenails 11/14/2022   Aneurysm of ascending aorta (HCC) 01/05/2021   Permanent atrial fibrillation (HCC) 12/02/2020   Atrial fibrillation (HCC)    Carotid artery disease (HCC)    Coronary artery disease    Diabetes mellitus without complication (HCC)    Hearing loss    Hypertension    TIA (transient ischemic attack)    CAD (coronary artery disease) 11/05/2019   S/P CABG x 4 09/10/2019   Diabetes mellitus due to underlying condition with unspecified complications (HCC) 08/25/2018   Gout 01/24/2018   Osteoarthritis of left knee 01/24/2018   Carotid artery stenosis 05/14/2017   H/O carotid endarterectomy 05/14/2017   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   Chronic atrial fibrillation (HCC) 05/04/2015   Dyslipidemia 05/04/2015   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   Chronic anticoagulation 04/08/2014   Essential hypertension 04/08/2014   Social History   Socioeconomic History   Marital status: Married    Spouse name: Not on file   Number of children: 2   Years of education: Not on file   Highest education level: Not on file  Occupational History   Occupation: Retired  Tobacco Use   Smoking status: Never   Smokeless tobacco: Never  Vaping Use   Vaping  status: Never Used  Substance and Sexual Activity   Alcohol use: No   Drug use: No   Sexual activity: Not on file  Other Topics Concern   Not on file  Social History Narrative   Right handed    Lives with family    Social Drivers of Health   Financial Resource Strain: Low Risk  (01/22/2023)   Received from [facility], [facility]   Overall Financial Resource Strain (CARDIA)    Difficulty of Paying Living Expenses: Not hard at all  Food Insecurity: No Food Insecurity (01/22/2023)   Received from [facility], [facility]   Hunger Vital Sign    Worried About Running Out of Food in the Last Year: Never true    Ran Out of Food in the Last Year: Never true  Transportation Needs: No Transportation Needs (01/22/2023)   [facility]   PRAPARE - Transportation    Lack of Transportation (Medical): No    Lack of Transportation (Non-Medical): No  Physical Activity: Not on file  Stress: Not on file  Social Connections: Unknown (01/16/2023)   Received from [facility], [facility]   Social Network    Social Network: Not on file  Intimate Partner Violence: Unknown (01/16/2023)   Received from [facility], [facility]   HITS    Physically Hurt: Not on file    Insult or Talk Down To: Not on file    Threaten Physical Harm: Not on file    Scream or Curse: Not on file    Mr. Vanwyk family history includes Hypertension in his mother.      Objective:    Vitals:   10/07/23 0949  BP: 130/70  Pulse: 74    Physical Exam. Well-developed well-nourished elderly WM  in no acute distress.  Accompanied by his wife height, Weight 318, BMI 38.7  HEENT; nontraumatic normocephalic, EOMI, PE R LA, sclera anicteric. Oropharynx; not examined today Neck; supple, no JVD Cardiovascular; regular rate and rhythm with S1-S2, no murmur rub or gallop Pulmonary; Clear bilaterally Abdomen; soft, obese,, nondistended, no palpable mass or hepatosplenomegaly,  bowel sounds are active Rectal; not done today Skin; benign exam, no jaundice rash or appreciable lesions Extremities; no clubbing cyanosis or edema skin warm and dry Neuro/Psych; alert and oriented x4, grossly nonfocal mood and affect appropriate        Assessment & Plan:   #11 78 year old white male with new anemia, and heme positive stool without any overt melena or hematochezia.  This was found in setting of chronic aspirin  and Xarelto . Suspect chronic slow GI blood loss over several months, with hemoglobin 11.7 documented in August 2024. Etiology of slow GI blood loss is not clear, he just had EGD in July 2024 as above with only significant finding of moderate gastritis, and is up-to-date with colonoscopy last done August 2022 with 2 small polyps removed tubular adenomas, and noting diverticulosis and internal hemorrhoids. Will need to rule out small bowel source for anemia i.e. AVMs or other mucosal abnormality.  #2 chronic GERD-stable on Protonix  3.  History of adenomatous colon polyps-up-to-date with colonoscopy last done August 2022  #4 permanent atrial fibrillation-on Xarelto  5.  Coronary artery disease status post CABG x 4 6.  Diabetes mellitus 7.  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  History of TIA 9.  Peripheral vascular disease status post carotid endarterectomy 10.  Ascending aortic aneurysm  Plan; repeat CBC today, check iron studies Continue Protonix  40 mg p.o. every morning AC breakfast Patient will be scheduled for capsule endoscopy.  I discussed indications risks and benefits in detail with the patient, and he is agreeable to proceed. If capsule endoscopy is unrevealing then may need to consider repeat colonoscopy with Dr. Charlanne. For now he continues on baby aspirin  and Xarelto  daily  Grisell Bissette GORMAN Corti PA-C 10/07/2023   Cc: Keren Vicenta BRAVO, MD

## 2023-10-07 NOTE — Telephone Encounter (Signed)
-----   Message from Flossie Dibble sent at 10/05/2023  5:23 PM EST ----- Labs look good. Do not think he needs potassium at this time.  Repeat CBC in 2 weeks.

## 2023-10-07 NOTE — Telephone Encounter (Signed)
Viewed in MyChart Routed to PCP  

## 2023-10-07 NOTE — Patient Instructions (Signed)
 _______________________________________________________  If your blood pressure at your visit was 140/90 or greater, please contact your primary care physician to follow up on this.  _______________________________________________________  If you are age 77 or older, your body mass index should be between 23-30. Your Body mass index is 38.71 kg/m. If this is out of the aforementioned range listed, please consider follow up with your Primary Care Provider.  If you are age 36 or younger, your body mass index should be between 19-25. Your Body mass index is 38.71 kg/m. If this is out of the aformentioned range listed, please consider follow up with your Primary Care Provider.   ________________________________________________________  The [facility] providers would like to encourage you to use MYCHART to communicate with providers for non-urgent requests or questions.  Due to long hold times on the telephone, sending your provider a message by [facility] may be a faster and more efficient way to get a response.  Please allow 48 business hours for a response.  Please remember that this is for non-urgent requests.  _______________________________________________________  Your provider has requested that you go to the basement level for lab work before leaving today. Press B on the elevator. The lab is located at the first door on the left as you exit the elevator.  Continue protonix   Capsule appointment 10-23-23  CAPSULE ENDOSCOPY PATIENT INSTRUCTION Ryan Vaughan [birth year] [id number]   10-16-23 Seven (7) days prior to capsule endoscopy stop taking iron supplements and carafate.  10-21-23 Two (2) days prior to capsule endoscopy stop taking aspirin  or any arthritis drugs.  10-22-23 Day before capsule endoscopy purchase a 238 gram bottle of Miralax from the laxative section of your drug store, and a 32 oz. bottle of Gatorade (no red).    10-22-23 One (1) day prior to capsule  endoscopy: Stop smoking. Eat a regular diet until 12:00 Noon. After 12:00 Noon take only the following: Black coffee  Jell-O (no fruit or red Jell-o) Water   Bouillon (chicken or beef) 7-Up   Cranberry Juice Tea   Kool-Aid Popsicle (not red) Sprite   Coke Ginger Ale  Pepsi Mountain Dew Gatorade At 6:00 pm the evening before your appointment, drink 7 capfuls (105 grams) of Miralax with 32 oz. Gatorade. Drink 8 oz every 15 minutes until gone. Nothing to eat or drink after midnight except medications with a sip of water.  10-23-23 Day of capsule endoscopy:  No medications for 2 hours prior to your test.  Please arrive at [facility]  3rd floor patient registration area by 830am on: 10-23-23.   For any questions: Call [facility] at [phone] and ask to speak with one of the capsule endoscopy nurses.  YOU WILL NEED TO RETURN THE EQUIPMENT AT 4 PM ON THE DAY OF THE PROCEDURE.  PLEASE KEEP THIS IN MIND WHEN SCHEDULING.   The above instructions have been reviewed and explained to me by________________   Patient signature:_________________________________________     Date:________________  Small Bowel Capsule Endoscopy  What you should know: Small Bowel capsule endoscopy is a procedure that takes pictures of the inside of your small intestine (bowel).  Your small bowel connects to your stomach on one end, and your large bowel (colon) on the other.  A capsule endoscopy is done by swallowing a pill size camera.  The capsule moves through your stomach and into your small bowel, where pictures are taken.   You may need a small bowel capsule endoscopy if you have symptoms, such as  blood in your stool, chronic stomach pain, and diarrhea.  The pictures may show if you have growths, swelling, and bleeding area in you small bowel.  A capsule endoscopy may also show if diseases such as Crohn's or celiac disease are causing your symptoms.  Having a small bowel capsule endoscopy may help  you and your caregiver learn the cause of your symptoms.  Learning what is causing your symptoms allows you to receive needed treatment and prevent further problems. Risks: You may have stomach pain during your procedure.   The pictures taken by the capsule may not be clear.   The pictures may not show the cause of your symptoms.   You may need another endoscopy procedure.   The capsule may get trapped in your esophagus or intestines. You may need surgery or additional procedures to remove the capsule from your body.    Before your procedure: You will be instructed to stop certain prescription medications or over- the -counter medications prior to the procedure.   The day before your scheduled appointment you will need to be on a restricted diet and will need to drink a bowel prep that will clean out your bowels.   The day of the procedure: You may drive yourself to the procedure.   You will need to plan on 2 trips to the office on the day of the procedure. Morning: Plan to be at the office about 45 minutes. The morning of the procedure a sensor belt and recorder will be placed on you.  You will wear this for 8 hours.  (The sensor belt transfers pictures of your small bowel to the recorder.)   You will be given a pill-sized capsule endoscope to swallow.  Once you swallow the capsule it will travel through your body the same way food does, constantly taking pictures along the way.  The capsule takes 2-3 pictures a second.   Once you have left the office you may go about your normal day with a few exceptions: You may not go near a MRI machine or a radio or television towers; You need to avoid other patients having capsule endoscopy; You will be given a written diet to follow for the day.  Afternoon: You will need to be return to the office at your designated time. The sensors belt will be removed You will need to be at the office about 15 minutes.

## 2023-10-18 DIAGNOSIS — I4821 Permanent atrial fibrillation: Secondary | ICD-10-CM | POA: Diagnosis not present

## 2023-10-19 LAB — CBC
Hematocrit: 26.6 % — ABNORMAL LOW (ref 37.5–51.0)
Hemoglobin: 8.4 g/dL — ABNORMAL LOW (ref 13.0–17.7)
MCH: 29.9 pg (ref 26.6–33.0)
MCHC: 31.6 g/dL (ref 31.5–35.7)
MCV: 95 fL (ref 79–97)
Platelets: 192 x10E3/uL (ref 150–450)
RBC: 2.81 x10E6/uL — ABNORMAL LOW (ref 4.14–5.80)
RDW: 14.4 % (ref 11.6–15.4)
WBC: 6 x10E3/uL (ref 3.4–10.8)

## 2023-10-23 ENCOUNTER — Telehealth: Payer: Self-pay | Admitting: Physician Assistant

## 2023-10-23 NOTE — Telephone Encounter (Signed)
Patient needing to reschedule today's apt due to inclement  weather conditions in the area. Please advise.   Thank you

## 2023-10-23 NOTE — Telephone Encounter (Signed)
Attempted to reach pt and voice mail not activated and alternate number rings with no answer  Will attempt later

## 2023-10-23 NOTE — Telephone Encounter (Signed)
Please let pt know his HGB is stable at 8.9,I checked Iron studies on him and Iron is Low- would like him to start FeSo4 325 mg BID - he can wait until after capsule study ( as usually hold Iron) We should repeat a cbc in 2weeks -can do when comes on the day for Capsule

## 2023-10-25 NOTE — Telephone Encounter (Signed)
Called the spouse (care partner) Rescheduled the patient to 10/29/23 for the capsule study. Reviewed the instructions. Confirmed arrival time. Patient will get the labs after capsule ingest.

## 2023-10-28 NOTE — Telephone Encounter (Signed)
Inbound call from patient stating patient has taken medication that were instructed to be held. States patient would not be able to come in for Capsule Endo tomorrow 1/28. Requesting a call back. Please advise, thank you.

## 2023-10-28 NOTE — Telephone Encounter (Signed)
Mrs. Obar did not hold the patient's iron supplement. She agrees to bring him in for a CBC this week. Capsule endoscopy rescheduled to 11/13/23.

## 2023-10-29 NOTE — Progress Notes (Signed)
 " Cardiology Office Note:  .   Date:  10/31/2023  ID:  Ryan Vaughan, DOB [birth year], MRN [id number] PCP: Keren Vicenta BRAVO, MD  [facility] Providers Cardiologist:  Ryan Fiallos JONELLE Crape, MD    History of Present Illness: .   Ryan Vaughan is a 78 y.o. male with a past medical history of chronic atrial fibrillation, hypertension, carotid artery stenosis s/p carotid endarterectomy--follows with [facility] in [facility], renal artery aneurysm, CAD s/p CABG x 4, history of TIA, aneurysm of ascending aorta, emphysema, DM 2, dyslipidemia.  01/23/2023 Lexiscan  normal, low risk study, moderately reduced EF at 43% 12/21/2020 echo EF 55 to 60%, mild MR, mild dilatation ascending aorta 41 mm 09/20/2019 CABG x 4 LIMA to LAD, SVG to diagonal, sequential SVG to distal RCA and PL, clipping of atrial appendage 08/28/2019 left heart cath mid LAD 85% stenotic, circumflex 60 to 70% narrowing, mid to distal RCA 90 to 95% stenosis >> CVTS consultation  Most recently he was evaluated by Dr. Crape on 04/17/2023, he was stable from a cardiac perspective and advised to follow-up in 9 months.  He was evaluated in the emergency department on 09/27/2023 for pedal edema and hemoglobin of 8.4.  He was given Lasix  and advised follow-up with cardiology.  Evaluated 10/04/2023 following hospitalization, up, plans to see GI for evaluation of his anemia, he had not noticed any overt bleeding.  Evaluated by GI on 10/07/2023--plans for an upcoming capsule endoscopy.  He presents today accompanied by his wife with concerns of shortness of breath--he offers no formal complaints however she has noticed that he seems more winded than normal.  Also concerned with labile blood pressure readings.  He will see GI in the near future for upcoming capsule endoscopy, still has not noticed and hematuria or hematochezia.  His blood pressure is elevated in the office today however it appears at other times it is well-controlled at home, possibly even  too low. He denies chest pain, palpitations, dyspnea, pnd, orthopnea, n, v, dizziness, syncope, edema, weight gain, or early satiety.    ROS: Review of Systems  Constitutional:  Positive for malaise/fatigue.  Respiratory:  Positive for shortness of breath (wife reports).   Cardiovascular:  Positive for leg swelling.  All other systems reviewed and are negative.    Studies Reviewed: .        Cardiac Studies & Procedures   CARDIAC CATHETERIZATION  CARDIAC CATHETERIZATION 08/28/2019  Narrative  Widely patent left main coronary artery  Mid LAD contains eccentric 85% stenosis after the first diagonal.  The first diagonal is small and contains ostial 90% stenosis.  A large ramus intermedius arises from the distal left main but is free of significant obstruction.  Circumflex gives origin to a small first obtuse marginal, in the mid vessel there is eccentric 60 to 70% narrowing.  2 smaller obtuse marginals arise distally and supplies collaterals to a large right coronary left ventricular branch which apparently has been totally occluded for greater than 10 years.  The right coronary artery is large.  It is totally occluded in the continuation after a small left ventricular branch arises.  The distal vessel is supplied by collaterals from left circumflex to the right coronary.  The mid to distal right coronary contains 90 to 95% eccentric angulated stenosis that enters a fusiform aneurysm =/> 4.5 mm in diameter).  The native vessel pre and post aneurysm is 3.5 mm.  The PDA is large and graftable.  Overall normal left ventricular function  with EF 45 to 50%.  Normal LVEDP.  RECOMMENDATIONS:   TCTS consultation for consideration of multivessel grafting.  Significant blood pressure elevation was present this morning because no antihypertensive therapy was taken in the past 24 hours.  IV labetalol  was given to improve blood pressure.  He should take his blood pressure medication as soon as  he arrives home.  Xarelto  should be resumed later this evening or in a.m. depending upon when he ordinarily takes it.  He should not take the Xarelto  before he has had antihypertensive therapy.  Findings Coronary Findings Diagnostic  Dominance: Right  Left Anterior Descending Mid LAD lesion is 80% stenosed.  First Diagonal Branch Vessel is small in size. 1st Diag lesion is 80% stenosed.  Left Circumflex Prox Cx to Mid Cx lesion is 65% stenosed.  First Obtuse Marginal Branch Vessel is small in size.  Right Coronary Artery Prox RCA to Mid RCA lesion is 55% stenosed. Mid RCA lesion is 95% stenosed. Non-stenotic Mid RCA to Dist RCA lesion.  Right Posterior Atrioventricular Artery RPAV lesion is 100% stenosed.  Third Right Posterolateral Branch Vessel is large in size. Collaterals 3rd RPL filled by collaterals from 3rd Mrg.  Intervention  No interventions have been documented.   STRESS TESTS  MYOCARDIAL PERFUSION IMAGING 01/23/2023  Narrative   The study is normal. The study is low risk.   Left ventricular function is abnormal. Global function is mioderately reduced. Nuclear stress EF: 43 %. The left ventricular ejection fraction is moderately decreased (30-44%). End diastolic cavity size is normal.   Prior study available for comparison from 12/14/2020.  ECHOCARDIOGRAM  ECHOCARDIOGRAM COMPLETE 12/21/2020  Narrative ECHOCARDIOGRAM REPORT    Patient Name:   Ryan Vaughan Date of Exam: 12/21/2020 Medical Rec #:  [id number]       Height:       77.0 in Accession #:    [id number]      Weight:       307.0 lb Date of Birth:  30-Nov-1945        BSA:          2.684 m Patient Age:    74 years        BP:           161/100 mmHg Patient Gender: M               HR:           78 bpm. Exam Location:  [facility]  Procedure: 2D Echo  Indications:    Decreased cardiac ejection fraction [R93.1 (ICD-10-CM)]  History:        Patient has prior history of Echocardiogram examinations,  most recent 08/19/2019. Prior CABG, Carotid Disease and TIA; Risk Factors:Hypertension, Diabetes and Dyslipidemia.  Sonographer:    Lynwood Silvas Referring Phys: [facility]  IMPRESSIONS   1. Left ventricular ejection fraction, by estimation, is 55 to 60%. The left ventricle has normal function. The left ventricle has no regional wall motion abnormalities. Left ventricular diastolic parameters are indeterminate. 2. Right ventricular systolic function is normal. The right ventricular size is normal. There is normal pulmonary artery systolic pressure. 3. Left atrial size was moderately dilated. 4. The mitral valve is normal in structure. Mild mitral valve regurgitation. No evidence of mitral stenosis. 5. The aortic valve is normal in structure. Aortic valve regurgitation is not visualized. No aortic stenosis is present. 6. There is mild dilatation of the ascending aorta, measuring 41 mm. 7. The inferior vena cava is  normal in size with greater than 50% respiratory variability, suggesting right atrial pressure of 3 mmHg.  FINDINGS Left Ventricle: Left ventricular ejection fraction, by estimation, is 55 to 60%. The left ventricle has normal function. The left ventricle has no regional wall motion abnormalities. The left ventricular internal cavity size was normal in size. There is no left ventricular hypertrophy. Left ventricular diastolic parameters are indeterminate.  Right Ventricle: The right ventricular size is normal. No increase in right ventricular wall thickness. Right ventricular systolic function is normal. There is normal pulmonary artery systolic pressure. The tricuspid regurgitant velocity is 2.55 m/s, and with an assumed right atrial pressure of 3 mmHg, the estimated right ventricular systolic pressure is 29.0 mmHg.  Left Atrium: Left atrial size was moderately dilated.  Right Atrium: Right atrial size was normal in size.  Pericardium: There is no evidence of  pericardial effusion.  Mitral Valve: The mitral valve is normal in structure. Mild to moderate mitral annular calcification. Mild mitral valve regurgitation. No evidence of mitral valve stenosis.  Tricuspid Valve: The tricuspid valve is normal in structure. Tricuspid valve regurgitation is mild . No evidence of tricuspid stenosis.  Aortic Valve: The aortic valve is normal in structure. Aortic valve regurgitation is not visualized. No aortic stenosis is present.  Pulmonic Valve: The pulmonic valve was normal in structure. Pulmonic valve regurgitation is not visualized. No evidence of pulmonic stenosis.  Aorta: The aortic root is normal in size and structure. There is mild dilatation of the ascending aorta, measuring 41 mm.  Venous: The inferior vena cava is normal in size with greater than 50% respiratory variability, suggesting right atrial pressure of 3 mmHg.  IAS/Shunts: No atrial level shunt detected by color flow Doppler.   LEFT VENTRICLE PLAX 2D LVIDd:         5.10 cm     Diastology LVIDs:         3.70 cm     LV e' medial:    6.31 cm/s LV PW:         1.90 cm     LV E/e' medial:  16.5 LV IVS:        2.10 cm     LV e' lateral:   8.92 cm/s LVOT diam:     2.60 cm     LV E/e' lateral: 11.7 LV SV:         97 LV SV Index:   36 LVOT Area:     5.31 cm  LV Volumes (MOD) LV vol d, MOD A2C: 74.2 ml LV vol d, MOD A4C: 82.9 ml LV vol s, MOD A2C: 49.9 ml LV vol s, MOD A4C: 45.2 ml LV SV MOD A2C:     24.3 ml LV SV MOD A4C:     82.9 ml LV SV MOD BP:      31.3 ml  RIGHT VENTRICLE RV S prime:     7.88 cm/s TAPSE (M-mode): 1.1 cm  LEFT ATRIUM              Index       RIGHT ATRIUM           Index LA diam:        5.10 cm  1.90 cm/m  RA Area:     25.50 cm LA Vol (A2C):   109.0 ml 40.62 ml/m RA Volume:   83.40 ml  31.08 ml/m LA Vol (A4C):   100.0 ml 37.26 ml/m LA Biplane Vol: 108.0 ml 40.24 ml/m AORTIC VALVE LVOT  Vmax:   86.20 cm/s LVOT Vmean:  63.500 cm/s LVOT VTI:    0.182  m  AORTA Ao Root diam:  4.00 cm Ao Sinus diam: 3.50 cm Ao STJ diam:   3.6 cm Ao Asc diam:   4.05 cm  MITRAL VALVE                TRICUSPID VALVE MV Area (PHT): 3.27 cm     TR Peak grad:   26.0 mmHg MV Decel Time: 232 msec     TR Vmax:        255.00 cm/s MV E velocity: 104.00 cm/s SHUNTS Systemic VTI:  0.18 m Systemic Diam: 2.60 cm  Ryan Fitch MD Electronically signed by Ryan Fitch MD Signature Date/Time: 12/21/2020/7:38:50 PM    Final  TEE  ECHO INTRAOPERATIVE TEE 09/10/2019  Narrative *INTRAOPERATIVE TRANSESOPHAGEAL REPORT *    Patient Name:   Ryan Vaughan Date of Exam: 09/10/2019 Medical Rec #:  [id number]       Height:       76.0 in Accession #:    [id number]      Weight:       294.0 lb Date of Birth:  07/27/1946        BSA:          2.61 m Patient Age:    73 years        BP:           174/91 mmHg Patient Gender: M               HR:           75 bpm. Exam Location:  Anesthesiology  Transesophogeal exam was perform intraoperatively during surgical procedure. Patient was closely monitored under general anesthesia during the entirety of examination.  Indications:     Coronary artery disease Sonographer:     Recardo Rosa RDCS Performing Phys: Ryan Mango MD Diagnosing Phys: Ryan Mango MD  Complications: No known complications during this procedure. POST-OP IMPRESSIONS Overall, there were no significant changes from pre-bypass. - Right Ventricle: The right ventricle appears unchanged from pre-bypass. - Aorta: The aorta appears unchanged from pre-bypass. - Left Atrium: The left atrial appendage has been clipped. - Aortic Valve: The aortic valve appears unchanged from pre-bypass. - Mitral Valve: There is mild regurgitation. - Tricuspid Valve: There is mild regurgitation. - Interatrial Septum: The interatrial septum appears unchanged from pre-bypass. - Interventricular Septum: The interventricular septum appears unchanged from pre-bypass. -  Pericardium: The pericardium appears unchanged from pre-bypass.  PRE-OP FINDINGS Left Ventricle: The left ventricle has low normal systolic function, with an ejection fraction of 50-55%. The cavity size was moderately dilated. There is severely increased left ventricular wall thickness.  Right Ventricle: The right ventricle has normal systolic function. The cavity was mildly enlarged. There is increased right ventricular wall thickness.  Left Atrium: Left atrial size was dilated.  Right Atrium: Right atrial size was dilated.  Interatrial Septum: No atrial level shunt detected by color flow Doppler.  Pericardium: There is no evidence of pericardial effusion.  Mitral Valve: The mitral valve is normal in structure. Mitral valve regurgitation is not visualized by color flow Doppler.  Tricuspid Valve: The tricuspid valve was normal in structure. Tricuspid valve regurgitation was not visualized by color flow Doppler.  Aortic Valve: The aortic valve is normal in structure. There is mild thickening of the aortic valve and there is mild calcification of the aortic valve. Aortic valve regurgitation is trivial by color flow  Doppler. There is no evidence of aortic valve stenosis.  Pulmonic Valve: The pulmonic valve was normal in structure. Pulmonic valve regurgitation is not visualized by color flow Doppler.   Aorta: The aortic root, ascending aorta and aortic arch are normal in size and structure. There is evidence of plaque in the descending aorta.   Ryan Mango MD Electronically signed by Ryan Mango MD Signature Date/Time: 09/10/2019/2:47:01 PM    Final  MONITORS  LONG TERM MONITOR (3-14 DAYS) 07/29/2019  Narrative Ryan Vaughan, DOB 1946/07/30, MRN [id number]  EVENT MONITOR REPORT:   Patient was monitored from 07/15/2019 to 07/18/2019. Indication:                    Atrial fibrillation Ordering physician:  Ryan Deerman JONELLE Crape, MD Referring physician:  Bridney Guadarrama JONELLE Crape,  MD   Baseline rhythm: Atrial fibrillation  Minimum heart rate: 34 BPM.  Average heart rate: 68 BPM.  Maximal heart rate 155 BPM.  Atrial arrhythmia: Patient remained in atrial fibrillation throughout the monitoring  Ventricular arrhythmia: Few PVCs.  One 7 beat nonsustained ventricular tachycardia.  Conduction abnormality: None significant  Symptoms: None significant   Conclusion: Abnormal event monitor.  Patient remained in atrial fibrillation with fairly controlled ventricular rates.  One 7 beat nonsustained ventricular tachycardia as mentioned above.  Interpreting  cardiologist: Koltyn Kelsay JONELLE Crape, MD Date: 08/05/2019 2:57 PM           Risk Assessment/Calculations:    CHA2DS2-VASc Score = 7   This indicates a 11.2% annual risk of stroke. The patient's score is based upon: CHF History: 0 HTN History: 1 Diabetes History: 1 Stroke History: 2 Vascular Disease History: 1 Age Score: 2 Gender Score: 0         Physical Exam:   VS:  BP (!) 161/88   Pulse 71    Wt Readings from Last 3 Encounters:  10/07/23 (!) 318 lb (144.2 kg)  10/04/23 (!) 313 lb (142 kg)  09/27/23 (!) 320 lb (145.2 kg)    GEN: Well nourished, well developed in no acute distress NECK: No JVD; No carotid bruits CARDIAC: irregularly irregular, no murmurs, rubs, gallops RESPIRATORY:  Clear to auscultation without rales, wheezing or rhonchi  ABDOMEN: Soft, non-tender, non-distended EXTREMITIES:  no pedal edema; No deformity   ASSESSMENT AND PLAN: .   CAD -s/p CABG x 4 in 2020 >> Lexiscan  in April 2024 was normal, low risk study.  Stable with no anginal symptoms. No indication for ischemic evaluation.  Continue nitroglycerin  as needed, continue Coreg  12.5 mg twice daily.   Atrial fibrillation/hypercoagulable state-currently on Xarelto  20 mg daily, hemoglobin with recent drop 8.4 > 8.5 > 8.5  > 8.4 and currently appears to be stable, with his CHA2DS2-VASc score of 7, we will continue Xarelto  for now and  continue to trend his CBCs.  Currently on Protonix  40 mg daily, following with GI with upcoming capsule endoscopy.  Continue Coreg  12.5 mg twice daily.  Pedal edema - will continue lasix  20 mg daily for now, no appreciable pedal edema.   Anemia -denies frank bleeding, following with GI with upcoming capsule endoscopy, currently on Protonix  40 mg daily.  HTN -blood pressures elevated at 162/81, concerned with fluctuation of blood pressure readings throughout the day.  Will have him keep a blood pressure log for 2 weeks.  For now, continue lisinopril  10 mg daily, continue Coreg  12.5 mg twice daily.  DOE - likely multifactorial related to his obesity and anemia but his wife is  concerned, pt feels he is breathing fine. Will repeat echo for any contributory causes.        Dispo: CBC, echo, keep follow-up in April.  Signed, Delon JAYSON Hoover, NP  "

## 2023-10-31 ENCOUNTER — Ambulatory Visit: Payer: PPO | Attending: Cardiology | Admitting: Cardiology

## 2023-10-31 ENCOUNTER — Encounter: Payer: Self-pay | Admitting: Cardiology

## 2023-10-31 VITALS — BP 161/88 | HR 71 | Ht 76.0 in | Wt 315.6 lb

## 2023-10-31 DIAGNOSIS — I10 Essential (primary) hypertension: Secondary | ICD-10-CM | POA: Diagnosis not present

## 2023-10-31 DIAGNOSIS — D6859 Other primary thrombophilia: Secondary | ICD-10-CM

## 2023-10-31 DIAGNOSIS — I2510 Atherosclerotic heart disease of native coronary artery without angina pectoris: Secondary | ICD-10-CM

## 2023-10-31 DIAGNOSIS — D649 Anemia, unspecified: Secondary | ICD-10-CM

## 2023-10-31 DIAGNOSIS — I4821 Permanent atrial fibrillation: Secondary | ICD-10-CM | POA: Diagnosis not present

## 2023-10-31 DIAGNOSIS — R0609 Other forms of dyspnea: Secondary | ICD-10-CM | POA: Diagnosis not present

## 2023-10-31 DIAGNOSIS — Z951 Presence of aortocoronary bypass graft: Secondary | ICD-10-CM | POA: Diagnosis not present

## 2023-10-31 NOTE — Patient Instructions (Signed)
Medication Instructions:  Your physician recommends that you continue on your current medications as directed. Please refer to the Current Medication list given to you today.  *If you need a refill on your cardiac medications before your next appointment, please call your pharmacy*   Lab Work: Your physician recommends that you have labs done in the office today. Your test included  basic metabolic panel, complete blood count.  If you have labs (blood work) drawn today and your tests are completely normal, you will receive your results only by: MyChart Message (if you have MyChart) OR A paper copy in the mail If you have any lab test that is abnormal or we need to change your treatment, we will call you to review the results.   Testing/Procedures: Echocardiogram An echocardiogram is a test that uses sound waves (ultrasound) to produce images of the heart. Images from an echocardiogram can provide important information about: Heart size and shape. The size and thickness and movement of your heart's walls. Heart muscle function and strength. Heart valve function or if you have stenosis. Stenosis is when the heart valves are too narrow. If blood is flowing backward through the heart valves (regurgitation). A tumor or infectious growth around the heart valves. Areas of heart muscle that are not working well because of poor blood flow or injury from a heart attack. Aneurysm detection. An aneurysm is a weak or damaged part of an artery wall. The wall bulges out from the normal force of blood pumping through the body. Tell a health care provider about: Any allergies you have. All medicines you are taking, including vitamins, herbs, eye drops, creams, and over-the-counter medicines. Any blood disorders you have. Any surgeries you have had. Any medical conditions you have. Whether you are pregnant or may be pregnant. What are the risks? Generally, this is a safe test. However, problems may  occur, including an allergic reaction to dye (contrast) that may be used during the test. What happens before the test? No specific preparation is needed. You may eat and drink normally. What happens during the test?  You will take off your clothes from the waist up and put on a hospital gown. Electrodes or electrocardiogram (ECG)patches may be placed on your chest. The electrodes or patches are then connected to a device that monitors your heart rate and rhythm. You will lie down on a table for an ultrasound exam. A gel will be applied to your chest to help sound waves pass through your skin. A handheld device, called a transducer, will be pressed against your chest and moved over your heart. The transducer produces sound waves that travel to your heart and bounce back (or "echo" back) to the transducer. These sound waves will be captured in real-time and changed into images of your heart that can be viewed on a video monitor. The images will be recorded on a computer and reviewed by your health care provider. You may be asked to change positions or hold your breath for a short time. This makes it easier to get different views or better views of your heart. In some cases, you may receive contrast through an IV in one of your veins. This can improve the quality of the pictures from your heart. The procedure may vary among health care providers and hospitals. What can I expect after the test? You may return to your normal, everyday life, including diet, activities, and medicines, unless your health care provider tells you not to do that. Follow  these instructions at home: It is up to you to get the results of your test. Ask your health care provider, or the department that is doing the test, when your results will be ready. Keep all follow-up visits. This is important. Summary An echocardiogram is a test that uses sound waves (ultrasound) to produce images of the heart. Images from an  echocardiogram can provide important information about the size and shape of your heart, heart muscle function, heart valve function, and other possible heart problems. You do not need to do anything to prepare before this test. You may eat and drink normally. After the echocardiogram is completed, you may return to your normal, everyday life, unless your health care provider tells you not to do that. This information is not intended to replace advice given to you by your health care provider. Make sure you discuss any questions you have with your health care provider. Document Revised: 05/31/2021 Document Reviewed: 05/10/2020 Elsevier Patient Education  2023 Elsevier Inc.        Follow-Up: At [facility], you and your health needs are our priority.  As part of our continuing mission to provide you with exceptional heart care, we have created designated Provider Care Teams.  These Care Teams include your primary Cardiologist (physician) and Advanced Practice Providers (APPs -  Physician Assistants and Nurse Practitioners) who all work together to provide you with the care you need, when you need it.  We recommend signing up for the patient portal called "MyChart".  Sign up information is provided on this After Visit Summary.  MyChart is used to connect with patients for Virtual Visits (Telemedicine).  Patients are able to view lab/test results, encounter notes, upcoming appointments, etc.  Non-urgent messages can be sent to your provider as well.   To learn more about what you can do with MyChart, go to [email or web address].    Your next appointment:   Follow up in April

## 2023-11-01 LAB — CBC
Hematocrit: 30 % — ABNORMAL LOW (ref 37.5–51.0)
Hemoglobin: 9 g/dL — ABNORMAL LOW (ref 13.0–17.7)
MCH: 29.5 pg (ref 26.6–33.0)
MCHC: 30 g/dL — ABNORMAL LOW (ref 31.5–35.7)
MCV: 98 fL — ABNORMAL HIGH (ref 79–97)
Platelets: 172 x10E3/uL (ref 150–450)
RBC: 3.05 x10E6/uL — ABNORMAL LOW (ref 4.14–5.80)
RDW: 15.6 % — ABNORMAL HIGH (ref 11.6–15.4)
WBC: 5.1 x10E3/uL (ref 3.4–10.8)

## 2023-11-01 NOTE — Progress Notes (Signed)
 Agree with assessment/plan.  Edman Circle, MD [facility] [phone]

## 2023-11-06 ENCOUNTER — Other Ambulatory Visit: Payer: PPO

## 2023-11-06 ENCOUNTER — Other Ambulatory Visit: Payer: Self-pay

## 2023-11-06 DIAGNOSIS — R195 Other fecal abnormalities: Secondary | ICD-10-CM | POA: Diagnosis not present

## 2023-11-06 DIAGNOSIS — D649 Anemia, unspecified: Secondary | ICD-10-CM

## 2023-11-06 LAB — CBC WITH DIFFERENTIAL/PLATELET
Basophils Absolute: 0.1 10*3/uL (ref 0.0–0.1)
Basophils Relative: 1.2 % (ref 0.0–3.0)
Eosinophils Absolute: 0.3 10*3/uL (ref 0.0–0.7)
Eosinophils Relative: 4.7 % (ref 0.0–5.0)
HCT: 29.4 % — ABNORMAL LOW (ref 39.0–52.0)
Hemoglobin: 9.3 g/dL — ABNORMAL LOW (ref 13.0–17.0)
Lymphocytes Relative: 23.7 % (ref 12.0–46.0)
Lymphs Abs: 1.3 10*3/uL (ref 0.7–4.0)
MCHC: 31.8 g/dL (ref 30.0–36.0)
MCV: 95 fL (ref 78.0–100.0)
Monocytes Absolute: 0.5 10*3/uL (ref 0.1–1.0)
Monocytes Relative: 9 % (ref 3.0–12.0)
Neutro Abs: 3.3 10*3/uL (ref 1.4–7.7)
Neutrophils Relative %: 61.4 % (ref 43.0–77.0)
Platelets: 159 10*3/uL (ref 150.0–400.0)
RBC: 3.09 Mil/uL — ABNORMAL LOW (ref 4.22–5.81)
RDW: 18.7 % — ABNORMAL HIGH (ref 11.5–15.5)
WBC: 5.3 10*3/uL (ref 4.0–10.5)

## 2023-11-07 NOTE — Addendum Note (Signed)
 Addended by: Heber Little A on: 11/07/2023 02:07 PM   Modules accepted: Orders

## 2023-11-11 ENCOUNTER — Encounter: Payer: Self-pay | Admitting: Psychology

## 2023-11-11 DIAGNOSIS — I679 Cerebrovascular disease, unspecified: Secondary | ICD-10-CM | POA: Insufficient documentation

## 2023-11-11 DIAGNOSIS — I6381 Other cerebral infarction due to occlusion or stenosis of small artery: Secondary | ICD-10-CM | POA: Insufficient documentation

## 2023-11-12 ENCOUNTER — Ambulatory Visit: Payer: Self-pay | Admitting: Psychology

## 2023-11-12 ENCOUNTER — Ambulatory Visit: Payer: PPO

## 2023-11-12 ENCOUNTER — Encounter: Payer: Self-pay | Admitting: Psychology

## 2023-11-12 DIAGNOSIS — I63532 Cerebral infarction due to unspecified occlusion or stenosis of left posterior cerebral artery: Secondary | ICD-10-CM | POA: Diagnosis not present

## 2023-11-12 DIAGNOSIS — F0670 Mild neurocognitive disorder due to known physiological condition without behavioral disturbance: Secondary | ICD-10-CM

## 2023-11-12 DIAGNOSIS — I679 Cerebrovascular disease, unspecified: Secondary | ICD-10-CM

## 2023-11-12 DIAGNOSIS — R4189 Other symptoms and signs involving cognitive functions and awareness: Secondary | ICD-10-CM

## 2023-11-12 HISTORY — DX: Mild neurocognitive disorder due to known physiological condition without behavioral disturbance: F06.70

## 2023-11-12 NOTE — Progress Notes (Addendum)
NEUROPSYCHOLOGICAL EVALUATION [place]. [facility] [place] Department of Neurology  Date of Evaluation: November 12, 2023  Reason for Referral:   Ryan Vaughan is a 78 y.o. right-handed Caucasian male referred by Marlowe Kays, PA-C, to characterize his current cognitive functioning and assist with diagnostic clarity and treatment planning in the context of a prior stroke history and concern for progressive cognitive decline.   Assessment and Plan:   Clinical Impression(s): Ryan Vaughan pattern of performance is suggestive of severe impairment surrounding all aspects of learning and memory. Additional impairments were exhibited across complex attention, executive functioning, and verbal fluency (phonemic worse than semantic), while performance was variable across processing speed and visuospatial abilities. Performances were appropriate relative to age-matched peers across basic attention, receptive language, and confrontation naming. Ryan Vaughan denied difficulties completing instrumental activities of daily living (ADLs) independently. His wife was generally in agreement. As such, given evidence for cognitive dysfunction described above, he meets criteria for a Mild Neurocognitive Disorder ("mild cognitive impairment") at the present time.  The cause for ongoing memory impairment is unclear at the present time. Prior neuroimaging has suggested moderately advanced microvascular ischemic disease, a tiny left posterior frontal vertex stroke, and another more recent stroke affecting left PCA circulation. Specific to the latter, depending on the size and extension into temporal lobe structures, left PCA strokes can create some verbal memory dysfunction assuming normal brain lateralization in this right-handed individual. The combination of these factors creating ongoing dysfunction is at least a plausible conceptualization.   Despite this, I do have concern surrounding an underlying  neurodegenerative illness, namely Alzheimer's disease. Across verbal and visual memory testing, Ryan Vaughan was basically amnestic (i.e., 0% retention across list and figure tasks, 17% across a story task) after a brief delay and performed very poorly across yes/no recognition trials. This suggests evidence for rapid forgetting and a prominent storage impairment, both of which represent the hallmark testing characteristics of this illness. Intact confrontation naming is encouraging and could suggest this illness being in early stages if it is indeed present. While his left PCA infarct could account for some verbal memory impairment, it would not explain complete amnesia surrounding visual memory. I also question the overall size and severity of this stroke given that I did not see any evidence for a right homonymous hemianopsia across testing which would be an expected phenomenon given this stroke location. Overall, a mixed etiology may fit the testing data best and ongoing medical monitoring will be very important moving forward to better determine the risk for underlying Alzheimer's disease.   Recommendations: A repeat neuropsychological evaluation in 12-24 months (sooner if functional decline is noted) is recommended to assess the trajectory of future cognitive decline should it occur. This will also aid in future efforts towards improved diagnostic clarity.  Ryan Vaughan has already been prescribed medication aimed to address memory loss and concerns surrounding Alzheimer's disease (i.e., donepezil/Aricept and memantine/Namenda). He is encouraged to continue taking these medications as prescribed. It is important to highlight that these medications have been shown to slow functional decline in some individuals. There is no current treatment which can stop or reverse cognitive decline when caused by a neurodegenerative illness.   Performance across neurocognitive testing is not a strong predictor of an  individual's safety operating a motor vehicle. Should his family wish to pursue a formalized driving evaluation, they could reach out to the following agencies: [employer] in [place]: [phone] [facility]: [phone] [facility]:  [phone] [facility]: [phone] or [phone]  Should there be progression of current deficits over time, Ryan Vaughan is unlikely to regain any independent living skills lost. Therefore, it is recommended that he remain as involved as possible in all aspects of household chores, finances, and medication management, with supervision to ensure adequate performance. He will likely benefit from the establishment and maintenance of a routine in order to maximize his functional abilities over time.  It will be important for Ryan Vaughan to have another person with him when in situations where he may need to process information, weigh the pros and cons of different options, and make decisions, in order to ensure that he fully understands and recalls all information to be considered.  If not already done, Ryan Vaughan. Gift and his family may want to discuss his wishes regarding durable power of attorney and medical decision making, so that he can have input into these choices. If they require legal assistance with this, long-term care resource access, or other aspects of estate planning, they could reach out to [employer] at [phone] for a free consultation. Additionally, they may wish to discuss future plans for caretaking and seek out community options for in home/residential care should they become necessary.  Ryan Vaughan. Fla[place] is encouraged to attend to lifestyle factors for brain health (e.g., regular physical exercise, good nutrition habits and consideration of the MIND-DASH diet, regular participation in cognitively-stimulating activities, and general stress management techniques), which are likely to have benefits  for both emotional adjustment and cognition. Optimal control of vascular risk factors (including safe cardiovascular exercise and adherence to dietary recommendations) is encouraged. Continued participation in activities which provide mental stimulation and social interaction is also recommended.   Important information should be provided to Ryan Vaughan in written format in all instances. This information should be placed in a highly frequented and easily visible location within his home to promote recall. External strategies such as written notes in a consistently used memory journal, visual and nonverbal auditory cues such as a calendar on the refrigerator or appointments with alarm, such as on a cell phone, can also help maximize recall.  To address problems with processing speed, he may wish to consider:   -Ensuring that he is alerted when essential material or instructions are being presented   -Adjusting the speed at which new information is presented   -Allowing for more time in comprehending, processing, and responding in conversation   -Repeating and paraphrasing instructions or conversations aloud  To address problems with fluctuating attention and/or executive dysfunction, he may wish to consider:   -Avoiding external distractions when needing to concentrate   -Limiting exposure to fast paced environments with multiple sensory demands   -Writing down complicated information and using checklists   -Attempting and completing one task at a time (i.e., no multi-tasking)   -Verbalizing aloud each step of a task to maintain focus   -Taking frequent breaks during the completion of steps/tasks to avoid fatigue   -Reducing the amount of information considered at one time   -Scheduling more difficult activities for a time of day where he is usually most alert  Review of Records:   Ryan Vaughan was seen by [facility] Marlowe Kays, PA-C) on 02/15/2023 for an evaluation of memory loss. At  that time, difficulties were said to be present for the past year or so but had seemed worse during the prior six months. Examples largely surrounding trouble recalling newly provided information. His wife noted increased repetition  in day-to-day conversation. His daughter expressed some concern for situational depression due to a diminished desire to be active and some feelings of worthlessness. His wife is involved in medication management as Ryan Vaughan had not been taking his Xarelto regularly in the past. No other functional concerns were noted. Performance on a brief cognitive screening instrument (MOCA) was 16/30. Ultimately, Ryan Vaughan was referred for a comprehensive neuropsychological evaluation to characterize his cognitive abilities and to assist with diagnostic clarity and treatment planning.   Neuroimaging: Brain MRI on 08/12/2022 (no images available) suggested moderate microvascular ischemic disease with tiny embolic strokes in the left posterior frontal vertex and left occipital lobe. Brain MRI on 02/27/2023 suggested progressed ischemia in the left PCA territory since his November 2023 scan (patchy and confluent T2 and FLAIR hyperintensity along the medial left occipital lobe and tracking into the posterior mesial temporal lobe on the left), with a moderate sized late subacute to early chronic cortical infarct with associated hemosiderin. There was also background moderately advanced microvascular ischemic disease.   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   Coronary artery disease    Diabetic neuropathy 12/03/2022   Dyslipidemia 05/04/2015   Emphysema of lung     Essential hypertension 04/08/2014   Gout 01/24/2018   Hammertoes of both feet 11/14/2022   Hearing loss    bilateral - no hearing aids   History of carotid endarterectomy 05/14/2017   Lacunar infarction    b/l dorsal thalami    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 Ascending colon polyp status post polypectomy.   CORONARY ARTERY BYPASS GRAFT N/A 09/10/2019   Procedure: CORONARY ARTERY BYPASS GRAFTING (CABG) x4 , using left internal mammary artery, and right leg greater saphenous vein harvested endoscopically;  Surgeon: Alleen Borne, MD;  Location: [facility] OR;  Service: Open Heart Surgery;  Laterality: N/A;   IR ANGIO INTRA EXTRACRAN SEL COM CAROTID INNOMINATE BILAT MOD SED  05/17/2023   IR ANGIO VERTEBRAL SEL VERTEBRAL UNI R MOD SED  05/17/2023   IR RADIOLOGIST EVAL & MGMT  05/13/2023   IR US GUIDE VASC ACCESS RIGHT  05/17/2023   LEFT HEART CATH AND  CORONARY ANGIOGRAPHY N/A 08/28/2019   Procedure: LEFT HEART CATH AND CORONARY  ANGIOGRAPHY;  Surgeon: Lyn Records, MD;  Location: [facility] INVASIVE CV LAB;  Service: Cardiovascular;  Laterality: N/A;   SIGMOIDOSCOPY  11/17/2012   Minimal residual polyp, status post polypectomy. Small internal hemorrhoids.   TEE WITHOUT CARDIOVERSION N/A 09/10/2019   Procedure: TRANSESOPHAGEAL ECHOCARDIOGRAM (TEE);  Surgeon: Alleen Borne, MD;  Location[facility] OR;  Service: Open Heart Surgery;  Laterality: N/A;    Current Outpatient Medications:    acetaminophen (TYLENOL) 500 MG tablet, Take 500-1,500 mg by mouth every 8 (eight) hours as needed for moderate pain or headache., Disp: , Rfl:    aspirin EC 81 MG tablet, Take 81 mg by mouth daily., Disp: , Rfl:    atorvastatin (LIPITOR) 80 MG tablet, TAKE 1 TABLET BY MOUTH ONCE DAILY, Disp: 90 tablet, Rfl: 3   carvedilol (COREG) 12.5 MG tablet, Take 1 tablet (12.5 mg total) by mouth 2 (two) times daily., Disp: 180 tablet, Rfl: 1   cyanocobalamin (VITAMIN B12) 1000 MCG tablet, Take 1,000 mcg by mouth daily., Disp: , Rfl:    donepezil (ARICEPT) 10 MG tablet, Take 10 mg by mouth at bedtime., Disp: , Rfl:    FLUoxetine (PROZAC) 20 MG tablet, Take 20 mg by mouth daily., Disp: , Rfl:    furosemide (LASIX) 20 MG tablet, Take 1 tablet (20 mg total) by mouth daily., Disp: 90 tablet, Rfl: 3   gabapentin (NEURONTIN) 800 MG tablet, Take 800 mg by mouth as needed for pain., Disp: , Rfl:    glimepiride (AMARYL) 4 MG tablet, Take 4 mg by mouth 2 (two) times daily., Disp: , Rfl:    LANTUS SOLOSTAR 100 UNIT/ML Solostar Pen, Inject 25 Units into the skin at bedtime., Disp: , Rfl:    lisinopril (ZESTRIL) 10 MG tablet, Take 1 tablet (10 mg total) by mouth daily., Disp: 90 tablet, Rfl: 3   memantine (NAMENDA) 5 MG tablet, Take 5 mg by mouth 2 (two) times daily., Disp: , Rfl:    nitroGLYCERIN (NITROSTAT) 0.4 MG SL tablet, Place 1 tablet (0.4 mg total) under the tongue every 5 (five) minutes as needed for chest pain. (Patient not taking: Reported on 10/07/2023), Disp: 30  tablet, Rfl: 4   pantoprazole (PROTONIX) 40 MG tablet, Take 1 tablet (40 mg total) by mouth daily., Disp: 30 tablet, Rfl: 11   rivaroxaban (XARELTO) 20 MG TABS tablet, Take 1 tablet (20 mg total) by mouth daily with supper., Disp: 30 tablet, Rfl: 12   Vitamin D, Ergocalciferol, (DRISDOL) 1.25 MG (50000 UT) CAPS capsule, Take 50,000 Units by mouth every Friday. , Disp: , Rfl:   Clinical Interview:   The following information was obtained during a clinical interview with Ryan Vaughan and his wife prior to cognitive testing.  Cognitive Symptoms: Decreased short-term memory: Denied. His wife reported greater short-term memory concerns, largely surrounding Ryan Vaughan having trouble recalling details of recent conversations and being more repetitive in day-to-day conversation. Difficulties were said to "come and go" rather than be consistent or progressive. She reported noticeable concerns for the past 5-6 months or so. Decreased long-term memory: Denied. Decreased attention/concentration: Denied. Reduced processing speed: Endorsed "maybe a little bit" depending on the situation.  Difficulties with executive functions: Denied. Difficulties with emotion regulation: Denied. He and his wife also denied any notable personality changes.  Difficulties with receptive language: Denied. Difficulties with word finding: Denied. Decreased visuoperceptual ability: Denied.  Difficulties completing ADLs: Somewhat. His wife is involved in  medication management. There had been concerns that Ryan Vaughan was not taking his prescribed Xarelto in the past and this may have led to some cerebrovascular events. Since this was discovered, she has taken a more active role in medication management. He denied trouble with financial management or bill paying. He also denied any driving concerns. His wife was in agreement.   Additional Medical History: History of traumatic brain injury/concussion: Denied. History of stroke:  Endorsed (see above). History of seizure activity: Denied. History of known exposure to toxins: Denied. Symptoms of chronic pain: Denied outside of some arthritic pain localized to his knees.  Experience of frequent headaches/migraines: Denied. Frequent instances of dizziness/vertigo: Denied.  Sensory changes: He wears glasses with benefit. He did not report any known right sided field cut or trouble with peripheral vision given his past left PCA territory infarct. Other sensory changes/difficulties (e.g., hearing, taste, smell) were denied.  Balance/coordination difficulties: Denied. He also denied any recent falls.  Other motor difficulties: Denied.  Sleep History: Estimated hours obtained each night: 8 hours.  Difficulties falling asleep: Denied. Difficulties staying asleep: Denied. Feels rested and refreshed upon awakening: Endorsed.  History of snoring: Endorsed. History of waking up gasping for air: Denied. Witnessed breath cessation while asleep: Denied.  History of vivid dreaming: Denied. Excessive movement while asleep: Denied. Instances of acting out his dreams: Denied.  Psychiatric/Behavioral Health History: Depression: He described his current mood as being normal and denied to his knowledge any prior mental health concerns or formal diagnoses. His wife noted that Ms. Wertman had started him on an anti-depressant medication following his initial visit in May 2024. This was likely due to concerns surrounding an adjustment disorder with some depressed mood. He denied current or remote suicidal ideation, intent, or plan.  Anxiety: Denied. Mania: Denied. Trauma History: Denied. Visual/auditory hallucinations: Denied. Delusional thoughts: Denied.  Tobacco: Denied. Alcohol: He denied current alcohol consumption as well as a history of problematic alcohol abuse or dependence.  Recreational drugs: Denied.  Family History: Problem Relation Age of Onset   Hypertension Mother     Colon cancer Neg Hx    Stomach cancer Neg Hx    Liver cancer Neg Hx    Throat cancer Neg Hx    Colon polyps Neg Hx    Esophageal cancer Neg Hx    Rectal cancer Neg Hx    This information was confirmed by Ryan Vaughan.  Academic/Vocational History: Highest level of educational attainment: 10 years. He left high school after completing the 10th grade to enter the workforce. He described himself as an average (B/C) student prior to this. No relative weaknesses were identified.  History of developmental delay: Denied. History of grade repetition: Denied. Enrollment in special education courses: Denied. History of LD/ADHD: Denied.  Employment: Retired. 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   Evaluation Results:   Behavioral Observations: Ryan Vaughan was accompanied by his wife, arrived to his appointment on time, and was appropriately dressed and groomed. He appeared alert. Observed gait and station were within normal limits. Gross motor functioning appeared intact upon informal observation and no abnormal movements (e.g., tremors) were noted. His affect was generally relaxed and positive. Spontaneous speech was fluent and word finding difficulties were not observed during the clinical interview. Thought processes were coherent, organized, and normal in content. Insight into his cognitive difficulties appeared poor and I do not believe he has adequate insight into the extent of ongoing memory impairment.  During testing, he was noted to be very repetitive and commonly required instructions to be repeated due to memory loss. There was not clear evidence to suggest a right homonymous hemianopsia across testing procedures. Sustained attention was appropriate. Task engagement was adequate and he persisted when challenged. Overall, Ryan Vaughan was cooperative with the clinical interview and subsequent testing procedures.   Adequacy of Effort: The  validity of neuropsychological testing is limited by the extent to which the individual being tested may be assumed to have exerted adequate effort during testing. Ryan Vaughan expressed his intention to perform to the best of his abilities and exhibited adequate task engagement and persistence. Scores across stand-alone and embedded performance validity measures were within expectation. As such, the results of the current evaluation are believed to be a valid representation of Ryan Vaughan. Bolin current cognitive functioning.  Test Results: Ryan Vaughan. Garrido was mildly disoriented at the time of the current evaluation. He incorrectly stated the current year ("2024") and was unable to state the current date or name of the current clinic.  Intellectual abilities based upon educational and vocational attainment were estimated to be in the below average to average range. Premorbid abilities were estimated to be within the below average range based upon a single-word reading test.   Processing speed was variable, ranging from the exceptionally low to average normative ranges. Basic attention was average. More complex attention (e.g., working memory) was well below average. Executive functioning was largely exceptionally low. He did perform in the average range across a nonverbal reasoning task.  Assessed receptive language abilities were above average. Ryan Vaughan. Deziel did not exhibit prominent difficulties comprehending task instructions and answered all questions asked of him appropriately. Assessed expressive language was variable. Phonemic fluency was exceptionally low, semantic fluency was well below average to below average, and confrontation naming was average.     Assessed visuospatial/visuoconstructional abilities were variable, ranging from the well below average to average normative ranges.    Learning (i.e., encoding) of novel verbal information was exceptionally low to well below average. Spontaneous delayed  recall (i.e., retrieval) of previously learned information was exceptionally low. Retention rates were 0% across a list learning task, 17% (raw score of 1) across a story learning task, and 0% across a figure drawing task. Performance across recognition tasks was exceptionally low to well below average, suggesting negligible evidence for information consolidation.   Results of emotional screening instruments suggested that recent symptoms of generalized anxiety were in the mild to moderate range, while symptoms of depression were within the mild range. A screening instrument assessing recent sleep quality suggested the presence of minimal sleep dysfunction.  Tables of Scores:   Note: This summary of test scores accompanies the interpretive report and should not be considered in isolation without reference to the appropriate sections in the text. Descriptors are based on appropriate normative data and may be adjusted based on clinical judgment. Terms such as "Within Normal Limits" and "Outside Normal Limits" are used when a more specific description of the test score cannot be determined.       Percentile - Normative Descriptor > 98 - Exceptionally High 91-97 - Well Above Average 75-90 - Above Average 25-74 - Average 9-24 - Below Average 2-8 - Well Below Average < 2 - Exceptionally Low       Validity:   DESCRIPTOR       DCT: --- --- Within Normal Limits  RBANS EI: --- --- Within Normal Limits       Orientation:  Raw Score Percentile   NAB Orientation, Form 1 25/29 --- ---       Cognitive Screening:      Raw Score Percentile   SLUMS: 19/30 --- ---       RBANS, Form A: Standard Score/ Scaled Score Percentile   Total Score 62 1 Exceptionally Low  Immediate Memory 61 <1 Exceptionally Low    List Learning 4 2 Well Below Average    Story Memory 3 1 Exceptionally Low  Visuospatial/Constructional 72 3 Well Below Average    Figure Copy 5 5 Well Below Average    Line Orientation 12/20  3-9 Well Below Average  Language 88 21 Below Average    Picture Naming 10/10 51-75 Average    Semantic Fluency 5 5 Well Below Average  Attention 79 8 Well Below Average    Digit Span 10 50 Average    Coding 3 1 Exceptionally Low  Delayed Memory 48 <1 Exceptionally Low    List Recall 0/10 <2 Exceptionally Low    List Recognition 14/20 <2 Exceptionally Low    Story Recall 2 <1 Exceptionally Low    Story Recognition 7/12 5-7 Well Below Average    Figure Recall 1 <1 Exceptionally Low    Figure Recognition 2/8 2-3 Well Below Average        Intellectual Functioning:      Standard Score Percentile   Test of Premorbid Functioning: 81 10 Below Average       Attention/Executive Function:     Trail Making Test (TMT): Raw Score (T Score) Percentile     Part A 66 secs.,  0 errors (37) 9 Below Average    Part B Discontinued --- Impaired         Scaled Score Percentile   WAIS-5 Coding: 3 1 Exceptionally Low  WAIS-5 Naming Speed Quantity: 9 37 Average        Scaled Score Percentile   WAIS-5 Digits Forwards: 9 37 Average  WAIS-5 Digit Sequencing: 4 2 Well Below Average        Scaled Score Percentile   WAIS-5 Similarities: 3 1 Exceptionally Low  WAIS-5 Figure Weights: 8 25 Average       D-KEFS Verbal Fluency Test: Raw Score (Scaled Score) Percentile     Letter Total Correct 10 (3) 1 Exceptionally Low    Category Total Correct 21 (5) 5 Well Below Average    Category Switching Total Correct 6 (3) 1 Exceptionally Low    Category Switching Accuracy 3 (2) <1 Exceptionally Low      Total Set Loss Errors 4 (8) 25 Average      Total Repetition Errors 6 (7) 16 Below Average       Language:     Verbal Fluency Test: Raw Score (T Score) Percentile     Phonemic Fluency (FAS) 10 (26) 1 Exceptionally Low    Animal Fluency 11 (37) 9 Below Average        NAB Language Module, Form 1: T Score Percentile     Auditory Comprehension 57 75 Above Average    Naming 29/31 (50) 50 Average        Visuospatial/Visuoconstruction:      Raw Score Percentile   Clock Drawing: 10/10 --- Within Normal Limits        Scaled Score Percentile   WAIS-5 Block Design: 9 37 Average       Mood and Personality:      Raw Score Percentile   Geriatric Depression Scale: 12 --- Mild  Geriatric Anxiety Scale: 20 --- Mild    Somatic 7 --- Mild    Cognitive 7 --- Moderate    Affective 6 --- Mild       Additional Questionnaires:      Raw Score Percentile   PROMIS Sleep Disturbance Questionnaire: 12 --- None to Slight   Informed Consent and Coding/Compliance:   The current evaluation represents a clinical evaluation for the purposes previously outlined by the referral source and is in no way reflective of a forensic evaluation.   Ryan Vaughan. Koren was provided with a verbal description of the nature and purpose of the present neuropsychological evaluation. Also reviewed were the foreseeable risks and/or discomforts and benefits of the procedure, limits of confidentiality, and mandatory reporting requirements of this provider. The patient was given the opportunity to ask questions and receive answers about the evaluation. Oral consent to participate was provided by the patient.   This evaluation was conducted by Newman Nickels, Ph.D., ABPP-CN, board certified clinical neuropsychologist. Ryan Vaughan. Samples completed a clinical interview with Dr. Milbert Coulter, billed as one unit [phone], and 125 minutes of cognitive testing and scoring, billed as one unit [phone] and three additional units [id number]. Psychometrist Shan Levans, B.S. assisted Dr. Milbert Coulter with test administration and scoring procedures. As a separate and discrete service, one unit [id number] and two units [phone] were billed for Dr. Tammy Sours time spent in interpretation and report writing.

## 2023-11-12 NOTE — Progress Notes (Signed)
   Psychometrician Note   Cognitive testing was administered to Ryan Vaughan by Shan Levans, B.S. (psychometrist) under the supervision of Dr. Newman Nickels, Ph.D., ABPP, licensed psychologist on 11/12/2023. Ryan Vaughan did not appear overtly distressed by the testing session per behavioral observation or responses across self-report questionnaires. Rest breaks were offered.    The battery of tests administered was selected by Dr. Newman Nickels, Ph.D., ABPP with consideration to Ryan Vaughan current level of functioning, the nature of his symptoms, emotional and behavioral responses during interview, level of literacy, observed level of motivation/effort, and the nature of the referral question. This battery was communicated to the psychometrist. Communication between Dr. Newman Nickels, Ph.D., ABPP and the psychometrist was ongoing throughout the evaluation and Dr. Newman Nickels, Ph.D., ABPP was immediately accessible at all times. Dr. Newman Nickels, Ph.D., ABPP provided supervision to the psychometrist on the date of this service to the extent necessary to assure the quality of all services provided.    Ryan Vaughan will return within approximately 1-2 weeks for an interactive feedback session with Dr. Milbert Coulter at which time his test performances, clinical impressions, and treatment recommendations will be reviewed in detail. Ryan Vaughan understands he can contact our office should he require our assistance before this time.  A total of 125 minutes of billable time were spent face-to-face with Ryan Vaughan by the psychometrist. This includes both test administration and scoring time. Billing for these services is reflected in the clinical report generated by Dr. Newman Nickels, Ph.D., ABPP  This note reflects time spent with the psychometrician and does not include test scores or any clinical interpretations made by Dr. Milbert Coulter. The full report will follow in a separate note.

## 2023-11-13 ENCOUNTER — Ambulatory Visit: Payer: PPO | Admitting: Gastroenterology

## 2023-11-13 ENCOUNTER — Other Ambulatory Visit (INDEPENDENT_AMBULATORY_CARE_PROVIDER_SITE_OTHER): Payer: PPO

## 2023-11-13 DIAGNOSIS — K31811 Angiodysplasia of stomach and duodenum with bleeding: Secondary | ICD-10-CM | POA: Diagnosis not present

## 2023-11-13 DIAGNOSIS — D649 Anemia, unspecified: Secondary | ICD-10-CM | POA: Diagnosis not present

## 2023-11-13 DIAGNOSIS — D500 Iron deficiency anemia secondary to blood loss (chronic): Secondary | ICD-10-CM | POA: Diagnosis not present

## 2023-11-13 DIAGNOSIS — R195 Other fecal abnormalities: Secondary | ICD-10-CM | POA: Diagnosis not present

## 2023-11-13 LAB — CBC WITH DIFFERENTIAL/PLATELET
Basophils Absolute: 0.1 10*3/uL (ref 0.0–0.1)
Basophils Relative: 1.2 % (ref 0.0–3.0)
Eosinophils Absolute: 0.2 10*3/uL (ref 0.0–0.7)
Eosinophils Relative: 4.2 % (ref 0.0–5.0)
HCT: 28.5 % — ABNORMAL LOW (ref 39.0–52.0)
Hemoglobin: 9.3 g/dL — ABNORMAL LOW (ref 13.0–17.0)
Lymphocytes Relative: 23.7 % (ref 12.0–46.0)
Lymphs Abs: 1.2 10*3/uL (ref 0.7–4.0)
MCHC: 32.5 g/dL (ref 30.0–36.0)
MCV: 92.9 fL (ref 78.0–100.0)
Monocytes Absolute: 0.5 10*3/uL (ref 0.1–1.0)
Monocytes Relative: 9.5 % (ref 3.0–12.0)
Neutro Abs: 3.2 10*3/uL (ref 1.4–7.7)
Neutrophils Relative %: 61.4 % (ref 43.0–77.0)
Platelets: 164 10*3/uL (ref 150.0–400.0)
RBC: 3.06 Mil/uL — ABNORMAL LOW (ref 4.22–5.81)
RDW: 17.4 % — ABNORMAL HIGH (ref 11.5–15.5)
WBC: 5.2 10*3/uL (ref 4.0–10.5)

## 2023-11-13 NOTE — Patient Instructions (Signed)
You may have a clear liquid diet at 10:30 am  You may have a light lunch beginning at 12:30 (ex 1/2 sandwich and a bowl of soup)  You may resume your normal diet at 5:00 pm  If you experience any abdominal pain, nausea ,or vomiting after ingesting the capsule please call [phone] to notify the nurse  You may not have an MRI until you have confirmation that you have passed the capsule.   Please return to the office today by 4 pm to return equipment.

## 2023-11-13 NOTE — Progress Notes (Signed)
 Capsule ID: [id number] Exp: 2024-12-14 LOT: [id number]  Patient arrived for Capsule Endoscopy. Reported the prep went well. This nurse explained dietary restrictions for the next few hours. Patient verbalized understanding. Opened capsule, ensured capsule was flashing prior to the patient swallowing the capsule. Patient swallowed capsule without difficulty. Patient instructed to return to the office at 4:00 pm today for removal of the recording equipment, to call the office with any questions and if no capsule was visualized after 72 hours. No further questions by the conclusion of the visit.   Patient was advised to stop by lab for CBC.

## 2023-11-19 ENCOUNTER — Ambulatory Visit: Payer: PPO | Admitting: Psychology

## 2023-11-19 DIAGNOSIS — I679 Cerebrovascular disease, unspecified: Secondary | ICD-10-CM

## 2023-11-19 DIAGNOSIS — F0670 Mild neurocognitive disorder due to known physiological condition without behavioral disturbance: Secondary | ICD-10-CM

## 2023-11-19 DIAGNOSIS — I63532 Cerebral infarction due to unspecified occlusion or stenosis of left posterior cerebral artery: Secondary | ICD-10-CM | POA: Diagnosis not present

## 2023-11-19 NOTE — Progress Notes (Signed)
   Neuropsychology Feedback Session Eligha Bridegroom. [facility] [place] Department of Neurology  Reason for Referral:   Ryan Vaughan is a 78 y.o. right-handed Caucasian male referred by Marlowe Kays, PA-C, to characterize his current cognitive functioning and assist with diagnostic clarity and treatment planning in the context of a prior stroke history and concern for progressive cognitive decline.   Feedback:   Mr. Huelsmann completed a comprehensive neuropsychological evaluation on 11/12/2023. Please refer to that encounter for the full report and recommendations. Briefly, results suggested severe impairment surrounding all aspects of learning and memory. Additional impairments were exhibited across complex attention, executive functioning, and verbal fluency (phonemic worse than semantic), while performance was variable across processing speed and visuospatial abilities. The cause for ongoing memory impairment is unclear at the present time. Prior neuroimaging has suggested moderately advanced microvascular ischemic disease, a tiny left posterior frontal vertex stroke, and another more recent stroke affecting left PCA circulation. Specific to the latter, depending on the size and extension into temporal lobe structures, left PCA strokes can create some verbal memory dysfunction assuming normal brain lateralization in this right-handed individual. The combination of these factors creating ongoing dysfunction is at least a plausible conceptualization. Despite this, I do have concern surrounding an underlying neurodegenerative illness, namely Alzheimer's disease. Across verbal and visual memory testing, Mr. Serviss was basically amnestic (i.e., 0% retention across list and figure tasks, 17% across a story task) after a brief delay and performed very poorly across yes/no recognition trials. This suggests evidence for rapid forgetting and a prominent storage impairment, both of which represent the  hallmark testing characteristics of this illness. Intact confrontation naming is encouraging and could suggest this illness being in early stages if it is indeed present. While his left PCA infarct could account for some verbal memory impairment, it would not explain complete amnesia surrounding visual memory. Overall, a mixed etiology may fit the testing data best and ongoing medical monitoring will be very important moving forward to better determine the risk for underlying Alzheimer's disease.  Mr. Noboa was accompanied by his wife during the current feedback session. Content of the current session focused on the results of his neuropsychological evaluation. Mr. Ragas was given the opportunity to ask questions and his questions were answered. He was encouraged to reach out should additional questions arise. A copy of his report was provided at the conclusion of the visit.      One unit [phone] was billed for Dr. Tammy Sours time spent preparing for, conducting, and documenting the current feedback session with Mr. Daft.

## 2023-11-23 ENCOUNTER — Other Ambulatory Visit: Payer: Self-pay | Admitting: Cardiology

## 2023-11-25 ENCOUNTER — Ambulatory Visit: Payer: PPO

## 2023-12-03 ENCOUNTER — Ambulatory Visit: Payer: PPO | Admitting: Physician Assistant

## 2023-12-03 ENCOUNTER — Encounter: Payer: Self-pay | Admitting: Physician Assistant

## 2023-12-03 VITALS — BP 123/63 | HR 71 | Resp 20 | Ht 76.0 in | Wt 315.0 lb

## 2023-12-03 DIAGNOSIS — Z8679 Personal history of other diseases of the circulatory system: Secondary | ICD-10-CM

## 2023-12-03 DIAGNOSIS — G3184 Mild cognitive impairment, so stated: Secondary | ICD-10-CM | POA: Diagnosis not present

## 2023-12-03 DIAGNOSIS — I6529 Occlusion and stenosis of unspecified carotid artery: Secondary | ICD-10-CM

## 2023-12-03 DIAGNOSIS — I679 Cerebrovascular disease, unspecified: Secondary | ICD-10-CM

## 2023-12-03 DIAGNOSIS — F0670 Mild neurocognitive disorder due to known physiological condition without behavioral disturbance: Secondary | ICD-10-CM

## 2023-12-03 DIAGNOSIS — I63532 Cerebral infarction due to unspecified occlusion or stenosis of left posterior cerebral artery: Secondary | ICD-10-CM

## 2023-12-03 HISTORY — DX: Personal history of other diseases of the circulatory system: Z86.79

## 2023-12-03 NOTE — Progress Notes (Signed)
 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 RH male with a history of hypertension, hyperlipidemia, anemia, atrial fibrillation on chronic Xarelto, history of CVA in 2015, CAD, RAS, vitamin D deficiency, DM2, bilateral carotid artery stenosis, RP1 and LP2 stenosis per arteriogram by IR,  GERD and a diagnosis of mild cognitive impairment per neuropsych evaluation presenting today in follow-up for evaluation of memory loss. Patient is on donepezil 10 mg daily and memantine 5 mg twice daily per PCP. Patient is able to participate on ADLs and to drive without difficulties . Memory is stable, mood better controlled with Prozac. 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         Recommendations:   Follow up in  6 months. Repeat neuropsych evaluation in 12 to 24 months for diagnostic clarity and disease trajectory Continue donepezil 10 mg daily and agree with increasing memantine 10  mg twice daily per PCP, side effects discussed Recommend good control of cardiovascular risk factors, continue ASA and Xarelto and ASA Repeat  MR angiogram to further evaluate the R P1 L P2 stenosis.  Recommend psychotherapy for situational depression Recommend increasing socialization and physical activities Continue to control mood as per PCP    Subjective:   This patient is accompanied in the office by his wife  who supplements the history. Previous records as well as any outside records available were reviewed prior to todays visit.  Patient was last seen on 06/05/2023 with [facility] 16/30.     Any changes in memory since last visit? "It's fairly good". Patient has some difficulty remembering recent conversations and names of people but they are "some good and some bad days" . LTM is " excellent"-wife agrees. .  repeats oneself?   Endorsed, "a lot". Disoriented when walking into a room?  Patient denies    Misplacing objects?  Patient denies   Wandering behavior?   denies   Any personality changes since last visit?   Denies, "but sometimes he looks irritated"- wife says. Recently "they changed the antidepressant"-wife says.  Any worsening depression?: denies   Hallucinations or paranoia?  denies   Seizures?   denies    Any sleep changes? Sleeps well Denies vivid dreams, REM behavior or sleepwalking   Sleep apnea?   Denies.    Any hygiene concerns? He needs to be reminded.  Independent of bathing and dressing?  Endorsed  Does the patient needs help with medications?  Wife is in charge   Who is in charge of the finances? Wife is in charge because he was forgetting to pay bills  "he was not going to the mailbox often". Any changes in appetite?  Denies.     Patient have trouble swallowing?  denies   Does the patient cook? No  Any kitchen accidents such as leaving the stove on?   denies   Any headaches?    Denies.   Vision changes? Denies. Chronic pain?  denies   Ambulates with difficulty?  Denies.   Recent falls or head injuries?  Denies.      Unilateral weakness, numbness or tingling?   denies   Any tremors?  Denies.   Any anosmia?    denies   Any incontinence of urine?  Denies.   Any bowel dysfunction?  denies      Patient lives with wife Does the  patient drive? Yes, denies any issues    Initial visit May 2024 How long did patient have memory difficulties? For about 1 year, worse over the last 6 months, especially wit STM.  Family noticed memory changes since Christmas of 2023.  He reports that he forgets new information.  He initially attributed  the symptoms to some depression but for the last 3 months, he has significant difficulty with numbers.  He denies any issues with LTM . repeats oneself?  Endorsed "every 5 minutes he asks the same question and tells the same story"-wife reports.  (He did not repeat  himself during this visit) Disoriented when walking into a room?  Patient denies except occasionally not remembering what patient came to the room for   Leaving objects in unusual places?   denies other than occasionally misplacing his glasses.  Wandering behavior? denies   Any personality changes ? denies   Any history of depression?:  Patient denies but daughter reports that he may be experiencing situational depression, with less desire of doing activities outside, having a feeling of worthlessness, decreased appetite.  His daughter states that "my parents do not believe in mental illness "but some of his friends have noticed that he is depressed. Hallucinations or paranoia?  denies   Seizures? denies    Any sleep changes?  Sleeps well "too much "- daughter says.  Denies vivid dreams, REM behavior or sleepwalking   Sleep apnea? denies   Any hygiene concerns?  denies   Independent of bathing and dressing?  Endorsed  Does the patient need help with medications? Wife is in charge for the last 2 months as he was not taking Xarelto.    Who is in charge of the finances? Patient  is in charge     Any changes in appetite? Decreased. Sees GI because he has early satiation, for endoscopy in 04/2023.   Patient have trouble swallowing?  denies   Does the patient cook? No  Any kitchen accidents such as leaving the stove on? Patient denies   Any headaches?  denies   Chronic back pain?  Knee and back chronic pain due to arthritis.  "I need surgery " Ambulates with difficulty? denies   Recent falls or head injuries? denies     Vision changes? Unilateral weakness, numbness or tingling?  denies   Any tremors?  denies   Any anosmia?  denies   Any incontinence of urine? denies   Any bowel dysfunction? denies      Patient lives with wife  History of heavy alcohol intake? denies   History of heavy tobacco use? denies   Family history of dementia?  Denies  Does patient drive? Yes. Daughter says sometimes  he has to think a little more about the directions, but she not worried about him getting lost.  Retired Education administrator.  MRI of the brain personally reviewed remarkable for progressed ischemia in the L PCA territory since a November MRI, with a moderate sized late subacute to early chronic cortical infarct .  Associated hemosiderin but no malignant hemorrhagic transformation or associated mass effect was noted.  Also seen, moderately advanced small vessel disease without changes from prior film.    Bilateral common carotid arteriograms, right vertebral arteriogram and left subclavian arteriogram by IR 05/17/2023 showing severe stenosis of the right posterior cerebral artery P1 segment and to a lesser degree of the left posterior cerebral artery, moderate stenosis of the right middle cerebral M1 segment, nonopacification of the left vertebral artery proximally.  IR and vascular surgery are recommending a repeat angiogram 6 months after this one, to further evaluate the R P1 L P2 stenosis    Neuropsych evaluation 11/2023 Briefly, results suggested severe impairment surrounding all aspects of learning and memory. Additional impairments were exhibited across complex attention, executive functioning, and verbal fluency (phonemic worse than semantic), while performance was variable across processing speed and visuospatial abilities. The cause for ongoing memory impairment is unclear at the present time. Prior neuroimaging has suggested moderately advanced microvascular ischemic disease, a tiny left posterior frontal vertex stroke, and another more recent stroke affecting left PCA circulation. Specific to the latter, depending on the size and extension into temporal lobe structures, left PCA strokes can create some verbal memory dysfunction assuming normal brain lateralization in this right-handed individual. The combination of these factors creating ongoing dysfunction is at least a plausible conceptualization. Despite  this, I do have concern surrounding an underlying neurodegenerative illness, namely Alzheimer's disease. Across verbal and visual memory testing, Mr. Cowin was basically amnestic (i.e., 0% retention across list and figure tasks, 17% across a story task) after a brief delay and performed very poorly across yes/no recognition trials. This suggests evidence for rapid forgetting and a prominent storage impairment, both of which represent the hallmark testing characteristics of this illness. Intact confrontation naming is encouraging and could suggest this illness being in early stages if it is indeed present. While his left PCA infarct could account for some verbal memory impairment, it would not explain complete amnesia surrounding visual memory. Overall, a mixed etiology may fit the testing data best and ongoing medical monitoring will be very important moving forward to better determine the risk for underlying Alzheimer's disease.    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   Coronary artery disease    Diabetic neuropathy 12/03/2022   Dyslipidemia 05/04/2015   Emphysema of lung    Essential hypertension 04/08/2014   Gout 01/24/2018   Hammertoes of both feet 11/14/2022   Hearing loss    bilateral - no hearing aids   History of carotid endarterectomy 05/14/2017   Lacunar infarction    b/l dorsal thalami    Left PCA stroke 03/2023   Major depressive disorder    Mild neurocognitive disorder due to multiple etiologies 11/12/2023   Osteoarthritis of left knee 01/24/2018   Overgrown toenails 11/14/2022   Plantar fasciitis of left foot 12/03/2022   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 Ascending colon polyp status post polypectomy.   CORONARY ARTERY BYPASS GRAFT N/A 09/10/2019   Procedure: CORONARY ARTERY BYPASS GRAFTING (CABG) x4 , using left internal mammary artery, and right leg greater saphenous vein harvested endoscopically;  Surgeon: Alleen Borne, MD;  Location: [facility] OR;  Service: Open Heart Surgery;  Laterality: N/A;   IR ANGIO INTRA EXTRACRAN SEL COM CAROTID INNOMINATE BILAT MOD SED  05/17/2023   IR ANGIO VERTEBRAL SEL VERTEBRAL UNI R MOD SED  05/17/2023   IR RADIOLOGIST EVAL & MGMT  05/13/2023   IR US GUIDE VASC ACCESS RIGHT  05/17/2023   LEFT HEART CATH AND CORONARY ANGIOGRAPHY N/A 08/28/2019   Procedure: LEFT HEART CATH AND CORONARY ANGIOGRAPHY;  Surgeon: Lyn Records, MD;  Location: [facility] INVASIVE CV LAB;  Service: Cardiovascular;  Laterality: N/A;   SIGMOIDOSCOPY  11/17/2012   Minimal residual polyp, status post polypectomy. Small internal hemorrhoids.   TEE WITHOUT CARDIOVERSION N/A 09/10/2019   Procedure: TRANSESOPHAGEAL ECHOCARDIOGRAM (TEE);  Surgeon: Alleen Borne, MD;  Location: [facility] OR;  Service: Open Heart Surgery;  Laterality: N/A;     PREVIOUS MEDICATIONS:    CURRENT MEDICATIONS:  Outpatient Encounter Medications as of 12/03/2023  Medication Sig   acetaminophen (TYLENOL) 500 MG tablet Take 500-1,500 mg by mouth every 8 (eight) hours as needed for moderate pain or headache.   aspirin EC 81 MG tablet Take 81 mg by mouth daily.   atorvastatin (LIPITOR) 80 MG tablet Take 1 tablet (80 mg total) by mouth daily.   carvedilol (COREG) 12.5 MG tablet Take 1 tablet (12.5 mg total) by mouth 2 (two) times daily.   cyanocobalamin (VITAMIN B12) 1000 MCG tablet Take 1,000 mcg by mouth daily.   donepezil (ARICEPT) 10 MG tablet Take 10 mg by mouth at bedtime.   escitalopram (LEXAPRO) 20 MG tablet Take 20 mg by mouth daily.   furosemide (LASIX) 20 MG tablet Take 1 tablet (20 mg total) by mouth daily.   gabapentin (NEURONTIN) 800 MG tablet Take 800 mg by mouth as needed for pain.   glimepiride (AMARYL) 4 MG tablet Take 4 mg by mouth 2 (two) times daily.   LANTUS SOLOSTAR 100 UNIT/ML Solostar Pen Inject 25 Units into the skin at bedtime.   lisinopril (ZESTRIL) 10 MG tablet Take 1 tablet (10 mg total) by mouth daily.   memantine (NAMENDA) 5 MG tablet Take 5 mg by mouth 2 (two) times daily.   nitroGLYCERIN (NITROSTAT) 0.4 MG SL tablet Place 1 tablet (0.4 mg total) under the tongue every 5 (five) minutes as needed for chest pain.   pantoprazole (PROTONIX) 40 MG tablet Take 1 tablet (40 mg total) by mouth daily.   rivaroxaban (XARELTO) 20 MG TABS tablet Take 1 tablet (20 mg total) by mouth daily with supper.   Vitamin D, Ergocalciferol, (DRISDOL) 1.25 MG (50000 UT) CAPS capsule Take 50,000 Units by mouth every Friday.    FLUoxetine (PROZAC) 20 MG tablet Take 20 mg by mouth daily. (Patient not taking: Reported on 12/03/2023)   No facility-administered encounter medications on file as of 12/03/2023.     Objective:     PHYSICAL EXAMINATION:    VITALS:   Vitals:   12/03/23 1112  BP: 123/63  Pulse: 71  Resp: 20  SpO2: 99%  Weight: (!) 315 lb (142.9 kg)  Height: 6'  4" (1.93 m)    GEN:  The patient appears  stated age and is in NAD. HEENT:  Normocephalic, atraumatic.   Neurological examination:  General: NAD, well-groomed, appears stated age. Orientation: The patient is alert. Oriented to person, place and not to date Cranial nerves: There is good facial symmetry.The speech is fluent and clear. No aphasia or dysarthria. Fund of knowledge is appropriate. Recent memory impaired and remote memory is normal.  Attention and concentration are normal.  Able to name objects and repeat phrases.  Hearing is somewhat decreased to conversational tone.  Sensation: Sensation is intact to light touch throughout Motor: Strength is at least antigravity x4. DTR's 1/4 in UE/LE      02/15/2023    9:00 AM  Montreal Cognitive Assessment   Visuospatial/ Executive (0/5) 3  Naming (0/3) 2  Attention: Read list of digits (0/2) 2  Attention: Read list of letters (0/1) 1  Attention: Serial 7 subtraction starting at 100 (0/3) 3  Language: Repeat phrase (0/2) 0  Language : Fluency (0/1) 0  Abstraction (0/2) 2  Delayed Recall (0/5) 0  Orientation (0/6) 2  Total 15  Adjusted Score (based on education) 16        No data to display             Movement examination: Tone: There is normal tone in the UE/LE Abnormal movements:  no tremor.  No myoclonus.  No asterixis.   Coordination:  There is no decremation with RAM's. Normal finger to nose  Gait and Station: The patient has no difficulty arising out of a deep-seated chair without the use of the hands. The patient's stride length is good.  Gait is cautious and narrow.   Thank you for allowing [place] the opportunity to participate in the care of this nice patient. Please do not hesitate to contact us for any questions or concerns.   Total time spent on today's visit was 30 minutes dedicated to this patient today, preparing to see patient, examining the patient, ordering tests and/or medications and counseling the patient,  documenting clinical information in the EHR or other health record, independently interpreting results and communicating results to the patient/family, discussing treatment and goals, answering patient's questions and coordinating care.  Cc:  Paulina Fusi, MD  Marlowe Kays 12/03/2023 11:51 AM

## 2023-12-03 NOTE — Patient Instructions (Signed)
 It was a pleasure to see you today at our office.   Recommendations:  MRA head and neck  Continue donepezil and memantine (Ok to increase memantine to 10 mg twice a day by primary) Follow up in 6 months  Sept 9 at 11;30    For guidance regarding [facility] and if placement were needed at the facility, contact Sidney Ace, Social Worker tel: [phone]  For assessment of decision of mental capacity and competency:  Call Dr. Erick Blinks, geriatric psychiatrist at [phone] Counseling regarding caregiver distress, including caregiver depression, anxiety and issues regarding community resources, adult day care programs, adult living facilities, or memory care questions:  please contact your  Primary Doctor's Social Worker  Whom to call: Memory  decline, memory medications: Call our office [phone]  For psychiatric meds, mood meds: Please have your primary care physician manage these medications.  If you have any severe symptoms of a stroke, or other severe issues such as confusion,severe chills or fever, etc call 911 or go to the ER as you may need to be evaluated further    RECOMMENDATIONS FOR ALL PATIENTS WITH MEMORY PROBLEMS: 1. Continue to exercise (Recommend 30 minutes of walking everyday, or 3 hours every week) 2. Increase social interactions - continue going to [place] and enjoy social gatherings with friends and family 3. Eat healthy, avoid fried foods and eat more fruits and vegetables 4. Maintain adequate blood pressure, blood sugar, and blood cholesterol level. Reducing the risk of stroke and cardiovascular disease also helps promoting better memory. 5. Avoid stressful situations. Live a simple life and avoid aggravations. Organize your time and prepare for the next day in anticipation. 6. Sleep well, avoid any interruptions of sleep and avoid any distractions in the bedroom that may interfere with adequate sleep quality 7. Avoid sugar, avoid sweets  as there is a strong link between excessive sugar intake, diabetes, and cognitive impairment We discussed the Mediterranean diet, which has been shown to help patients reduce the risk of progressive memory disorders and reduces cardiovascular risk. This includes eating fish, eat fruits and green leafy vegetables, nuts like almonds and hazelnuts, walnuts, and also use olive oil. Avoid fast foods and fried foods as much as possible. Avoid sweets and sugar as sugar use has been linked to worsening of memory function.  There is always a concern of gradual progression of memory problems. If this is the case, then we may need to adjust level of care according to patient needs. Support, both to the patient and caregiver, should then be put into place.      You have been referred for a neuropsychological evaluation (i.e., evaluation of memory and thinking abilities). Please bring someone with you to this appointment if possible, as it is helpful for the doctor to hear from both you and another adult who knows you well. Please bring eyeglasses and hearing aids if you wear them.    The evaluation will take approximately 3 hours and has two parts:   The first part is a clinical interview with the neuropsychologist (Dr. Milbert Coulter or Dr. Roseanne Reno). During the interview, the neuropsychologist will speak with you and the individual you brought to the appointment.    The second part of the evaluation is testing with the doctor's technician Annabelle Harman or Selena Batten). During the testing, the technician will ask you to remember different types of material, solve problems, and answer some questionnaires. Your family member will not be present for this portion of the evaluation.  Please note: We must reserve several hours of the neuropsychologist's time and the psychometrician's time for your evaluation appointment. As such, there is a No-Show fee of $100. If you are unable to attend any of your appointments, please contact our office as  soon as possible to reschedule.    FALL PRECAUTIONS: Be cautious when walking. Scan the area for obstacles that may increase the risk of trips and falls. When getting up in the mornings, sit up at the edge of the bed for a few minutes before getting out of bed. Consider elevating the bed at the head end to avoid drop of blood pressure when getting up. Walk always in a well-lit room (use night lights in the walls). Avoid area rugs or power cords from appliances in the middle of the walkways. Use a walker or a cane if necessary and consider physical therapy for balance exercise. Get your eyesight checked regularly.  FINANCIAL OVERSIGHT: Supervision, especially oversight when making financial decisions or transactions is also recommended.  HOME SAFETY: Consider the safety of the kitchen when operating appliances like stoves, microwave oven, and blender. Consider having supervision and share cooking responsibilities until no longer able to participate in those. Accidents with firearms and other hazards in the house should be identified and addressed as well.   ABILITY TO BE LEFT ALONE: If patient is unable to contact 911 operator, consider using LifeLine, or when the need is there, arrange for someone to stay with patients. Smoking is a fire hazard, consider supervision or cessation. Risk of wandering should be assessed by caregiver and if detected at any point, supervision and safe proof recommendations should be instituted.  MEDICATION SUPERVISION: Inability to self-administer medication needs to be constantly addressed. Implement a mechanism to ensure safe administration of the medications.   DRIVING: Regarding driving, in patients with progressive memory problems, driving will be impaired. We advise to have someone else do the driving if trouble finding directions or if minor accidents are reported. Independent driving assessment is available to determine safety of driving.   If you are interested  in the driving assessment, you can contact the following:  [employer] in [place] [phone]  [facility] [phone]  [facility] [phone] or [phone]    Mediterranean Diet A Mediterranean diet refers to food and lifestyle choices that are based on the traditions of countries located on the [employer] [place]. This way of eating has been shown to help prevent certain conditions and improve outcomes for people who have chronic diseases, like kidney disease and heart disease. What are tips for following this plan? Lifestyle  Cook and eat meals together with your family, when possible. Drink enough fluid to keep your urine clear or pale yellow. Be physically active every day. This includes: Aerobic exercise like running or swimming. Leisure activities like gardening, walking, or housework. Get 7-8 hours of sleep each night. If recommended by your health care provider, drink red wine in moderation. This means 1 glass a day for nonpregnant women and 2 glasses a day for men. A glass of wine equals 5 oz (150 mL). Reading food labels  Check the serving size of packaged foods. For foods such as rice and pasta, the serving size refers to the amount of cooked product, not dry. Check the total fat in packaged foods. Avoid foods that have saturated fat or trans fats. Check the ingredients list for added sugars, such as corn syrup. Shopping  At the grocery  store, buy most of your food from the areas near the walls of the store. This includes: Fresh fruits and vegetables (produce). Grains, beans, nuts, and seeds. Some of these may be available in unpackaged forms or large amounts (in bulk). Fresh seafood. Poultry and eggs. Low-fat dairy products. Buy whole ingredients instead of prepackaged foods. Buy fresh fruits and vegetables in-season from local farmers markets. Buy frozen fruits and vegetables in  resealable bags. If you do not have access to quality fresh seafood, buy precooked frozen shrimp or canned fish, such as tuna, salmon, or sardines. Buy small amounts of raw or cooked vegetables, salads, or olives from the deli or salad bar at your store. Stock your pantry so you always have certain foods on hand, such as olive oil, canned tuna, canned tomatoes, rice, pasta, and beans. Cooking  Cook foods with extra-virgin olive oil instead of using butter or other vegetable oils. Have meat as a side dish, and have vegetables or grains as your main dish. This means having meat in small portions or adding small amounts of meat to foods like pasta or stew. Use beans or vegetables instead of meat in common dishes like chili or lasagna. Experiment with different cooking methods. Try roasting or broiling vegetables instead of steaming or sauteing them. Add frozen vegetables to soups, stews, pasta, or rice. Add nuts or seeds for added healthy fat at each meal. You can add these to yogurt, salads, or vegetable dishes. Marinate fish or vegetables using olive oil, lemon juice, garlic, and fresh herbs. Meal planning  Plan to eat 1 vegetarian meal one day each week. Try to work up to 2 vegetarian meals, if possible. Eat seafood 2 or more times a week. Have healthy snacks readily available, such as: Vegetable sticks with hummus. Greek yogurt. Fruit and nut trail mix. Eat balanced meals throughout the week. This includes: Fruit: 2-3 servings a day Vegetables: 4-5 servings a day Low-fat dairy: 2 servings a day Fish, poultry, or lean meat: 1 serving a day Beans and legumes: 2 or more servings a week Nuts and seeds: 1-2 servings a day Whole grains: 6-8 servings a day Extra-virgin olive oil: 3-4 servings a day Limit red meat and sweets to only a few servings a month What are my food choices? Mediterranean diet Recommended Grains: Whole-grain pasta. Brown rice. Bulgar wheat. Polenta. Couscous.  Whole-wheat bread. Orpah Cobb. Vegetables: Artichokes. Beets. Broccoli. Cabbage. Carrots. Eggplant. Green beans. Chard. Kale. Spinach. Onions. Leeks. Peas. Squash. Tomatoes. Peppers. Radishes. Fruits: Apples. Apricots. Avocado. Berries. Bananas. Cherries. Dates. Figs. Grapes. Lemons. Melon. Oranges. Peaches. Plums. Pomegranate. Meats and other protein foods: Beans. Almonds. Sunflower seeds. Pine nuts. Peanuts. Cod. Salmon. Scallops. Shrimp. Tuna. Tilapia. Clams. Oysters. Eggs. Dairy: Low-fat milk. Cheese. Greek yogurt. Beverages: Water. Red wine. Herbal tea. Fats and oils: Extra virgin olive oil. Avocado oil. Grape seed oil. Sweets and desserts: [place] yogurt with honey. Baked apples. Poached pears. Trail mix. Seasoning and other foods: Basil. Cilantro. Coriander. Cumin. Mint. Parsley. Sage. Rosemary. Tarragon. Garlic. Oregano. Thyme. Pepper. Balsalmic vinegar. Tahini. Hummus. Tomato sauce. Olives. Mushrooms. Limit these Grains: Prepackaged pasta or rice dishes. Prepackaged cereal with added sugar. Vegetables: Deep fried potatoes (french fries). Fruits: Fruit canned in syrup. Meats and other protein foods: Beef. Pork. Lamb. Poultry with skin. Hot dogs. Tomasa Blase. Dairy: Ice cream. Sour cream. Whole milk. Beverages: Juice. Sugar-sweetened soft drinks. Beer. Liquor and spirits. Fats and oils: Butter. Canola oil. Vegetable oil. Beef fat (tallow). Lard. Sweets and desserts: Cookies. Cakes. Pies. Candy.  Seasoning and other foods: Mayonnaise. Premade sauces and marinades. The items listed may not be a complete list. Talk with your dietitian about what dietary choices are right for you. Summary The Mediterranean diet includes both food and lifestyle choices. Eat a variety of fresh fruits and vegetables, beans, nuts, seeds, and whole grains. Limit the amount of red meat and sweets that you eat. Talk with your health care provider about whether it is safe for you to drink red wine in moderation. This  means 1 glass a day for nonpregnant women and 2 glasses a day for men. A glass of wine equals 5 oz (150 mL). This information is not intended to replace advice given to you by your health care provider. Make sure you discuss any questions you have with your health care provider. Document Released: 05/10/2016 Document Revised: 06/12/2016 Document Reviewed: 05/10/2016 Elsevier Interactive Patient Education  2017 [employer].

## 2023-12-04 ENCOUNTER — Other Ambulatory Visit: Payer: Self-pay

## 2023-12-04 DIAGNOSIS — I679 Cerebrovascular disease, unspecified: Secondary | ICD-10-CM

## 2023-12-13 NOTE — Progress Notes (Signed)
 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  Full report sent for scanning.   Would need small bowel enteroscopy at [facility]first available. Staff message sent to Darrin Luis  RG

## 2023-12-16 ENCOUNTER — Other Ambulatory Visit: Payer: Self-pay

## 2023-12-16 ENCOUNTER — Telehealth: Payer: Self-pay

## 2023-12-16 ENCOUNTER — Other Ambulatory Visit (INDEPENDENT_AMBULATORY_CARE_PROVIDER_SITE_OTHER)

## 2023-12-16 ENCOUNTER — Ambulatory Visit: Payer: PPO | Attending: Cardiology

## 2023-12-16 DIAGNOSIS — D500 Iron deficiency anemia secondary to blood loss (chronic): Secondary | ICD-10-CM

## 2023-12-16 DIAGNOSIS — R0609 Other forms of dyspnea: Secondary | ICD-10-CM | POA: Diagnosis not present

## 2023-12-16 DIAGNOSIS — I779 Disorder of arteries and arterioles, unspecified: Secondary | ICD-10-CM

## 2023-12-16 DIAGNOSIS — I6529 Occlusion and stenosis of unspecified carotid artery: Secondary | ICD-10-CM

## 2023-12-16 DIAGNOSIS — Z8679 Personal history of other diseases of the circulatory system: Secondary | ICD-10-CM

## 2023-12-16 LAB — CBC WITH DIFFERENTIAL/PLATELET
Basophils Absolute: 0.1 10*3/uL (ref 0.0–0.1)
Basophils Relative: 1.2 % (ref 0.0–3.0)
Eosinophils Absolute: 0.2 10*3/uL (ref 0.0–0.7)
Eosinophils Relative: 4.5 % (ref 0.0–5.0)
HCT: 34.7 % — ABNORMAL LOW (ref 39.0–52.0)
Hemoglobin: 11.1 g/dL — ABNORMAL LOW (ref 13.0–17.0)
Lymphocytes Relative: 25.1 % (ref 12.0–46.0)
Lymphs Abs: 1.4 10*3/uL (ref 0.7–4.0)
MCHC: 31.9 g/dL (ref 30.0–36.0)
MCV: 100.1 fl — ABNORMAL HIGH (ref 78.0–100.0)
Monocytes Absolute: 0.5 10*3/uL (ref 0.1–1.0)
Monocytes Relative: 9.7 % (ref 3.0–12.0)
Neutro Abs: 3.3 10*3/uL (ref 1.4–7.7)
Neutrophils Relative %: 59.5 % (ref 43.0–77.0)
Platelets: 123 10*3/uL — ABNORMAL LOW (ref 150.0–400.0)
RBC: 3.47 Mil/uL — ABNORMAL LOW (ref 4.22–5.81)
RDW: 20.8 % — ABNORMAL HIGH (ref 11.5–15.5)
WBC: 5.6 10*3/uL (ref 4.0–10.5)

## 2023-12-16 LAB — ECHOCARDIOGRAM COMPLETE: S' Lateral: 4.5 cm

## 2023-12-16 NOTE — Telephone Encounter (Signed)
 Pt  and pt wife was made aware of providers recommendations: Order for lab placed in Epic. Pt and pt wife made aware. Location to lab provided. Pt was notified that I would contact him at a later date to scheduled the procedure. Pt verbalized understanding with all questions answered.

## 2023-12-16 NOTE — Telephone Encounter (Signed)
-----   Message from Doree Albee sent at 12/14/2023  7:20 AM EDT ----- Regarding: RE: SBE and CBC Please put the cbc under my name for next week so it can just go to my in basket. Thanks!  ----- Message ----- From: Lynann Bologna, MD Sent: 12/13/2023   9:33 PM EDT To: Doree Albee, PA-C; Emeline Darling, RN Subject: SBE and CBC                                    Needs Push Enteroscopy with me at [facility] (for IDA d/t chronic blood loss, +VCE) Also, pl check CBC next week.   RG  Cc Amanda (pl keep a look out for him)

## 2023-12-17 ENCOUNTER — Other Ambulatory Visit: Payer: Self-pay

## 2023-12-17 ENCOUNTER — Telehealth: Payer: Self-pay

## 2023-12-17 DIAGNOSIS — D500 Iron deficiency anemia secondary to blood loss (chronic): Secondary | ICD-10-CM

## 2023-12-17 NOTE — Telephone Encounter (Signed)
 [facility] Pre-operative Risk Assessment     Request for surgical clearance:     Endoscopy Procedure  What type of surgery is being performed?     Enteroscopy  When is this surgery scheduled?     03/03/2024  What type of clearance is required ?   Pharmacy/ Cardiac   Are there any medications that need to be held prior to surgery and how long? Xarelto/ 2 days  Practice name and name of physician performing surgery?      [facility]/ Dr. Chales Abrahams  What is your office phone and fax number?      Phone- [phone]  Fax- [phone]  Anesthesia type (None, local, MAC, general) ?       MAC   Please route your response to Emeline Darling RN

## 2023-12-17 NOTE — Telephone Encounter (Signed)
 Pt and pt wife Karena Addison made aware of Dr. Chales Abrahams recommendations: Pt was ordered and scheduled for the Enteroscopy on 03/03/2024 at 7:30 AM at [facility] with Dr Chales Abrahams . Pt to arrive at 6:00 AM. Karena Addison made aware. Case ID [id number] Prep instructions were created and sent to pt via my chart.Karena Addison made aware. Cardiac Clearance and pharmacy clearance requested. Pt on xarelto. Karena Addison verbalized understanding with all questions answered.

## 2023-12-19 NOTE — Telephone Encounter (Signed)
 Preoperative team, patient has follow-up appointment scheduled for the beginning of April with Wallis Bamberg, NP-C.  Please add preoperative cardiac evaluation to appointment notes.  I will defer cardiac evaluation to appointment.  Pharmacy has provided recommendations for holding anticoagulation.    Thank you for your help.  Thomasene Ripple. Rahil Passey NP-C     12/19/2023, 12:47 PM [facility] [address] Office [phone] Fax [phone]

## 2023-12-19 NOTE — Telephone Encounter (Signed)
 Patient with diagnosis of A Fib on Xarelto for anticoagulation.    Procedure: Enteroscopy  Date of procedure: 03/03/24   CHA2DS2-VASc Score = 7  This indicates a 11.2% annual risk of stroke. The patient's score is based upon: CHF History: 0 HTN History: 1 Diabetes History: 1 Stroke History: 2 Vascular Disease History: 1 Age Score: 2 Gender Score: 0   CrCl 78 ml/min using adj body weight Platelet count 123K  Due to history of stroke, recommend patient hold Xarelto for 1 day prior to procedure  Patient /will not need bridging with Lovenox (enoxaparin) around procedure.  **This guidance is not considered finalized until pre-operative APP has relayed final recommendations.**

## 2023-12-20 ENCOUNTER — Encounter: Payer: Self-pay | Admitting: Gastroenterology

## 2024-01-01 NOTE — Progress Notes (Unsigned)
 Cardiology Office Note:  .   Date:  01/02/2024  ID:  Ryan Vaughan, DOB 06-22-1946, MRN [id number] PCP: Paulina Fusi, MD  [facility] Providers Cardiologist:  Garwin Brothers, MD    History of Present Illness: .   Ryan Vaughan is a 78 y.o. male with a past medical history of chronic atrial fibrillation, hypertension, carotid artery stenosis s/p carotid endarterectomy--follows with [facility] in [facility], renal artery aneurysm, CAD s/p CABG x 4, history of TIA, aneurysm of ascending aorta, emphysema, DM 2, dyslipidemia.  12/16/2023 echo EF 50-55%, moderate LVH, moderately elevated PASP, biatrial enlargement, mild MR, mild > mod TR, mild aortic regurgitation, aneurysm of ascending aorta 43 mm 01/23/2023 Lexiscan normal, low risk study, moderately reduced EF at 43% 12/21/2020 echo EF 55 to 60%, mild MR, mild dilatation ascending aorta 41 mm 09/20/2019 CABG x 4 LIMA to LAD, SVG to diagonal, sequential SVG to distal RCA and PL, clipping of atrial appendage 08/28/2019 left heart cath mid LAD 85% stenotic, circumflex 60 to 70% narrowing, mid to distal RCA 90 to 95% stenosis >> CVTS consultation  He was evaluated in the emergency department on 09/27/2023 for pedal edema and hemoglobin of 8.4.  He was given Lasix and advised follow-up with cardiology.  Evaluated 10/04/2023 following hospitalization, up, plans to see GI for evaluation of his anemia, he had not noticed any overt bleeding.  Evaluated by GI on 10/07/2023--plans for an upcoming capsule endoscopy.  Evaluated 10/30/2022 for concerns of shortness of breath and labile blood pressure readings.  He was follow-up with GI closely with plans for capsule endoscopy.  We repeated an echocardiogram and he was advised to follow-up in 3 months.    He presents today accompanied by his wife, overall he is feeling better.  He has began to work outside again on his property.  He is concerned about labile blood pressure readings and he keeps a blood  pressure log which we review and overall his blood pressure appears to be well-controlled, he does have some elevated readings sporadically. He denies chest pain, palpitations, dyspnea, pnd, orthopnea, n, v, dizziness, syncope, edema, weight gain, or early satiety.    ROS: Review of Systems  Constitutional:  Positive for malaise/fatigue.  Cardiovascular:  Positive for leg swelling.  All other systems reviewed and are negative.    Studies Reviewed: .        Cardiac Studies & Procedures   ______________________________________________________________________________________________ CARDIAC CATHETERIZATION  CARDIAC CATHETERIZATION 08/28/2019  Narrative  Widely patent left main coronary artery  Mid LAD contains eccentric 85% stenosis after the first diagonal.  The first diagonal is small and contains ostial 90% stenosis.  A large ramus intermedius arises from the distal left main but is free of significant obstruction.  Circumflex gives origin to a small first obtuse marginal, in the mid vessel there is eccentric 60 to 70% narrowing.  2 smaller obtuse marginals arise distally and supplies collaterals to a large right coronary left ventricular branch which apparently has been totally occluded for greater than 10 years.  The right coronary artery is large.  It is totally occluded in the continuation after a small left ventricular branch arises.  The distal vessel is supplied by collaterals from left circumflex to the right coronary.  The mid to distal right coronary contains 90 to 95% eccentric angulated stenosis that enters a fusiform aneurysm =/> 4.5 mm in diameter).  The native vessel pre and post aneurysm is 3.5 mm.  The PDA is large  and graftable.  Overall normal left ventricular function with EF 45 to 50%.  Normal LVEDP.  RECOMMENDATIONS:   TCTS consultation for consideration of multivessel grafting.  Significant blood pressure elevation was present this morning because no  antihypertensive therapy was taken in the past 24 hours.  IV labetalol was given to improve blood pressure.  He should take his blood pressure medication as soon as he arrives home.  Xarelto should be resumed later this evening or in a.m. depending upon when he ordinarily takes it.  He should not take the Xarelto before he has had antihypertensive therapy.  Findings Coronary Findings Diagnostic  Dominance: Right  Left Anterior Descending Mid LAD lesion is 80% stenosed.  First Diagonal Branch Vessel is small in size. 1st Diag lesion is 80% stenosed.  Left Circumflex Prox Cx to Mid Cx lesion is 65% stenosed.  First Obtuse Marginal Branch Vessel is small in size.  Right Coronary Artery Prox RCA to Mid RCA lesion is 55% stenosed. Mid RCA lesion is 95% stenosed. Non-stenotic Mid RCA to Dist RCA lesion.  Right Posterior Atrioventricular Artery RPAV lesion is 100% stenosed.  Third Right Posterolateral Branch Vessel is large in size. Collaterals 3rd RPL filled by collaterals from 3rd Mrg.  Intervention  No interventions have been documented.   STRESS TESTS  MYOCARDIAL PERFUSION IMAGING 01/23/2023  Narrative   The study is normal. The study is low risk.   Left ventricular function is abnormal. Global function is mioderately reduced. Nuclear stress EF: 43 %. The left ventricular ejection fraction is moderately decreased (30-44%). End diastolic cavity size is normal.   Prior study available for comparison from 12/14/2020.   ECHOCARDIOGRAM  ECHOCARDIOGRAM COMPLETE 12/16/2023  Narrative ECHOCARDIOGRAM REPORT    Patient Name:   Ryan Vaughan Date of Exam: 12/16/2023 Medical Rec #:  [id number]       Height:       76.0 in Accession #:    [id number]      Weight:       315.0 lb Date of Birth:  [birth year]        BSA:          2.687 m Patient Age:    77 years        BP:           123/63 mmHg Patient Gender: M               HR:           65 bpm. Exam Location:   [place]  Procedure: 2D Echo, Cardiac Doppler, Color Doppler and Strain Analysis (Both Spectral and Color Flow Doppler were utilized during procedure).  Indications:    DOE (dyspnea on exertion) [R06.09 (ICD-10-CM)]  History:        Patient has prior history of Echocardiogram examinations, most recent 12/21/2020. CAD, TIA and Stroke; Risk Factors:Hypertension, Diabetes and Dyslipidemia.  Sonographer:    Louie Boston RDCS Referring Phys: [phone] Darden Dates Monique Hefty  IMPRESSIONS   1. Left ventricular ejection fraction, by estimation, is 50 to 55%. The left ventricle has low normal function. The left ventricle has no regional wall motion abnormalities. The left ventricular internal cavity size was mildly dilated. There is moderate left ventricular hypertrophy. Left ventricular diastolic parameters are indeterminate. The average left ventricular global longitudinal strain is -9.5 %. The global longitudinal strain is abnormal. 2. Right ventricular systolic function is normal. The right ventricular size is normal. There is moderately elevated pulmonary artery systolic pressure. 3. Left atrial  size was mild to moderately dilated. 4. Right atrial size was moderately dilated. 5. The mitral valve is normal in structure. Mild mitral valve regurgitation. No evidence of mitral stenosis. 6. Tricuspid valve regurgitation is mild to moderate. 7. The aortic valve is calcified. There is mild calcification of the aortic valve. There is mild thickening of the aortic valve. Aortic valve regurgitation is mild. No aortic stenosis is present. 8. Aneurysm of the ascending aorta, measuring 43 mm. 9. The inferior vena cava is normal in size with greater than 50% respiratory variability, suggesting right atrial pressure of 3 mmHg.  FINDINGS Left Ventricle: Left ventricular ejection fraction, by estimation, is 50 to 55%. The left ventricle has low normal function. The left ventricle has no regional wall motion  abnormalities. The average left ventricular global longitudinal strain is -9.5 %. Strain was performed and the global longitudinal strain is abnormal. The left ventricular internal cavity size was mildly dilated. There is moderate left ventricular hypertrophy. Left ventricular diastolic parameters are indeterminate.  Right Ventricle: The right ventricular size is normal. No increase in right ventricular wall thickness. Right ventricular systolic function is normal. There is moderately elevated pulmonary artery systolic pressure. The tricuspid regurgitant velocity is 3.16 m/s, and with an assumed right atrial pressure of 8 mmHg, the estimated right ventricular systolic pressure is 47.9 mmHg.  Left Atrium: Left atrial size was mild to moderately dilated.  Right Atrium: Right atrial size was moderately dilated.  Pericardium: There is no evidence of pericardial effusion.  Mitral Valve: The mitral valve is normal in structure. Mild mitral valve regurgitation. No evidence of mitral valve stenosis.  Tricuspid Valve: The tricuspid valve is normal in structure. Tricuspid valve regurgitation is mild to moderate. No evidence of tricuspid stenosis.  Aortic Valve: The aortic valve is calcified. There is mild calcification of the aortic valve. There is mild thickening of the aortic valve. Aortic valve regurgitation is mild. No aortic stenosis is present.  Pulmonic Valve: The pulmonic valve was normal in structure. Pulmonic valve regurgitation is not visualized. No evidence of pulmonic stenosis.  Aorta: The aortic root is normal in size and structure. There is an aneurysm involving the ascending aorta measuring 43 mm.  Venous: The inferior vena cava is normal in size with greater than 50% respiratory variability, suggesting right atrial pressure of 3 mmHg.  IAS/Shunts: No atrial level shunt detected by color flow Doppler.  Additional Comments: 3D was performed not requiring image post processing on an  independent workstation and was indeterminate.   LEFT VENTRICLE PLAX 2D LVIDd:         6.10 cm   Diastology LVIDs:         4.50 cm   LV e' medial:    5.44 cm/s LV PW:         1.50 cm   LV E/e' medial:  20.2 LV IVS:        1.50 cm   LV e' lateral:   8.59 cm/s LVOT diam:     2.60 cm   LV E/e' lateral: 12.8 LV SV:         96 LV SV Index:   36        2D Longitudinal Strain LVOT Area:     5.31 cm  2D Strain GLS Avg:     -9.5 %   RIGHT VENTRICLE            IVC RV Basal diam:  5.30 cm    IVC diam: 2.20 cm RV  S prime:     8.16 cm/s TAPSE (M-mode): 1.6 cm  LEFT ATRIUM              Index        RIGHT ATRIUM           Index LA diam:        5.10 cm  1.90 cm/m   RA Area:     34.00 cm LA Vol (A2C):   138.0 ml 51.36 ml/m  RA Volume:   133.00 ml 49.50 ml/m LA Vol (A4C):   98.8 ml  36.77 ml/m LA Biplane Vol: 119.0 ml 44.29 ml/m AORTIC VALVE LVOT Vmax:   82.00 cm/s LVOT Vmean:  53.750 cm/s LVOT VTI:    0.181 m  AORTA Ao Root diam: 3.90 cm Ao Asc diam:  4.25 cm Ao Desc diam: 2.90 cm  MV E velocity: 110.00 cm/s  TRICUSPID VALVE TR Peak grad:   39.9 mmHg TR Vmax:        316.00 cm/s  SHUNTS Systemic VTI:  0.18 m Systemic Diam: 2.60 cm  Gypsy Balsam MD Electronically signed by Gypsy Balsam MD Signature Date/Time: 12/16/2023/9:53:27 AM    Final   TEE  ECHO INTRAOPERATIVE TEE 09/10/2019  Narrative *INTRAOPERATIVE TRANSESOPHAGEAL REPORT *    Patient Name:   Joachim NAKAI POLLIO Date of Exam: 09/10/2019 Medical Rec #:  [id number]       Height:       76.0 in Accession #:    [id number]      Weight:       294.0 lb Date of Birth:  10/31/1945        BSA:          2.61 m Patient Age:    73 years        BP:           174/91 mmHg Patient Gender: M               HR:           75 bpm. Exam Location:  Anesthesiology  Transesophogeal exam was perform intraoperatively during surgical procedure. Patient was closely monitored under general anesthesia during the entirety  of examination.  Indications:     Coronary artery disease Sonographer:     Tonia Ghent RDCS Performing Phys: Karna Christmas MD Diagnosing Phys: Karna Christmas MD  Complications: No known complications during this procedure. POST-OP IMPRESSIONS Overall, there were no significant changes from pre-bypass. - Right Ventricle: The right ventricle appears unchanged from pre-bypass. - Aorta: The aorta appears unchanged from pre-bypass. - Left Atrium: The left atrial appendage has been clipped. - Aortic Valve: The aortic valve appears unchanged from pre-bypass. - Mitral Valve: There is mild regurgitation. - Tricuspid Valve: There is mild regurgitation. - Interatrial Septum: The interatrial septum appears unchanged from pre-bypass. - Interventricular Septum: The interventricular septum appears unchanged from pre-bypass. - Pericardium: The pericardium appears unchanged from pre-bypass.  PRE-OP FINDINGS Left Ventricle: The left ventricle has low normal systolic function, with an ejection fraction of 50-55%. The cavity size was moderately dilated. There is severely increased left ventricular wall thickness.  Right Ventricle: The right ventricle has normal systolic function. The cavity was mildly enlarged. There is increased right ventricular wall thickness.  Left Atrium: Left atrial size was dilated.  Right Atrium: Right atrial size was dilated.  Interatrial Septum: No atrial level shunt detected by color flow Doppler.  Pericardium: There is no evidence of pericardial effusion.  Mitral Valve: The mitral valve  is normal in structure. Mitral valve regurgitation is not visualized by color flow Doppler.  Tricuspid Valve: The tricuspid valve was normal in structure. Tricuspid valve regurgitation was not visualized by color flow Doppler.  Aortic Valve: The aortic valve is normal in structure. There is mild thickening of the aortic valve and there is mild calcification of the aortic valve.  Aortic valve regurgitation is trivial by color flow Doppler. There is no evidence of aortic valve stenosis.  Pulmonic Valve: The pulmonic valve was normal in structure. Pulmonic valve regurgitation is not visualized by color flow Doppler.   Aorta: The aortic root, ascending aorta and aortic arch are normal in size and structure. There is evidence of plaque in the descending aorta.   Karna Christmas MD Electronically signed by Karna Christmas MD Signature Date/Time: 09/10/2019/2:47:01 PM    Final  MONITORS  LONG TERM MONITOR (3-14 DAYS) 07/29/2019  Narrative Ryan Vaughan, DOB 15-May-1946, MRN [id number]  EVENT MONITOR REPORT:   Patient was monitored from 07/15/2019 to 07/18/2019. Indication:                    Atrial fibrillation Ordering physician:  Garwin Brothers, MD Referring physician:  Garwin Brothers, MD   Baseline rhythm: Atrial fibrillation  Minimum heart rate: 34 BPM.  Average heart rate: 68 BPM.  Maximal heart rate 155 BPM.  Atrial arrhythmia: Patient remained in atrial fibrillation throughout the monitoring  Ventricular arrhythmia: Few PVCs.  One 7 beat nonsustained ventricular tachycardia.  Conduction abnormality: None significant  Symptoms: None significant   Conclusion: Abnormal event monitor.  Patient remained in atrial fibrillation with fairly controlled ventricular rates.  One 7 beat nonsustained ventricular tachycardia as mentioned above.  Interpreting  cardiologist: Garwin Brothers, MD Date: 08/05/2019 2:57 PM       ______________________________________________________________________________________________      Risk Assessment/Calculations:    CHA2DS2-VASc Score = 7   This indicates a 11.2% annual risk of stroke. The patient's score is based upon: CHF History: 0 HTN History: 1 Diabetes History: 1 Stroke History: 2 Vascular Disease History: 1 Age Score: 2 Gender Score: 0         Physical Exam:   VS:  BP 120/74   Pulse 94    Ht 6\' 4"  (1.93 m)   Wt (!) 315 lb (142.9 kg)   SpO2 99%   BMI 38.34 kg/m    Wt Readings from Last 3 Encounters:  01/02/24 (!) 315 lb (142.9 kg)  12/03/23 (!) 315 lb (142.9 kg)  10/31/23 (!) 315 lb 9.6 oz (143.2 kg)    GEN: Well nourished, well developed in no acute distress NECK: No JVD; No carotid bruits CARDIAC: irregularly irregular, no murmurs, rubs, gallops RESPIRATORY:  Clear to auscultation without rales, wheezing or rhonchi  ABDOMEN: Soft, non-tender, non-distended EXTREMITIES:  no pedal edema; No deformity   ASSESSMENT AND PLAN: .   CAD -s/p CABG x 4 in 2020 >> Lexiscan in April 2024 was normal, low risk study.  Stable with no anginal symptoms. No indication for ischemic evaluation.  Continue nitroglycerin as needed, continue Coreg 12.5 mg twice daily.   Atrial fibrillation/hypercoagulable state-currently on Xarelto 20 mg daily, hemoglobin with recent drop 8.4 > 8.5 > 8.5  > 8.4 > 11.1 currently stable. CHA2DS2-VASc score of 7, we will continue Xarelto for now and continue to trend his CBCs.  Currently on Protonix 40 mg daily, following with GI.  Continue Coreg 12.5 mg twice daily.   Pedal edema -  will continue lasix 20 mg daily for now, no appreciable pedal edema.   Anemia -denies frank bleeding, following with GI with upcoming capsule endoscopy, currently on Protonix 40 mg daily.  HTN -blood pressures 120/74, concerned with fluctuation of blood pressure readings throughout the day.  Will have him keep a blood pressure log for 2 weeks.  For now, continue lisinopril 10 mg daily, continue Coreg 12.5 mg twice daily.  Aneurysm of ascending aorta-stable at 43 mm, CT of chest arranged in 1 year for surveillance.       Dispo: follow up in 4 months.   Signed, Flossie Dibble, NP

## 2024-01-02 ENCOUNTER — Ambulatory Visit: Payer: PPO | Attending: Cardiology | Admitting: Cardiology

## 2024-01-02 ENCOUNTER — Encounter: Payer: Self-pay | Admitting: Cardiology

## 2024-01-02 VITALS — BP 120/74 | HR 94 | Ht 76.0 in | Wt 315.0 lb

## 2024-01-02 DIAGNOSIS — F331 Major depressive disorder, recurrent, moderate: Secondary | ICD-10-CM | POA: Diagnosis not present

## 2024-01-02 DIAGNOSIS — I4821 Permanent atrial fibrillation: Secondary | ICD-10-CM

## 2024-01-02 DIAGNOSIS — M109 Gout, unspecified: Secondary | ICD-10-CM | POA: Diagnosis not present

## 2024-01-02 DIAGNOSIS — E1165 Type 2 diabetes mellitus with hyperglycemia: Secondary | ICD-10-CM | POA: Diagnosis not present

## 2024-01-02 DIAGNOSIS — I779 Disorder of arteries and arterioles, unspecified: Secondary | ICD-10-CM

## 2024-01-02 DIAGNOSIS — G300 Alzheimer's disease with early onset: Secondary | ICD-10-CM | POA: Diagnosis not present

## 2024-01-02 DIAGNOSIS — I4891 Unspecified atrial fibrillation: Secondary | ICD-10-CM | POA: Diagnosis not present

## 2024-01-02 DIAGNOSIS — I10 Essential (primary) hypertension: Secondary | ICD-10-CM | POA: Diagnosis not present

## 2024-01-02 DIAGNOSIS — I2510 Atherosclerotic heart disease of native coronary artery without angina pectoris: Secondary | ICD-10-CM | POA: Diagnosis not present

## 2024-01-02 DIAGNOSIS — D62 Acute posthemorrhagic anemia: Secondary | ICD-10-CM | POA: Diagnosis not present

## 2024-01-02 DIAGNOSIS — D6859 Other primary thrombophilia: Secondary | ICD-10-CM

## 2024-01-02 DIAGNOSIS — F0280 Dementia in other diseases classified elsewhere without behavioral disturbance: Secondary | ICD-10-CM | POA: Diagnosis not present

## 2024-01-02 DIAGNOSIS — E785 Hyperlipidemia, unspecified: Secondary | ICD-10-CM | POA: Diagnosis not present

## 2024-01-02 DIAGNOSIS — E1129 Type 2 diabetes mellitus with other diabetic kidney complication: Secondary | ICD-10-CM | POA: Diagnosis not present

## 2024-01-02 DIAGNOSIS — Z951 Presence of aortocoronary bypass graft: Secondary | ICD-10-CM

## 2024-01-02 DIAGNOSIS — I7121 Aneurysm of the ascending aorta, without rupture: Secondary | ICD-10-CM

## 2024-01-02 NOTE — Patient Instructions (Signed)
 Medication Instructions:  Your physician recommends that you continue on your current medications as directed. Please refer to the Current Medication list given to you today.  *If you need a refill on your cardiac medications before your next appointment, please call your pharmacy*   Lab Work: None Ordered If you have labs (blood work) drawn today and your tests are completely normal, you will receive your results only by: MyChart Message (if you have MyChart) OR A paper copy in the mail If you have any lab test that is abnormal or we need to change your treatment, we will call you to review the results.   Testing/Procedures: None Ordered   Follow-Up: At [facility], you and your health needs are our priority.  As part of our continuing mission to provide you with exceptional heart care, we have created designated Provider Care Teams.  These Care Teams include your primary Cardiologist (physician) and Advanced Practice Providers (APPs -  Physician Assistants and Nurse Practitioners) who all work together to provide you with the care you need, when you need it.  We recommend signing up for the patient portal called "MyChart".  Sign up information is provided on this After Visit Summary.  MyChart is used to connect with patients for Virtual Visits (Telemedicine).  Patients are able to view lab/test results, encounter notes, upcoming appointments, etc.  Non-urgent messages can be sent to your provider as well.   To learn more about what you can do with MyChart, go to [email or web address].    Your next appointment:   4 month follow up

## 2024-01-07 ENCOUNTER — Ambulatory Visit
Admission: RE | Admit: 2024-01-07 | Discharge: 2024-01-07 | Disposition: A | Discharge status: Home / Self Care | Source: Ambulatory Visit | Attending: Physician Assistant | Admitting: Physician Assistant

## 2024-01-07 DIAGNOSIS — I6503 Occlusion and stenosis of bilateral vertebral arteries: Secondary | ICD-10-CM | POA: Diagnosis not present

## 2024-01-07 DIAGNOSIS — I6522 Occlusion and stenosis of left carotid artery: Secondary | ICD-10-CM | POA: Diagnosis not present

## 2024-01-07 MED ORDER — GADOPICLENOL 0.5 MMOL/ML IV SOLN
10.0000 mL | Freq: Once | INTRAVENOUS | Status: AC | PRN
  Administered 2024-01-07: 10 mL via INTRAVENOUS

## 2024-01-28 DIAGNOSIS — M170 Bilateral primary osteoarthritis of knee: Secondary | ICD-10-CM | POA: Diagnosis not present

## 2024-01-30 NOTE — Progress Notes (Signed)
 Stable MRA neck and head when compared to the prior one last year, no changes. Thank you

## 2024-02-14 ENCOUNTER — Telehealth: Payer: Self-pay

## 2024-02-14 NOTE — Telephone Encounter (Signed)
 Please see notes below Please review and advise

## 2024-02-14 NOTE — Telephone Encounter (Signed)
 Ryan Vaughan

## 2024-02-25 ENCOUNTER — Telehealth: Payer: Self-pay

## 2024-02-25 NOTE — Telephone Encounter (Signed)
 Procedure:ENTEROSCOPY Procedure date: 03/03/24 Procedure location: [facility] Arrival Time: 6:45 Spoke with the patient Y/N: Y Any prep concerns? N  Has the patient obtained the prep from the pharmacy ? N Do you have a care partner and transportation: Y Any additional concerns? N

## 2024-02-25 NOTE — Telephone Encounter (Signed)
 Reviewed as Pattricia Bores, NP out of office. At visit 01/02/24 doing well from cardiac perspective with no angina. Per AHA/ACC guidelines, he is deemed acceptable risk for the planned procedure ( endoscopy) without additional cardiovascular testing.   Katherleen Folkes S Castin Donaghue, NP

## 2024-02-25 NOTE — Telephone Encounter (Signed)
   Patient Name: [name]  DOB: [birth year] MRN: [id number]  Primary Cardiologist: Nelia Balzarine, MD  Chart reviewed as part of pre-operative protocol coverage. Given past medical history and time since last visit, based on ACC/AHA guidelines, Ryan Vaughan is at acceptable risk for the planned procedure without further cardiovascular testing.   Due to history of stroke, recommend patient hold Xarelto  for 1 day prior to procedure   Patient /will not need bridging with Lovenox  (enoxaparin ) around procedure.  The patient was advised that if he develops new symptoms prior to surgery to contact our office to arrange for a follow-up visit, and he verbalized understanding.  I will route this recommendation to the requesting party via Epic fax function and remove from pre-op pool.  Please call with questions.  Francene Ing, Retha Cast, NP 02/25/2024, 10:17 AM

## 2024-02-25 NOTE — Telephone Encounter (Signed)
Please see notes below and advise 

## 2024-02-25 NOTE — Telephone Encounter (Signed)
 Good Morning Dr. Bridget[facility]  We have received a surgical clearance request for Mr. Hallenbeck for an enteroscopy procedure.  They were seen recently in clinic on 01/02/2024. Can you please comment on surgical clearance. Please forward you guidance and recommendations to P CV DIV PREOP   Thank you, Charles Connor, NP

## 2024-02-26 DIAGNOSIS — H524 Presbyopia: Secondary | ICD-10-CM | POA: Diagnosis not present

## 2024-02-26 DIAGNOSIS — E119 Type 2 diabetes mellitus without complications: Secondary | ICD-10-CM | POA: Diagnosis not present

## 2024-02-26 NOTE — Telephone Encounter (Signed)
 Pt wife Delores Fester made ware to hold Xarelto  for 1 day prior to procedure. Delores Fester verbalized understanding with all questions answered.

## 2024-02-27 ENCOUNTER — Encounter (HOSPITAL_COMMUNITY): Payer: Self-pay | Admitting: Gastroenterology

## 2024-02-27 NOTE — Progress Notes (Signed)
 Attempted to obtain medical history for pre op call via telephone, unable to reach at this time. HIPAA compliant voicemail message left requesting return call to pre surgical testing department.

## 2024-03-02 NOTE — Anesthesia Preprocedure Evaluation (Addendum)
 Anesthesia Evaluation  Patient identified by MRN, date of birth, ID band Patient awake    Reviewed: Allergy & Precautions, NPO status , Patient's Chart, lab work & pertinent test results  History of Anesthesia Complications Negative for: history of anesthetic complications  Airway Mallampati: I  TM Distance: >3 FB Neck ROM: Full   Comment: Previous grade I view with MAC 4, easy mask Dental  (+) Dental Advisory Given, Poor Dentition   Pulmonary neg shortness of breath, neg sleep apnea, COPD, neg recent URI   Pulmonary exam normal breath sounds clear to auscultation       Cardiovascular hypertension (carvedilol , lisinopril , furosemide ), Pt. on home beta blockers (-) angina + CAD and + CABG (x4 09/10/2019)  + dysrhythmias Atrial Fibrillation + Valvular Problems/Murmurs (mild MR and AI, mild-to-moderate TR)  Rhythm:Regular Rate:Normal  HLD, s/p CEA, aneurysm of ascending aorta  TTE 12/16/2023: IMPRESSIONS    1. Left ventricular ejection fraction, by estimation, is 50 to 55%. The  left ventricle has low normal function. The left ventricle has no regional  wall motion abnormalities. The left ventricular internal cavity size was  mildly dilated. There is moderate   left ventricular hypertrophy. Left ventricular diastolic parameters are  indeterminate. The average left ventricular global longitudinal strain is  -9.5 %. The global longitudinal strain is abnormal.   2. Right ventricular systolic function is normal. The right ventricular  size is normal. There is moderately elevated pulmonary artery systolic  pressure.   3. Left atrial size was mild to moderately dilated.   4. Right atrial size was moderately dilated.   5. The mitral valve is normal in structure. Mild mitral valve  regurgitation. No evidence of mitral stenosis.   6. Tricuspid valve regurgitation is mild to moderate.   7. The aortic valve is calcified. There is mild  calcification of the  aortic valve. There is mild thickening of the aortic valve. Aortic valve  regurgitation is mild. No aortic stenosis is present.   8. Aneurysm of the ascending aorta, measuring 43 mm.   9. The inferior vena cava is normal in size with greater than 50%  respiratory variability, suggesting right atrial pressure of 3 mmHg.   Low-risk stress test 01/23/2023    Neuro/Psych neg Seizures PSYCHIATRIC DISORDERS  Depression    TIA Neuromuscular disease (neuropathy) CVA (left PCA 03/2023), No Residual Symptoms    GI/Hepatic Neg liver ROS,GERD  Medicated,,  Endo/Other  diabetes, Type 2, Insulin  Dependent, Oral Hypoglycemic Agents    Renal/GU Renal artery aneurysm     Musculoskeletal  (+) Arthritis , Osteoarthritis,    Abdominal  (+) + obese  Peds  Hematology  (+) Blood dyscrasia, anemia Lab Results      Component                Value               Date                      WBC                      5.6                 12/16/2023                HGB                      11.1 (L)  12/16/2023                HCT                      34.7 (L)            12/16/2023                MCV                      100.1 (H)           12/16/2023                PLT                      123.0 (L)           12/16/2023              Anesthesia Other Findings Last Xarelto : 03/01/2024  Reproductive/Obstetrics                             Anesthesia Physical Anesthesia Plan  ASA: 3  Anesthesia Plan: MAC   Post-op Pain Management: Minimal or no pain anticipated   Induction: Intravenous  PONV Risk Score and Plan: 1 and Propofol  infusion, TIVA and Treatment may vary due to age or medical condition  Airway Management Planned: Natural Airway and Simple Face Mask  Additional Equipment:   Intra-op Plan:   Post-operative Plan:   Informed Consent: I have reviewed the patients History and Physical, chart, labs and discussed the procedure including the  risks, benefits and alternatives for the proposed anesthesia with the patient or authorized representative who has indicated his/her understanding and acceptance.     Dental advisory given  Plan Discussed with: CRNA and Anesthesiologist  Anesthesia Plan Comments: (Discussed with patient risks of MAC including, but not limited to, minor pain or discomfort, hearing people in the room, and possible need for backup general anesthesia. Risks for general anesthesia also discussed including, but not limited to, sore throat, hoarse voice, chipped/damaged teeth, injury to vocal cords, nausea and vomiting, allergic reactions, lung infection, heart attack, stroke, and death. All questions answered. )       Anesthesia Quick Evaluation

## 2024-03-03 ENCOUNTER — Encounter (HOSPITAL_COMMUNITY)
Admission: RE | Disposition: A | Discharge status: Home / Self Care | Payer: Self-pay | Source: Home / Self Care | Attending: Gastroenterology

## 2024-03-03 ENCOUNTER — Ambulatory Visit (HOSPITAL_COMMUNITY): Admitting: Anesthesiology

## 2024-03-03 ENCOUNTER — Ambulatory Visit (HOSPITAL_COMMUNITY)
Admission: RE | Admit: 2024-03-03 | Discharge: 2024-03-03 | Disposition: A | Discharge status: Home / Self Care | Attending: Gastroenterology | Admitting: Gastroenterology

## 2024-03-03 ENCOUNTER — Encounter (HOSPITAL_COMMUNITY): Payer: Self-pay | Admitting: Gastroenterology

## 2024-03-03 ENCOUNTER — Other Ambulatory Visit: Payer: Self-pay

## 2024-03-03 DIAGNOSIS — Z7901 Long term (current) use of anticoagulants: Secondary | ICD-10-CM | POA: Insufficient documentation

## 2024-03-03 DIAGNOSIS — Z7984 Long term (current) use of oral hypoglycemic drugs: Secondary | ICD-10-CM | POA: Diagnosis not present

## 2024-03-03 DIAGNOSIS — F32A Depression, unspecified: Secondary | ICD-10-CM | POA: Insufficient documentation

## 2024-03-03 DIAGNOSIS — D508 Other iron deficiency anemias: Secondary | ICD-10-CM | POA: Insufficient documentation

## 2024-03-03 DIAGNOSIS — I2510 Atherosclerotic heart disease of native coronary artery without angina pectoris: Secondary | ICD-10-CM | POA: Diagnosis not present

## 2024-03-03 DIAGNOSIS — R195 Other fecal abnormalities: Secondary | ICD-10-CM | POA: Diagnosis not present

## 2024-03-03 DIAGNOSIS — D500 Iron deficiency anemia secondary to blood loss (chronic): Secondary | ICD-10-CM

## 2024-03-03 DIAGNOSIS — I4891 Unspecified atrial fibrillation: Secondary | ICD-10-CM

## 2024-03-03 DIAGNOSIS — I4821 Permanent atrial fibrillation: Secondary | ICD-10-CM | POA: Diagnosis not present

## 2024-03-03 DIAGNOSIS — D649 Anemia, unspecified: Secondary | ICD-10-CM | POA: Insufficient documentation

## 2024-03-03 DIAGNOSIS — K2970 Gastritis, unspecified, without bleeding: Secondary | ICD-10-CM | POA: Diagnosis not present

## 2024-03-03 DIAGNOSIS — Z794 Long term (current) use of insulin: Secondary | ICD-10-CM | POA: Insufficient documentation

## 2024-03-03 DIAGNOSIS — M1990 Unspecified osteoarthritis, unspecified site: Secondary | ICD-10-CM | POA: Diagnosis not present

## 2024-03-03 DIAGNOSIS — K2960 Other gastritis without bleeding: Secondary | ICD-10-CM | POA: Diagnosis not present

## 2024-03-03 DIAGNOSIS — Z951 Presence of aortocoronary bypass graft: Secondary | ICD-10-CM | POA: Diagnosis not present

## 2024-03-03 DIAGNOSIS — I10 Essential (primary) hypertension: Secondary | ICD-10-CM | POA: Insufficient documentation

## 2024-03-03 DIAGNOSIS — Z79899 Other long term (current) drug therapy: Secondary | ICD-10-CM | POA: Insufficient documentation

## 2024-03-03 DIAGNOSIS — Z860101 Personal history of adenomatous and serrated colon polyps: Secondary | ICD-10-CM | POA: Diagnosis not present

## 2024-03-03 DIAGNOSIS — Z7982 Long term (current) use of aspirin: Secondary | ICD-10-CM | POA: Diagnosis not present

## 2024-03-03 DIAGNOSIS — J439 Emphysema, unspecified: Secondary | ICD-10-CM

## 2024-03-03 DIAGNOSIS — E1140 Type 2 diabetes mellitus with diabetic neuropathy, unspecified: Secondary | ICD-10-CM | POA: Diagnosis not present

## 2024-03-03 DIAGNOSIS — K259 Gastric ulcer, unspecified as acute or chronic, without hemorrhage or perforation: Secondary | ICD-10-CM | POA: Diagnosis not present

## 2024-03-03 DIAGNOSIS — E1151 Type 2 diabetes mellitus with diabetic peripheral angiopathy without gangrene: Secondary | ICD-10-CM | POA: Insufficient documentation

## 2024-03-03 DIAGNOSIS — E785 Hyperlipidemia, unspecified: Secondary | ICD-10-CM | POA: Insufficient documentation

## 2024-03-03 DIAGNOSIS — D509 Iron deficiency anemia, unspecified: Secondary | ICD-10-CM

## 2024-03-03 DIAGNOSIS — Z8673 Personal history of transient ischemic attack (TIA), and cerebral infarction without residual deficits: Secondary | ICD-10-CM | POA: Insufficient documentation

## 2024-03-03 DIAGNOSIS — I7121 Aneurysm of the ascending aorta, without rupture: Secondary | ICD-10-CM | POA: Diagnosis not present

## 2024-03-03 DIAGNOSIS — K2950 Unspecified chronic gastritis without bleeding: Secondary | ICD-10-CM | POA: Diagnosis not present

## 2024-03-03 DIAGNOSIS — K219 Gastro-esophageal reflux disease without esophagitis: Secondary | ICD-10-CM | POA: Insufficient documentation

## 2024-03-03 HISTORY — PX: ENTEROSCOPY: SHX5533

## 2024-03-03 HISTORY — DX: Iron deficiency anemia secondary to blood loss (chronic): D50.0

## 2024-03-03 LAB — CBC WITH DIFFERENTIAL/PLATELET
Abs Immature Granulocytes: 0.03 10*3/uL (ref 0.00–0.07)
Basophils Absolute: 0.1 10*3/uL (ref 0.0–0.1)
Basophils Relative: 1 %
Eosinophils Absolute: 0.2 10*3/uL (ref 0.0–0.5)
Eosinophils Relative: 3 %
HCT: 37.7 % — ABNORMAL LOW (ref 39.0–52.0)
Hemoglobin: 12.2 g/dL — ABNORMAL LOW (ref 13.0–17.0)
Immature Granulocytes: 1 %
Lymphocytes Relative: 26 %
Lymphs Abs: 1.4 10*3/uL (ref 0.7–4.0)
MCH: 32.9 pg (ref 26.0–34.0)
MCHC: 32.4 g/dL (ref 30.0–36.0)
MCV: 101.6 fL — ABNORMAL HIGH (ref 80.0–100.0)
Monocytes Absolute: 0.5 10*3/uL (ref 0.1–1.0)
Monocytes Relative: 10 %
Neutro Abs: 3.1 10*3/uL (ref 1.7–7.7)
Neutrophils Relative %: 59 %
Platelets: 127 10*3/uL — ABNORMAL LOW (ref 150–400)
RBC: 3.71 MIL/uL — ABNORMAL LOW (ref 4.22–5.81)
RDW: 13.8 % (ref 11.5–15.5)
WBC: 5.3 10*3/uL (ref 4.0–10.5)
nRBC: 0 % (ref 0.0–0.2)

## 2024-03-03 LAB — COMPREHENSIVE METABOLIC PANEL WITH GFR
ALT: 14 U/L (ref 0–44)
AST: 20 U/L (ref 15–41)
Albumin: 3.5 g/dL (ref 3.5–5.0)
Alkaline Phosphatase: 71 U/L (ref 38–126)
Anion gap: 9 (ref 5–15)
BUN: 23 mg/dL (ref 8–23)
CO2: 26 mmol/L (ref 22–32)
Calcium: 8.7 mg/dL — ABNORMAL LOW (ref 8.9–10.3)
Chloride: 105 mmol/L (ref 98–111)
Creatinine, Ser: 1.29 mg/dL — ABNORMAL HIGH (ref 0.61–1.24)
GFR, Estimated: 57 mL/min — ABNORMAL LOW (ref >60)
Glucose, Bld: 205 mg/dL — ABNORMAL HIGH (ref 70–99)
Potassium: 3.7 mmol/L (ref 3.5–5.1)
Sodium: 140 mmol/L (ref 135–145)
Total Bilirubin: 0.6 mg/dL (ref 0.0–1.2)
Total Protein: 6.7 g/dL (ref 6.5–8.1)

## 2024-03-03 LAB — GLUCOSE, CAPILLARY: Glucose-Capillary: 164 mg/dL — ABNORMAL HIGH (ref 70–99)

## 2024-03-03 SURGERY — ENTEROSCOPY
Anesthesia: Monitor Anesthesia Care

## 2024-03-03 MED ORDER — SODIUM CHLORIDE 0.9 % IV SOLN: INTRAVENOUS | Status: DC

## 2024-03-03 MED ORDER — GLUCAGON HCL RDNA (DIAGNOSTIC) 1 MG IJ SOLR
INTRAMUSCULAR | Status: DC | PRN
  Administered 2024-03-03: .25 mg via INTRAVENOUS

## 2024-03-03 MED ORDER — LIDOCAINE 2% (20 MG/ML) 5 ML SYRINGE
INTRAMUSCULAR | Status: DC | PRN
  Administered 2024-03-03: 100 mg via INTRAVENOUS

## 2024-03-03 MED ORDER — PROPOFOL 10 MG/ML IV BOLUS
INTRAVENOUS | Status: DC | PRN
  Administered 2024-03-03: 30 mg via INTRAVENOUS
  Administered 2024-03-03 (×2): 20 mg via INTRAVENOUS
  Administered 2024-03-03: 80 mg via INTRAVENOUS
  Administered 2024-03-03: 50 mg via INTRAVENOUS

## 2024-03-03 MED ORDER — PROPOFOL 10 MG/ML IV BOLUS
INTRAVENOUS | Status: AC
  Filled 2024-03-03: qty 20

## 2024-03-03 MED ORDER — LACTATED RINGERS IV SOLN
INTRAVENOUS | Status: AC | PRN
  Administered 2024-03-03: 1000 mL via INTRAVENOUS

## 2024-03-03 MED ORDER — PANTOPRAZOLE SODIUM 40 MG PO TBEC: 40.0000 mg | DELAYED_RELEASE_TABLET | Freq: Two times a day (BID) | ORAL | 11 refills | Status: AC

## 2024-03-03 MED ORDER — PROPOFOL 10 MG/ML IV BOLUS
INTRAVENOUS | Status: AC
Start: 2024-03-03 — End: ?
  Filled 2024-03-03: qty 20

## 2024-03-03 NOTE — Discharge Instructions (Signed)

## 2024-03-03 NOTE — Transfer of Care (Signed)
 Immediate Anesthesia Transfer of Care Note  Patient: Ryan Vaughan  Procedure(s) Performed: ENTEROSCOPY  Patient Location: PACU and Endoscopy Unit  Anesthesia Type:General  Level of Consciousness: awake, alert , oriented, and patient cooperative  Airway & Oxygen  Therapy: Patient Spontanous Breathing and Patient connected to face mask oxygen   Post-op Assessment: Report given to RN and Post -op Vital signs reviewed and stable  Post vital signs: Reviewed and stable  Last Vitals:  Vitals Value Taken Time  BP 141/69 03/03/24 0748  Temp    Pulse 82 03/03/24 0749  Resp 17 03/03/24 0749  SpO2 95 % 03/03/24 0749  Vitals shown include unfiled device data.  Last Pain:  Vitals:   03/03/24 0654  TempSrc: Temporal  PainSc: 0-No pain      Patients Stated Pain Goal: 0 (03/03/24 0654)  Complications: No notable events documented.

## 2024-03-03 NOTE — Anesthesia Postprocedure Evaluation (Signed)
 Anesthesia Post Note  Patient: Ryan Vaughan  Procedure(s) Performed: ENTEROSCOPY     Patient location during evaluation: PACU Anesthesia Type: MAC Level of consciousness: awake Pain management: pain level controlled Vital Signs Assessment: post-procedure vital signs reviewed and stable Respiratory status: spontaneous breathing, nonlabored ventilation and respiratory function stable Cardiovascular status: stable and blood pressure returned to baseline Postop Assessment: no apparent nausea or vomiting Anesthetic complications: no   No notable events documented.  Last Vitals:  Vitals:   03/03/24 0750 03/03/24 0800  BP: 131/63 114/75  Pulse: 82 70  Resp: 17 (!) 23  Temp: 36.6 C   SpO2: 95% 97%    Last Pain:  Vitals:   03/03/24 0800  TempSrc:   PainSc: 0-No pain                 Conard Decent

## 2024-03-03 NOTE — H&P (Signed)
 Subjective:    Subjective Patient ID: Ryan Vaughan, male    DOB: [birth year], 78 y.o.   MRN: [id number]   HPI Ryan Vaughan is a pleasant 78 year old white male established with Dr. Venice Gillis who comes in today for follow-up after a recent ER visit on 09/27/2023.  He had presented with complaints of fatigue and generally feeling poorly in addition had developed increased in bilateral lower extremity edema.  Labs revealed a hemoglobin of 8.4, down from hemoglobin of 11.7/hematocrit 35.4 in August 2024.  Stool was also documented heme positive on rectal exam without any overt melena or hematochezia. Patient is on Xarelto  and aspirin . He also had increase in his dose of daily Lasix  at the time of that visit. 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 Is on chronic Protonix  40 mg daily for GERD symptoms. He had undergone EGD in July 2024 with finding of moderate gastritis and a wide open nonobstructing duodenal stricture was noted.  Procedure done at that time for epigastric pain and weight loss.  He is being followed by vascular surgery and had a CTA around that same time showing chronic mesenteric stenosis. His last colonoscopy was done in August 2022 with finding of multiple diverticuli, internal hemorrhoids and he had 2 small sessile polyps removed 4 to 6 mm.  Both of these were tubular adenomas. Patient himself says that he has not been having any abdominal pain or discomfort, no nausea or vomiting, no heartburn or indigestion symptoms on Protonix  no dysphagia.  He has not noticed any change in his bowel habits or any melena or hematochezia.     Review of Systems Pertinent positive and negative review of systems were noted in the above HPI section.   All other review of systems was otherwise negative.        Outpatient Encounter Medications as of 10/07/2023  Medication Sig   acetaminophen  (TYLENOL ) 500 MG tablet Take 500-1,500 mg by mouth every 8 (eight) hours as needed for moderate pain or headache.   aspirin  EC 81 MG tablet Take 81 mg by mouth daily.   atorvastatin  (LIPITOR ) 80 MG tablet TAKE 1 TABLET BY MOUTH ONCE DAILY   carvedilol  (COREG ) 12.5 MG tablet Take 1 tablet (12.5 mg total) by mouth 2 (two) times daily.   cyanocobalamin  (VITAMIN B12) 1000 MCG tablet Take 1,000 mcg by mouth daily.   donepezil (ARICEPT) 10 MG tablet Take 10 mg by mouth at bedtime.   FLUoxetine (PROZAC) 20 MG tablet Take 20 mg by mouth daily.   gabapentin (NEURONTIN) 800 MG tablet Take 800 mg by mouth as needed for pain.   glimepiride  (AMARYL ) 4 MG tablet Take 4 mg by mouth 2 (two) times daily.   LANTUS SOLOSTAR 100 UNIT/ML Solostar Pen Inject 25 Units into the skin at bedtime.   lisinopril  (ZESTRIL ) 10 MG tablet Take 1 tablet (10 mg total) by mouth daily.   memantine (NAMENDA) 5 MG tablet Take 5 mg by mouth 2 (two) times daily.   pantoprazole  (PROTONIX ) 40 MG tablet Take 1 tablet (40 mg total) by mouth daily.   rivaroxaban  (XARELTO ) 20 MG TABS tablet Take 1 tablet (20 mg total) by mouth daily with supper.   Vitamin D, Ergocalciferol, (DRISDOL) 1.25 MG (50000 UT) CAPS capsule Take 50,000 Units by  mouth every Friday.    [DISCONTINUED] furosemide  (LASIX ) 20 MG tablet Take 1 tablet (20 mg total) by mouth daily.   nitroGLYCERIN  (NITROSTAT ) 0.4 MG SL tablet Place 1 tablet (0.4 mg total) under the tongue every 5 (five) minutes as needed for chest pain. (Patient not taking: Reported on 10/07/2023)   [DISCONTINUED] furosemide  (LASIX ) 20 MG tablet Take 1 tablet (20 mg total) by mouth daily. (Patient not taking: Reported on 10/07/2023)      No facility-administered encounter medications on file as of 10/07/2023.      Allergies       Allergies  Allergen Reactions    Penicillins Rash      Did it involve swelling of the face/tongue/throat, SOB, or low BP? No Did it involve sudden or severe rash/hives, skin peeling, or any reaction on the inside of your mouth or nose? No Did you need to seek medical attention at a hospital or doctor's office? No When did it last happen?      2-3 years If all above answers are "NO", may proceed with cephalosporin use.     Amiodarone  Hives   Isosorbide  Mononitrate Er [Isosorbide  Dinitrate] Other (See Comments)      Headache and dizziness   Losartan  Swelling          Patient Active Problem List    Diagnosis Date Noted   Depression     Emphysema of lung (HCC)     Plantar fasciitis of left foot 12/03/2022   Diabetes mellitus with neuropathy (HCC) 12/03/2022   Hammertoes of both feet 11/14/2022   Overgrown toenails 11/14/2022   Aneurysm of ascending aorta (HCC) 01/05/2021   Permanent atrial fibrillation (HCC) 12/02/2020   Atrial fibrillation (HCC)     Carotid artery disease (HCC)     Coronary artery disease     Diabetes mellitus without complication (HCC)     Hearing loss     Hypertension     TIA (transient ischemic attack)     CAD (coronary artery disease) 11/05/2019   S/P CABG x 4 09/10/2019   Diabetes mellitus due to underlying condition with unspecified complications (HCC) 08/25/2018   Gout 01/24/2018   Osteoarthritis of left knee 01/24/2018   Carotid artery stenosis 05/14/2017   H/O carotid endarterectomy 05/14/2017   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   Chronic atrial fibrillation (HCC) 05/04/2015   Dyslipidemia 05/04/2015   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   Chronic anticoagulation 04/08/2014   Essential hypertension 04/08/2014    Social History         Socioeconomic History   Marital status: Married      Spouse name: Not on file   Number of children: 2   Years of education: Not on file   Highest education level: Not on file  Occupational History   Occupation: Retired   Tobacco Use   Smoking status: Never   Smokeless tobacco: Never  Vaping Use   Vaping status: Never Used  Substance and Sexual Activity   Alcohol use: No   Drug use: No   Sexual activity: Not on file  Other Topics Concern   Not on file  Social History Narrative    Right handed     Lives with family     Social Drivers of Health        Financial Resource Strain: Low Risk  (01/22/2023)    Received from [facility], [facility]    Overall Financial Resource Strain (CARDIA)     Difficulty of Paying  Living Expenses: Not hard at all  Food Insecurity: No Food Insecurity (01/22/2023)    Received from [facility], [facility]    Hunger Vital Sign     Worried About Running Out of Food in the Last Year: Never true     Ran Out of Food in the Last Year: Never true  Transportation Needs: No Transportation Needs (01/22/2023)    Received from [facility], [facility]    [facility] - Transportation     Lack of Transportation (Medical): No     Lack of Transportation (Non-Medical): No  Physical Activity: Not on file  Stress: Not on file  Social Connections: Unknown (01/16/2023)    Received from [facility], [facility]    Social Network     Social Network: Not on file  Intimate Partner Violence: Unknown (01/16/2023)    Received from [facility], [facility]    HITS     Physically Hurt: Not on file     Insult or Talk Down To: Not on file     Threaten Physical Harm: Not on file     Scream or Curse: Not on file      Mr. Bruun family history includes Hypertension in his mother.         Objective:    Objective[] Expand by Default    Vitals:    10/07/23 0949  BP: 130/70  Pulse: 74      Physical Exam. Well-developed well-nourished elderly WM  in no acute distress.  Accompanied by his wife height, Weight 318, BMI 38.7   HEENT; nontraumatic normocephalic, EOMI, PE R LA, sclera anicteric. Oropharynx; not examined today Neck; supple, no JVD Cardiovascular; regular  rate and rhythm with S1-S2, no murmur rub or gallop Pulmonary; Clear bilaterally Abdomen; soft, obese,, nondistended, no palpable mass or hepatosplenomegaly, bowel sounds are active Rectal; not done today Skin; benign exam, no jaundice rash or appreciable lesions Extremities; no clubbing cyanosis or edema skin warm and dry Neuro/Psych; alert and oriented x4, grossly nonfocal mood and affect appropriate            Assessment & Plan:    #49 78 year old white male with new anemia, and heme positive stool without any overt melena or hematochezia.  This was found in setting of chronic aspirin  and Xarelto . Suspect chronic slow GI blood loss over several months, with hemoglobin 11.7 documented in August 2024. Etiology of slow GI blood loss is not clear, he just had EGD in July 2024 as above with only significant finding of moderate gastritis, and is up-to-date with colonoscopy last done August 2022 with 2 small polyps removed tubular adenomas, and noting diverticulosis and internal hemorrhoids. Will need to rule out small bowel source for anemia i.e. AVMs or other mucosal abnormality.   #2 chronic GERD-stable on Protonix  3.  History of adenomatous colon polyps-up-to-date with colonoscopy last done August 2022   #4 permanent atrial fibrillation-on Xarelto  5.  Coronary artery disease status post CABG x 4 6.  Diabetes mellitus 7.  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  History of TIA 9.  Peripheral vascular disease status post carotid endarterectomy 10.  Ascending aortic aneurysm   Plan; repeat CBC today, check iron studies Continue Protonix  40 mg p.o. every morning AC breakfast Patient will be scheduled for capsule endoscopy.  I discussed indications risks and benefits in detail with the patient, and he is agreeable to proceed. If capsule endoscopy is unrevealing then may need to consider repeat colonoscopy with Dr. Venice Gillis. For now he  continues on baby aspirin  and Xarelto  daily   Amy Starleen Eastern  PA-C   Attending physician's note   I have taken history, reviewed the chart and examined the patient. I performed a substantive portion of this encounter, including complete performance of at least one of the key components, in conjunction with the APP. I agree with the Advanced Practitioner's note, impression and recommendations.   VCE: SB AVM with intermittent oozing  For SBE today off Xeralto Check CBC, CMP    RG   Magnus Schuller, MD [facility] [phone]

## 2024-03-03 NOTE — Op Note (Signed)
 [facility] Patient Name: Ryan Vaughan Procedure Date: 03/03/2024 MRN: [id number] Attending MD: Lajuan Pila , MD, [id number] Date of Birth: 1946-05-18 CSN: [id number] Age: 78 Admit Type: Outpatient Procedure:                Small bowel enteroscopy Indications:              IDA with H+ stools, prev neg EGD July 2024/ neg                            colon Aug 2022. VCE with ? SB AVMs. Providers:                Lajuan Pila, MD, Jacquelyn "Jaci" Bernetta Brilliant, RN,                            Adolfo Hooker, Technician Referring MD:             Dr. Ileana Mallard Medicines:                Monitored Anesthesia Care Complications:            No immediate complications. Estimated Blood Loss:     Estimated blood loss: none. Procedure:                Pre-Anesthesia Assessment:                           - Prior to the procedure, a History and Physical                            was performed, and patient medications and                            allergies were reviewed. The patient's tolerance of                            previous anesthesia was also reviewed. The risks                            and benefits of the procedure and the sedation                            options and risks were discussed with the patient.                            All questions were answered, and informed consent                            was obtained. Prior Anticoagulants: The patient has                            taken Xarelto  (rivaroxaban ), last dose was 2 days                            prior to procedure. ASA Grade Assessment: III - A  patient with severe systemic disease. After                            reviewing the risks and benefits, the patient was                            deemed in satisfactory condition to undergo the                            procedure.                           After obtaining informed consent, the endoscope was                            passed  under direct vision. Throughout the                            procedure, the patient's blood pressure, pulse, and                            oxygen  saturations were monitored continuously. The                            peds ultrathin coloscope was introduced through the                            mouth and advanced to the proximal jejunum. The                            small bowel enteroscopy was accomplished without                            difficulty. The patient tolerated the procedure                            well.                           Glucagon was given on withdrawal of the scope. Scope In: Scope Out: Findings:      The examined esophagus was normal with well-defined Z-line.      Localized mild inflammation characterized by congestion (edema) and       erosions was found in the gastric antrum. Biopsies were taken with a       cold forceps for histology.      There was no evidence of significant pathology in the entire examined       duodenum.      There was no evidence of significant pathology in the entire examined       portion of jejunum. Impression:               - Mild erosive gastritis.                           - No active bleeding. Recommendation:           -  Discharge patient to home.                           - Use Protonix  (pantoprazole ) 40 mg PO BID for 4                            weeks, then 40mg  QD.                           - Await pathology results.                           - Resume Xeralto 6/4                           - Check CBC, CMP today                           - If continued IDA requiring blood transfusion,                            then consider switching Xarelto  to Eliquis                           - Avoid nonsteroidals.                           - Follow-up GI clinic as needed.                           - The findings and recommendations were discussed                            with the patient's family. Procedure Code(s):        ---  Professional ---                           [phone], Small intestinal endoscopy, enteroscopy                            beyond second portion of duodenum, not including                            ileum; with biopsy, single or multiple Diagnosis Code(s):        --- Professional ---                           K29.70, Gastritis, unspecified, without bleeding                           K92.2, Gastrointestinal hemorrhage, unspecified CPT copyright 2022 American Medical Association. All rights reserved. The codes documented in this report are preliminary and upon coder review may  be revised to meet current compliance requirements. Lajuan Pila, MD 03/03/2024 7:55:44 AM This report has been signed electronically. Number of Addenda: 0

## 2024-03-03 NOTE — Anesthesia Procedure Notes (Signed)
 Procedure Name: General with mask airway Date/Time: 03/03/2024 7:20 AM  Performed by: Carolynn Citrin, CRNAPre-anesthesia Checklist: Patient identified, Emergency Drugs available, Suction available, Patient being monitored and Timeout performed Patient Re-evaluated:Patient Re-evaluated prior to induction Oxygen  Delivery Method: Simple face mask Preoxygenation: Pre-oxygenation with 100% oxygen  Induction Type: IV induction Placement Confirmation: positive ETCO2 Dental Injury: Teeth and Oropharynx as per pre-operative assessment  Comments: POM mask used

## 2024-03-05 ENCOUNTER — Encounter (HOSPITAL_COMMUNITY): Payer: Self-pay | Admitting: Gastroenterology

## 2024-03-05 LAB — SURGICAL PATHOLOGY

## 2024-03-28 ENCOUNTER — Encounter (HOSPITAL_COMMUNITY): Payer: Self-pay | Admitting: Interventional Radiology

## 2024-04-08 DIAGNOSIS — L820 Inflamed seborrheic keratosis: Secondary | ICD-10-CM | POA: Diagnosis not present

## 2024-04-15 ENCOUNTER — Ambulatory Visit (INDEPENDENT_AMBULATORY_CARE_PROVIDER_SITE_OTHER): Admitting: Physician Assistant

## 2024-04-15 ENCOUNTER — Encounter: Payer: Self-pay | Admitting: Physician Assistant

## 2024-04-15 VITALS — BP 149/70 | HR 75 | Resp 18 | Ht 76.0 in | Wt 311.0 lb

## 2024-04-15 DIAGNOSIS — E1165 Type 2 diabetes mellitus with hyperglycemia: Secondary | ICD-10-CM | POA: Diagnosis not present

## 2024-04-15 DIAGNOSIS — M109 Gout, unspecified: Secondary | ICD-10-CM | POA: Diagnosis not present

## 2024-04-15 DIAGNOSIS — D62 Acute posthemorrhagic anemia: Secondary | ICD-10-CM | POA: Diagnosis not present

## 2024-04-15 DIAGNOSIS — E1129 Type 2 diabetes mellitus with other diabetic kidney complication: Secondary | ICD-10-CM | POA: Diagnosis not present

## 2024-04-15 DIAGNOSIS — E785 Hyperlipidemia, unspecified: Secondary | ICD-10-CM | POA: Diagnosis not present

## 2024-04-15 DIAGNOSIS — D531 Other megaloblastic anemias, not elsewhere classified: Secondary | ICD-10-CM | POA: Diagnosis not present

## 2024-04-15 DIAGNOSIS — I2510 Atherosclerotic heart disease of native coronary artery without angina pectoris: Secondary | ICD-10-CM | POA: Diagnosis not present

## 2024-04-15 DIAGNOSIS — F0670 Mild neurocognitive disorder due to known physiological condition without behavioral disturbance: Secondary | ICD-10-CM

## 2024-04-15 DIAGNOSIS — F331 Major depressive disorder, recurrent, moderate: Secondary | ICD-10-CM | POA: Diagnosis not present

## 2024-04-15 DIAGNOSIS — I10 Essential (primary) hypertension: Secondary | ICD-10-CM | POA: Diagnosis not present

## 2024-04-15 DIAGNOSIS — G300 Alzheimer's disease with early onset: Secondary | ICD-10-CM | POA: Diagnosis not present

## 2024-04-15 DIAGNOSIS — I4891 Unspecified atrial fibrillation: Secondary | ICD-10-CM | POA: Diagnosis not present

## 2024-04-15 DIAGNOSIS — F0280 Dementia in other diseases classified elsewhere without behavioral disturbance: Secondary | ICD-10-CM | POA: Diagnosis not present

## 2024-04-15 NOTE — Progress Notes (Signed)
 Assessment/Plan:    Mild cognitive impairment of unclear etiology, concern for vascular and Alzheimer's disease  Ryan Vaughan is a very pleasant 78 y.o. RH male with a history of hypertension, hyperlipidemia, anemia, atrial fibrillation on chronic Xarelto , history of CVA in 2015, CAD, RAS, vitamin D deficiency, DM2, bilateral carotid artery stenosis, RP1 and LP2 stenosis per arteriogram by IR,  GERD and a diagnosis of mild cognitive impairment per neuropsych evaluation  presenting today in follow-up for evaluation of memory loss. Patient is on donepezil 10 mg daily and memantine 10 mg twice daily per PCP.  Mild cognitive decline is noted today with MMSE 19/30.  He is still able to participate in his ADLs and to drive without difficulties.  He is trying to find other activities to do to avoid routine and boredom.  Memory is stable, mood controlled with Prozac.     Recommendations:   Follow up in  6 months. Continue donepezil 10 mg daily and memantine 10 mg twice daily as per PCP, side effects discussed Recommend good control of cardiovascular risk factors he is on aspirin  and Xarelto  Continue to control mood as per PCP  recommend increasing socialization and physical activities Recommend for his wife to seek caregiver support group as caregiver distress is suspected    Subjective:   This patient is accompanied in the office by his wife  who supplements the history. Previous records as well as any outside records available were reviewed prior to todays visit.   Patient was last seen on 12/03/2023 .    Any changes in memory since last visit? It is worse-wife says.  Some good and bad days  continues to have issues with short-term memory, especially with recent conversations and names of people he is long-term memory is good repeats oneself?  Endorsed, a lot  Disoriented when walking into a room?  Patient denies   Misplacing objects?Endorsed especially with the keys but not in  unusual places  Wandering behavior?   Denies. Any personality changes since last visit?  He has moments of irritability as before Any worsening depression?:  He is not interested in doing a thing patient does admit that he feels bored at times and he is trying to find activities to do.  Wife gets very frustrated about this issue and during these visits they began to argue about it. Hallucinations or paranoia?  Denies.   Seizures?   Denies.    Any sleep changes?  Sleeps well.  Denies vivid dreams, REM behavior or sleepwalking   Sleep apnea?   denies    Any hygiene concerns?   Denies.   Independent of bathing and dressing?  Endorsed  Does the patient needs help with medications?  Wife is in charge   Who is in charge of the finances?  Wife is in charge     Any changes in appetite?  denies     Patient have trouble swallowing?  Denies.   Does the patient cook?  Any kitchen accidents such as leaving the stove on?   Denies.   Any headaches?    Denies.   Vision changes? Denies. Chronic pain?  Denies.   Ambulates with difficulty?    Denies.    Recent falls or head injuries?    Denies.      Unilateral weakness, numbness or tingling?  Denies.   Any tremors?  Denies.   Any anosmia?    Denies.   Any incontinence of urine?  Denies.   Any bowel  dysfunction?  Denies.      Patient lives with his wife.  Does the patient drive?  Yes, he denies getting lost    Initial visit May 2024 How long did patient have memory difficulties? For about 1 year, worse over the last 6 months, especially wit STM.  Family noticed memory changes since Christmas of 2023.  He reports that he forgets new information.  He initially attributed  the symptoms to some depression but for the last 3 months, he has significant difficulty with numbers.  He denies any issues with LTM . repeats oneself?  Endorsed every 5 minutes he asks the same question and tells the same story-wife reports.  (He did not repeat himself during this  visit) Disoriented when walking into a room?  Patient denies except occasionally not remembering what patient came to the room for   Leaving objects in unusual places?   denies other than occasionally misplacing his glasses.  Wandering behavior? denies   Any personality changes ? denies   Any history of depression?:  Patient denies but daughter reports that he may be experiencing situational depression, with less desire of doing activities outside, having a feeling of worthlessness, decreased appetite.  His daughter states that my parents do not believe in mental illness but some of his friends have noticed that he is depressed. Hallucinations or paranoia?  denies   Seizures? denies    Any sleep changes?  Sleeps well too much - daughter says.  Denies vivid dreams, REM behavior or sleepwalking   Sleep apnea? denies   Any hygiene concerns?  denies   Independent of bathing and dressing?  Endorsed  Does the patient need help with medications? Wife is in charge for the last 2 months as he was not taking Xarelto .    Who is in charge of the finances? Patient  is in charge     Any changes in appetite? Decreased. Sees GI because he has early satiation, for endoscopy in 04/2023.   Patient have trouble swallowing?  denies   Does the patient cook? No  Any kitchen accidents such as leaving the stove on? Patient denies   Any headaches?  denies   Chronic back pain?  Knee and back chronic pain due to arthritis.  I need surgery  Ambulates with difficulty? denies   Recent falls or head injuries? denies     Vision changes? Unilateral weakness, numbness or tingling?  denies   Any tremors?  denies   Any anosmia?  denies   Any incontinence of urine? denies   Any bowel dysfunction? denies      Patient lives with wife  History of heavy alcohol intake? denies   History of heavy tobacco use? denies   Family history of dementia?  Denies  Does patient drive? Yes. Daughter says sometimes he has to think a  little more about the directions, but she not worried about him getting lost.  Retired Education administrator.  MRI of the brain personally reviewed remarkable for progressed ischemia in the L PCA territory since a November MRI, with a moderate sized late subacute to early chronic cortical infarct .  Associated hemosiderin but no malignant hemorrhagic transformation or associated mass effect was noted.  Also seen, moderately advanced small vessel disease without changes from prior film.     Bilateral common carotid arteriograms, right vertebral arteriogram and left subclavian arteriogram by IR 05/17/2023 showing severe stenosis of the right posterior cerebral artery P1 segment and to a lesser degree of the left  posterior cerebral artery, moderate stenosis of the right middle cerebral M1 segment, nonopacification of the left vertebral artery proximally.  IR and vascular surgery are recommending a repeat angiogram 6 months after this one, to further evaluate the R P1 L P2 stenosis   MRA of the head and neck read by radiology 01/30/2024 with stable intracranial MRA compared to prior CTA from 04/16/2023, with stable MRA of the neck as compared to prior CTA from 04/16/2023. 2. Atheromatous change about the left carotid bulb with no more than mild 25% stenosis by NASCET criteria. 3. Wide patency of the right carotid artery system within the neck. 4. Moderate atheromatous stenoses at the origins of the vertebral arteries bilaterally. Vertebral arteries otherwise widely patent.     Neuropsych evaluation 11/2023 Briefly, results suggested severe impairment surrounding all aspects of learning and memory. Additional impairments were exhibited across complex attention, executive functioning, and verbal fluency (phonemic worse than semantic), while performance was variable across processing speed and visuospatial abilities. The cause for ongoing memory impairment is unclear at the present time. Prior neuroimaging has suggested  moderately advanced microvascular ischemic disease, a tiny left posterior frontal vertex stroke, and another more recent stroke affecting left PCA circulation. Specific to the latter, depending on the size and extension into temporal lobe structures, left PCA strokes can create some verbal memory dysfunction assuming normal brain lateralization in this right-handed individual. The combination of these factors creating ongoing dysfunction is at least a plausible conceptualization. Despite this, I do have concern surrounding an underlying neurodegenerative illness, namely Alzheimer's disease. Across verbal and visual memory testing, Mr. Wogan was basically amnestic (i.e., 0% retention across list and figure tasks, 17% across a story task) after a brief delay and performed very poorly across yes/no recognition trials. This suggests evidence for rapid forgetting and a prominent storage impairment, both of which represent the hallmark testing characteristics of this illness. Intact confrontation naming is encouraging and could suggest this illness being in early stages if it is indeed present. While his left PCA infarct could account for some verbal memory impairment, it would not explain complete amnesia surrounding visual memory. Overall, a mixed etiology may fit the testing data best and ongoing medical monitoring will be very important moving forward to better determine the risk for underlying Alzheimer's disease.   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   Coronary artery disease    Diabetic neuropathy 12/03/2022   Dyslipidemia 05/04/2015   Emphysema of lung    Essential hypertension  04/08/2014   Gout 01/24/2018   Hammertoes of both feet 11/14/2022   Hearing loss    bilateral - no hearing aids   History of carotid endarterectomy 05/14/2017   Lacunar infarction    b/l dorsal thalami    Left PCA stroke 03/2023   Major depressive disorder    Mild neurocognitive disorder due to multiple etiologies 11/12/2023   Osteoarthritis of left knee 01/24/2018   Overgrown toenails 11/14/2022   Plantar fasciitis of left foot 12/03/2022   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 Ascending colon polyp status post polypectomy.   CORONARY ARTERY BYPASS GRAFT N/A 09/10/2019   Procedure: CORONARY ARTERY BYPASS GRAFTING (CABG) x4 , using left internal mammary artery, and right leg greater saphenous vein harvested endoscopically;  Surgeon: Lucas Dorise POUR, MD;  Location: [facility] OR;  Service: Open Heart Surgery;  Laterality: N/A;   ENTEROSCOPY N/A 03/03/2024   Procedure: ENTEROSCOPY;  Surgeon: Charlanne Groom, MD;  Location: [facility] ENDOSCOPY;  Service: Gastroenterology;  Laterality: N/A;  Push Enteroscopy   IR ANGIO INTRA EXTRACRAN SEL COM CAROTID INNOMINATE BILAT MOD SED  05/17/2023   IR ANGIO VERTEBRAL SEL VERTEBRAL  UNI R MOD SED  05/17/2023   IR RADIOLOGIST EVAL & MGMT  05/13/2023   IR US  GUIDE VASC ACCESS RIGHT  05/17/2023   LEFT HEART CATH AND CORONARY ANGIOGRAPHY N/A 08/28/2019   Procedure: LEFT HEART CATH AND CORONARY ANGIOGRAPHY;  Surgeon: Claudene Victory ORN, MD;  Location: [facility] INVASIVE CV LAB;  Service: Cardiovascular;  Laterality: N/A;   SIGMOIDOSCOPY  11/17/2012   Minimal residual polyp, status post polypectomy. Small internal hemorrhoids.   TEE WITHOUT CARDIOVERSION N/A 09/10/2019   Procedure: TRANSESOPHAGEAL ECHOCARDIOGRAM (TEE);  Surgeon: Lucas Dorise POUR, MD;  Location: [facility] OR;  Service: Open Heart Surgery;  Laterality: N/A;     PREVIOUS MEDICATIONS:   CURRENT MEDICATIONS:  Outpatient Encounter Medications as of 04/15/2024  Medication Sig   acetaminophen  (TYLENOL ) 500 MG tablet Take 500-1,500 mg by mouth every 8 (eight) hours as needed for moderate pain or headache.   aspirin  EC 81 MG tablet Take 81 mg by mouth daily.   atorvastatin  (LIPITOR ) 80 MG tablet Take 1 tablet (80 mg total) by mouth daily.   carvedilol  (COREG ) 12.5 MG tablet Take 1 tablet (12.5 mg total) by mouth 2 (two) times daily.   cyanocobalamin  (VITAMIN B12) 1000 MCG tablet Take 1,000 mcg by mouth daily.   donepezil (ARICEPT) 10 MG tablet Take 10 mg by mouth at bedtime.   escitalopram (LEXAPRO) 20 MG tablet Take 20 mg by mouth daily.   FLUoxetine (PROZAC) 20 MG capsule Take 20 mg by mouth daily.   furosemide  (LASIX ) 20 MG tablet Take 1 tablet (20 mg total) by mouth daily.   gabapentin (NEURONTIN) 800 MG tablet Take 800 mg by mouth as needed for pain.   glimepiride  (AMARYL ) 4 MG tablet Take 4 mg by mouth 2 (two) times daily.   LANTUS SOLOSTAR 100 UNIT/ML Solostar Pen Inject 25 Units into the skin at bedtime.   lisinopril  (ZESTRIL ) 10 MG tablet Take 1 tablet (10 mg total) by mouth daily.   memantine (NAMENDA) 5 MG tablet Take 5 mg by mouth 2 (two) times daily.   nitroGLYCERIN  (NITROSTAT ) 0.4 MG SL tablet Place 1 tablet (0.4 mg total)  under the tongue every 5 (five) minutes as needed for  chest pain.   pantoprazole  (PROTONIX ) 40 MG tablet Take 1 tablet (40 mg total) by mouth 2 (two) times daily.   rivaroxaban  (XARELTO ) 20 MG TABS tablet Take 1 tablet (20 mg total) by mouth daily with supper.   Vitamin D, Ergocalciferol, (DRISDOL) 1.25 MG (50000 UT) CAPS capsule Take 50,000 Units by mouth every Friday.    No facility-administered encounter medications on file as of 04/15/2024.     Objective:     PHYSICAL EXAMINATION:    VITALS:   Vitals:   04/15/24 1110  BP: (!) 149/70  Pulse: 75  Resp: 18  SpO2: 97%  Weight: (!) 311 lb (141.1 kg)  Height: 6' 4 (1.93 m)    GEN:  The patient appears stated age and is in NAD. HEENT:  Normocephalic, atraumatic.   Neurological examination:  General: NAD, well-groomed, appears stated age. Orientation: The patient is alert. Oriented to person, not to place or date  cranial nerves: There is good facial symmetry.The speech is fluent and clear. No aphasia or dysarthria. Fund of knowledge is appropriate. Recent and remote memory intact.  Attention and concentration are reduced.  Able to name objects and repeat phrases.  Hearing is mildly decreased to conversational tone .   Delayed recall 0/3 Sensation: Sensation is intact to light touch throughout Motor: Strength is at least antigravity x4. DTR's 2/4 in UE/LE      02/15/2023    9:00 AM  Montreal Cognitive Assessment   Visuospatial/ Executive (0/5) 3  Naming (0/3) 2  Attention: Read list of digits (0/2) 2  Attention: Read list of letters (0/1) 1  Attention: Serial 7 subtraction starting at 100 (0/3) 3  Language: Repeat phrase (0/2) 0  Language : Fluency (0/1) 0  Abstraction (0/2) 2  Delayed Recall (0/5) 0  Orientation (0/6) 2  Total 15  Adjusted Score (based on education) 16       04/15/2024    5:00 PM  MMSE - Mini Mental State Exam  Orientation to time 2  Orientation to Place 3  Registration 3  Attention/  Calculation 4  Recall 0  Language- name 2 objects 2  Language- repeat 1  Language- follow 3 step command 3  Language- read & follow direction 1  Write a sentence 0  Copy design 0  Total score 19       Movement examination: Tone: There is normal tone in the UE/LE Abnormal movements:  no tremor.  No myoclonus.  No asterixis.   Coordination:  There is no decremation with RAM's. Normal finger to nose  Gait and Station: The patient has no difficulty arising out of a deep-seated chair without the use of the hands. The patient's stride length is good.  Gait is cautious and narrow.   Thank you for allowing us  the opportunity to participate in the care of this nice patient. Please do not hesitate to contact us  for any questions or concerns.   Total time spent on today's visit was 37 minutes dedicated to this patient today, preparing to see patient, examining the patient, ordering tests and/or medications and counseling the patient, documenting clinical information in the EHR or other health record, independently interpreting results and communicating results to the patient/family, discussing treatment and goals, answering patient's questions and coordinating care.  Cc:  Keren Vicenta BRAVO, MD  Camie Sevin 04/15/2024 5:40 PM

## 2024-04-15 NOTE — Patient Instructions (Addendum)
 It was a pleasure to see you today at our office.   Recommendations:   Continue donepezil and memantine   Follow up  Feb 5 at  at 11;30       RECOMMENDATIONS FOR ALL PATIENTS WITH MEMORY PROBLEMS: 1. Continue to exercise (Recommend 30 minutes of walking everyday, or 3 hours every week) 2. Increase social interactions - continue going to [place] and enjoy social gatherings with friends and family 3. Eat healthy, avoid fried foods and eat more fruits and vegetables 4. Maintain adequate blood pressure, blood sugar, and blood cholesterol level. Reducing the risk of stroke and cardiovascular disease also helps promoting better memory. 5. Avoid stressful situations. Live a simple life and avoid aggravations. Organize your time and prepare for the next day in anticipation. 6. Sleep well, avoid any interruptions of sleep and avoid any distractions in the bedroom that may interfere with adequate sleep quality 7. Avoid sugar, avoid sweets as there is a strong link between excessive sugar intake, diabetes, and cognitive impairment We discussed the Mediterranean diet, which has been shown to help patients reduce the risk of progressive memory disorders and reduces cardiovascular risk. This includes eating fish, eat fruits and green leafy vegetables, nuts like almonds and hazelnuts, walnuts, and also use olive oil. Avoid fast foods and fried foods as much as possible. Avoid sweets and sugar as sugar use has been linked to worsening of memory function.  There is always a concern of gradual progression of memory problems. If this is the case, then we may need to adjust level of care according to patient needs. Support, both to the patient and caregiver, should then be put into place.       FALL PRECAUTIONS: Be cautious when walking. Scan the area for obstacles that may increase the risk of trips and falls. When getting up in the mornings, sit up at the edge of the bed for a few minutes before getting out  of bed. Consider elevating the bed at the head end to avoid drop of blood pressure when getting up. Walk always in a well-lit room (use night lights in the walls). Avoid area rugs or power cords from appliances in the middle of the walkways. Use a walker or a cane if necessary and consider physical therapy for balance exercise. Get your eyesight checked regularly.  FINANCIAL OVERSIGHT: Supervision, especially oversight when making financial decisions or transactions is also recommended.  HOME SAFETY: Consider the safety of the kitchen when operating appliances like stoves, microwave oven, and blender. Consider having supervision and share cooking responsibilities until no longer able to participate in those. Accidents with firearms and other hazards in the house should be identified and addressed as well.   ABILITY TO BE LEFT ALONE: If patient is unable to contact 911 operator, consider using LifeLine, or when the need is there, arrange for someone to stay with patients. Smoking is a fire hazard, consider supervision or cessation. Risk of wandering should be assessed by caregiver and if detected at any point, supervision and safe proof recommendations should be instituted.  MEDICATION SUPERVISION: Inability to self-administer medication needs to be constantly addressed. Implement a mechanism to ensure safe administration of the medications.   DRIVING: Regarding driving, in patients with progressive memory problems, driving will be impaired. We advise to have someone else do the driving if trouble finding directions or if minor accidents are reported. Independent driving assessment is available to determine safety of driving.   If you are interested in the  driving assessment, you can contact the following:  [employer] in [place] [phone]  [facility] [phone]  [facility] [phone] or  [phone]    Mediterranean Diet A Mediterranean diet refers to food and lifestyle choices that are based on the traditions of countries located on the [employer]. This way of eating has been shown to help prevent certain conditions and improve outcomes for people who have chronic diseases, like kidney disease and heart disease. What are tips for following this plan? Lifestyle  Cook and eat meals together with your family, when possible. Drink enough fluid to keep your urine clear or pale yellow. Be physically active every day. This includes: Aerobic exercise like running or swimming. Leisure activities like gardening, walking, or housework. Get 7-8 hours of sleep each night. If recommended by your health care provider, drink red wine in moderation. This means 1 glass a day for nonpregnant women and 2 glasses a day for men. A glass of wine equals 5 oz (150 mL). Reading food labels  Check the serving size of packaged foods. For foods such as rice and pasta, the serving size refers to the amount of cooked product, not dry. Check the total fat in packaged foods. Avoid foods that have saturated fat or trans fats. Check the ingredients list for added sugars, such as corn syrup. Shopping  At the grocery store, buy most of your food from the areas near the walls of the store. This includes: Fresh fruits and vegetables (produce). Grains, beans, nuts, and seeds. Some of these may be available in unpackaged forms or large amounts (in bulk). Fresh seafood. Poultry and eggs. Low-fat dairy products. Buy whole ingredients instead of prepackaged foods. Buy fresh fruits and vegetables in-season from local farmers markets. Buy frozen fruits and vegetables in resealable bags. If you do not have access to quality fresh seafood, buy precooked frozen shrimp or canned fish, such as tuna, salmon, or sardines. Buy small amounts of raw or cooked vegetables, salads, or olives from the deli or salad bar  at your store. Stock your pantry so you always have certain foods on hand, such as olive oil, canned tuna, canned tomatoes, rice, pasta, and beans. Cooking  Cook foods with extra-virgin olive oil instead of using butter or other vegetable oils. Have meat as a side dish, and have vegetables or grains as your main dish. This means having meat in small portions or adding small amounts of meat to foods like pasta or stew. Use beans or vegetables instead of meat in common dishes like chili or lasagna. Experiment with different cooking methods. Try roasting or broiling vegetables instead of steaming or sauteing them. Add frozen vegetables to soups, stews, pasta, or rice. Add nuts or seeds for added healthy fat at each meal. You can add these to yogurt, salads, or vegetable dishes. Marinate fish or vegetables using olive oil, lemon juice, garlic, and fresh herbs. Meal planning  Plan to eat 1 vegetarian meal one day each week. Try to work up to 2 vegetarian meals, if possible. Eat seafood 2 or more times a week. Have healthy snacks readily available, such as: Vegetable sticks with hummus. Greek yogurt. Fruit and nut trail mix. Eat balanced meals throughout the week. This includes: Fruit: 2-3 servings a day Vegetables: 4-5 servings a day Low-fat dairy: 2 servings a day Fish, poultry, or lean meat: 1 serving a day Beans and legumes: 2 or more servings a week Nuts and seeds:  1-2 servings a day Whole grains: 6-8 servings a day Extra-virgin olive oil: 3-4 servings a day Limit red meat and sweets to only a few servings a month What are my food choices? Mediterranean diet Recommended Grains: Whole-grain pasta. Brown rice. Bulgar wheat. Polenta. Couscous. Whole-wheat bread. [place] Madeira. Vegetables: Artichokes. Beets. Broccoli. Cabbage. Carrots. Eggplant. Green beans. Chard. Kale. Spinach. Onions. Leeks. Peas. Squash. Tomatoes. Peppers. Radishes. Fruits: Apples. Apricots. Avocado. Berries.  Bananas. Cherries. Dates. Figs. Grapes. Lemons. Melon. Oranges. Peaches. Plums. Pomegranate. Meats and other protein foods: Beans. Almonds. Sunflower seeds. Pine nuts. Peanuts. Cod. Salmon. Scallops. Shrimp. Tuna. Tilapia. Clams. Oysters. Eggs. Dairy: Low-fat milk. Cheese. Greek yogurt. Beverages: Water. Red wine. Herbal tea. Fats and oils: Extra virgin olive oil. Avocado oil. Grape seed oil. Sweets and desserts: [place] yogurt with honey. Baked apples. Poached pears. Trail mix. Seasoning and other foods: Basil. Cilantro. Coriander. Cumin. Mint. Parsley. Sage. Rosemary. Tarragon. Garlic. Oregano. Thyme. Pepper. Balsalmic vinegar. Tahini. Hummus. Tomato sauce. Olives. Mushrooms. Limit these Grains: Prepackaged pasta or rice dishes. Prepackaged cereal with added sugar. Vegetables: Deep fried potatoes (french fries). Fruits: Fruit canned in syrup. Meats and other protein foods: Beef. Pork. Lamb. Poultry with skin. Hot dogs. [place]. Dairy: Ice cream. Sour cream. Whole milk. Beverages: Juice. Sugar-sweetened soft drinks. Beer. Liquor and spirits. Fats and oils: Butter. Canola oil. Vegetable oil. Beef fat (tallow). Lard. Sweets and desserts: Cookies. Cakes. Pies. Candy. Seasoning and other foods: Mayonnaise. Premade sauces and marinades. The items listed may not be a complete list. Talk with your dietitian about what dietary choices are right for you. Summary The Mediterranean diet includes both food and lifestyle choices. Eat a variety of fresh fruits and vegetables, beans, nuts, seeds, and whole grains. Limit the amount of red meat and sweets that you eat. Talk with your health care provider about whether it is safe for you to drink red wine in moderation. This means 1 glass a day for nonpregnant women and 2 glasses a day for men. A glass of wine equals 5 oz (150 mL). This information is not intended to replace advice given to you by your health care provider. Make sure you discuss any questions you  have with your health care provider. Document Released: 05/10/2016 Document Revised: 06/12/2016 Document Reviewed: 05/10/2016 Elsevier Interactive Patient Education  2017 [employer].

## 2024-05-04 ENCOUNTER — Encounter: Payer: Self-pay | Admitting: Physician Assistant

## 2024-05-04 NOTE — Progress Notes (Unsigned)
 Cardiology Office Note:  .   Date:  05/05/2024  ID:  Ryan Vaughan, DOB November 19, 1945, MRN [id number] PCP: Keren Vicenta BRAVO, MD  [facility] Providers Cardiologist:  Radford Pease JONELLE Crape, MD    History of Present Illness: .   Ryan Vaughan is a 78 y.o. male with a past medical history of chronic atrial fibrillation, hypertension, carotid artery stenosis s/p carotid endarterectomy--follows with [facility] in [facility], renal artery aneurysm, CAD s/p CABG x 4, history of TIA, aneurysm of ascending aorta, emphysema, DM 2, dyslipidemia.  12/16/2023 echo EF 50-55%, moderate LVH, moderately elevated PASP, biatrial enlargement, mild MR, mild > mod TR, mild aortic regurgitation, aneurysm of ascending aorta 43 mm 01/23/2023 Lexiscan  normal, low risk study, moderately reduced EF at 43% 08/14/2022 CT of the chest ascending aortic dilatation at 42 mm 12/21/2020 echo EF 55 to 60%, mild MR, mild dilatation ascending aorta 41 mm 09/20/2019 CABG x 4 LIMA to LAD, SVG to diagonal, sequential SVG to distal RCA and PL, clipping of atrial appendage 08/28/2019 left heart cath mid LAD 85% stenotic, circumflex 60 to 70% narrowing, mid to distal RCA 90 to 95% stenosis >> CVTS consultation  Ryan Vaughan is a [facility]standing patient of [facility], initially established for management of hypertension.  In 2020 he underwent a left heart cath revealing triple-vessel coronary artery disease >> CABG x 4 in December 2020.  Echo in 2022 revealed mild dilatation of his ascending aorta at 41 mm.  2024 he had a Lexiscan  which was a normal, low risk study.    He was evaluated in the emergency department on 09/27/2023 for pedal edema and hemoglobin of 8.4.  He was given Lasix  and advised follow-up with cardiology.  Evaluated 10/04/2023 following hospitalization, up, plans to see GI for evaluation of his anemia, he had not noticed any overt bleeding.  Evaluated by GI on 10/07/2023--plans for an upcoming capsule endoscopy.  Evaluated 10/30/2022  for concerns of shortness of breath and labile blood pressure readings.  He was follow-up with GI closely with plans for capsule endoscopy.  We repeated an echocardiogram and he was advised to follow-up in 3 months.    He presents today accompanied by his wife, overall he is feeling better.  He has began to work outside again on his property.  He is concerned about labile blood pressure readings and he keeps a blood pressure log which we review and overall his blood pressure appears to be well-controlled, he does have some elevated readings sporadically.  He has recently had his dose of Namenda increased.  His wife is noticed that he has had a cough for the last 2 months, not productive but it has been bothersome for him.  Recent lab work by his PCP reviewed revealing: 03/30/2024 creatinine 1.29, GFR 57, normal LFTs, potassium 3.7, hemoglobin 12.2 hematocrit 37.7--creatinine clearance 97. He denies chest pain, palpitations, dyspnea, pnd, orthopnea, n, v, dizziness, syncope, edema, weight gain, or early satiety.   ROS: Review of Systems  Constitutional:  Positive for malaise/fatigue.  Respiratory:  Positive for cough.   Psychiatric/Behavioral:  Positive for memory loss.   All other systems reviewed and are negative.    Studies Reviewed: .        Cardiac Studies & Procedures   ______________________________________________________________________________________________ CARDIAC CATHETERIZATION  CARDIAC CATHETERIZATION 08/28/2019  Conclusion  Widely patent left main coronary artery  Mid LAD contains eccentric 85% stenosis after the first diagonal.  The first diagonal is small and contains ostial 90% stenosis.  A  large ramus intermedius arises from the distal left main but is free of significant obstruction.  Circumflex gives origin to a small first obtuse marginal, in the mid vessel there is eccentric 60 to 70% narrowing.  2 smaller obtuse marginals arise distally and supplies collaterals to  a large right coronary left ventricular branch which apparently has been totally occluded for greater than 10 years.  The right coronary artery is large.  It is totally occluded in the continuation after a small left ventricular branch arises.  The distal vessel is supplied by collaterals from left circumflex to the right coronary.  The mid to distal right coronary contains 90 to 95% eccentric angulated stenosis that enters a fusiform aneurysm =/> 4.5 mm in diameter).  The native vessel pre and post aneurysm is 3.5 mm.  The PDA is large and graftable.  Overall normal left ventricular function with EF 45 to 50%.  Normal LVEDP.  RECOMMENDATIONS:   TCTS consultation for consideration of multivessel grafting.  Significant blood pressure elevation was present this morning because no antihypertensive therapy was taken in the past 24 hours.  IV labetalol  was given to improve blood pressure.  He should take his blood pressure medication as soon as he arrives home.  Xarelto  should be resumed later this evening or in a.m. depending upon when he ordinarily takes it.  He should not take the Xarelto  before he has had antihypertensive therapy.  Findings Coronary Findings Diagnostic  Dominance: Right  Left Anterior Descending Mid LAD lesion is 80% stenosed.  First Diagonal Branch Vessel is small in size. 1st Diag lesion is 80% stenosed.  Left Circumflex Prox Cx to Mid Cx lesion is 65% stenosed.  First Obtuse Marginal Branch Vessel is small in size.  Right Coronary Artery Prox RCA to Mid RCA lesion is 55% stenosed. Mid RCA lesion is 95% stenosed. Non-stenotic Mid RCA to Dist RCA lesion.  Right Posterior Atrioventricular Artery RPAV lesion is 100% stenosed.  Third Right Posterolateral Branch Vessel is large in size. Collaterals 3rd RPL filled by collaterals from 3rd Mrg.  Intervention  No interventions have been documented.   STRESS TESTS  MYOCARDIAL PERFUSION IMAGING  01/23/2023  Interpretation Summary   The study is normal. The study is low risk.   Left ventricular function is abnormal. Global function is mioderately reduced. Nuclear stress EF: 43 %. The left ventricular ejection fraction is moderately decreased (30-44%). End diastolic cavity size is normal.   Prior study available for comparison from 12/14/2020.   ECHOCARDIOGRAM  ECHOCARDIOGRAM COMPLETE 12/16/2023  Narrative ECHOCARDIOGRAM REPORT    Patient Name:   Ryan Vaughan Date of Exam: 12/16/2023 Medical Rec #:  [id number]       Height:       76.0 in Accession #:    [id number]      Weight:       315.0 lb Date of Birth:  23-Jun-1946        BSA:          2.687 m Patient Age:    77 years        BP:           123/63 mmHg Patient Gender: M               HR:           65 bpm. Exam Location:  [facility]  Procedure: 2D Echo, Cardiac Doppler, Color Doppler and Strain Analysis (Both Spectral and Color Flow Doppler were utilized during procedure).  Indications:  DOE (dyspnea on exertion) [R06.09 (ICD-10-CM)]  History:        Patient has prior history of Echocardiogram examinations, most recent 12/21/2020. CAD, TIA and Stroke; Risk Factors:Hypertension, Diabetes and Dyslipidemia.  Sonographer:    Lynwood Silvas RDCS Referring Phys: [phone] DELON BROCKS Sricharan Lacomb  IMPRESSIONS   1. Left ventricular ejection fraction, by estimation, is 50 to 55%. The left ventricle has low normal function. The left ventricle has no regional wall motion abnormalities. The left ventricular internal cavity size was mildly dilated. There is moderate left ventricular hypertrophy. Left ventricular diastolic parameters are indeterminate. The average left ventricular global longitudinal strain is -9.5 %. The global longitudinal strain is abnormal. 2. Right ventricular systolic function is normal. The right ventricular size is normal. There is moderately elevated pulmonary artery systolic pressure. 3. Left atrial size was mild to  moderately dilated. 4. Right atrial size was moderately dilated. 5. The mitral valve is normal in structure. Mild mitral valve regurgitation. No evidence of mitral stenosis. 6. Tricuspid valve regurgitation is mild to moderate. 7. The aortic valve is calcified. There is mild calcification of the aortic valve. There is mild thickening of the aortic valve. Aortic valve regurgitation is mild. No aortic stenosis is present. 8. Aneurysm of the ascending aorta, measuring 43 mm. 9. The inferior vena cava is normal in size with greater than 50% respiratory variability, suggesting right atrial pressure of 3 mmHg.  FINDINGS Left Ventricle: Left ventricular ejection fraction, by estimation, is 50 to 55%. The left ventricle has low normal function. The left ventricle has no regional wall motion abnormalities. The average left ventricular global longitudinal strain is -9.5 %. Strain was performed and the global longitudinal strain is abnormal. The left ventricular internal cavity size was mildly dilated. There is moderate left ventricular hypertrophy. Left ventricular diastolic parameters are indeterminate.  Right Ventricle: The right ventricular size is normal. No increase in right ventricular wall thickness. Right ventricular systolic function is normal. There is moderately elevated pulmonary artery systolic pressure. The tricuspid regurgitant velocity is 3.16 m/s, and with an assumed right atrial pressure of 8 mmHg, the estimated right ventricular systolic pressure is 47.9 mmHg.  Left Atrium: Left atrial size was mild to moderately dilated.  Right Atrium: Right atrial size was moderately dilated.  Pericardium: There is no evidence of pericardial effusion.  Mitral Valve: The mitral valve is normal in structure. Mild mitral valve regurgitation. No evidence of mitral valve stenosis.  Tricuspid Valve: The tricuspid valve is normal in structure. Tricuspid valve regurgitation is mild to moderate. No evidence  of tricuspid stenosis.  Aortic Valve: The aortic valve is calcified. There is mild calcification of the aortic valve. There is mild thickening of the aortic valve. Aortic valve regurgitation is mild. No aortic stenosis is present.  Pulmonic Valve: The pulmonic valve was normal in structure. Pulmonic valve regurgitation is not visualized. No evidence of pulmonic stenosis.  Aorta: The aortic root is normal in size and structure. There is an aneurysm involving the ascending aorta measuring 43 mm.  Venous: The inferior vena cava is normal in size with greater than 50% respiratory variability, suggesting right atrial pressure of 3 mmHg.  IAS/Shunts: No atrial level shunt detected by color flow Doppler.  Additional Comments: 3D was performed not requiring image post processing on an independent workstation and was indeterminate.   LEFT VENTRICLE PLAX 2D LVIDd:         6.10 cm   Diastology LVIDs:         4.50  cm   LV e' medial:    5.44 cm/s LV PW:         1.50 cm   LV E/e' medial:  20.2 LV IVS:        1.50 cm   LV e' lateral:   8.59 cm/s LVOT diam:     2.60 cm   LV E/e' lateral: 12.8 LV SV:         96 LV SV Index:   36        2D Longitudinal Strain LVOT Area:     5.31 cm  2D Strain GLS Avg:     -9.5 %   RIGHT VENTRICLE            IVC RV Basal diam:  5.30 cm    IVC diam: 2.20 cm RV S prime:     8.16 cm/s TAPSE (M-mode): 1.6 cm  LEFT ATRIUM              Index        RIGHT ATRIUM           Index LA diam:        5.10 cm  1.90 cm/m   RA Area:     34.00 cm LA Vol (A2C):   138.0 ml 51.36 ml/m  RA Volume:   133.00 ml 49.50 ml/m LA Vol (A4C):   98.8 ml  36.77 ml/m LA Biplane Vol: 119.0 ml 44.29 ml/m AORTIC VALVE LVOT Vmax:   82.00 cm/s LVOT Vmean:  53.750 cm/s LVOT VTI:    0.181 m  AORTA Ao Root diam: 3.90 cm Ao Asc diam:  4.25 cm Ao Desc diam: 2.90 cm  MV E velocity: 110.00 cm/s  TRICUSPID VALVE TR Peak grad:   39.9 mmHg TR Vmax:        316.00 cm/s  SHUNTS Systemic VTI:   0.18 m Systemic Diam: 2.60 cm  Lamar Fitch MD Electronically signed by Lamar Fitch MD Signature Date/Time: 12/16/2023/9:53:27 AM    Final   TEE  ECHO INTRAOPERATIVE TEE 09/10/2019  Narrative *INTRAOPERATIVE TRANSESOPHAGEAL REPORT *    Patient Name:   Ryan Vaughan Date of Exam: 09/10/2019 Medical Rec #:  [id number]       Height:       76.0 in Accession #:    [id number]      Weight:       294.0 lb Date of Birth:  01-07-1946        BSA:          2.61 m Patient Age:    73 years        BP:           174/91 mmHg Patient Gender: M               HR:           75 bpm. Exam Location:  Anesthesiology  Transesophogeal exam was perform intraoperatively during surgical procedure. Patient was closely monitored under general anesthesia during the entirety of examination.  Indications:     Coronary artery disease Sonographer:     Recardo Rosa RDCS Performing Phys: Bernardino Mango MD Diagnosing Phys: Bernardino Mango MD  Complications: No known complications during this procedure. POST-OP IMPRESSIONS Overall, there were no significant changes from pre-bypass. - Right Ventricle: The right ventricle appears unchanged from pre-bypass. - Aorta: The aorta appears unchanged from pre-bypass. - Left Atrium: The left atrial appendage has been clipped. - Aortic Valve: The aortic valve appears unchanged from pre-bypass. -  Mitral Valve: There is mild regurgitation. - Tricuspid Valve: There is mild regurgitation. - Interatrial Septum: The interatrial septum appears unchanged from pre-bypass. - Interventricular Septum: The interventricular septum appears unchanged from pre-bypass. - Pericardium: The pericardium appears unchanged from pre-bypass.  PRE-OP FINDINGS Left Ventricle: The left ventricle has low normal systolic function, with an ejection fraction of 50-55%. The cavity size was moderately dilated. There is severely increased left ventricular wall thickness.  Right Ventricle: The  right ventricle has normal systolic function. The cavity was mildly enlarged. There is increased right ventricular wall thickness.  Left Atrium: Left atrial size was dilated.  Right Atrium: Right atrial size was dilated.  Interatrial Septum: No atrial level shunt detected by color flow Doppler.  Pericardium: There is no evidence of pericardial effusion.  Mitral Valve: The mitral valve is normal in structure. Mitral valve regurgitation is not visualized by color flow Doppler.  Tricuspid Valve: The tricuspid valve was normal in structure. Tricuspid valve regurgitation was not visualized by color flow Doppler.  Aortic Valve: The aortic valve is normal in structure. There is mild thickening of the aortic valve and there is mild calcification of the aortic valve. Aortic valve regurgitation is trivial by color flow Doppler. There is no evidence of aortic valve stenosis.  Pulmonic Valve: The pulmonic valve was normal in structure. Pulmonic valve regurgitation is not visualized by color flow Doppler.   Aorta: The aortic root, ascending aorta and aortic arch are normal in size and structure. There is evidence of plaque in the descending aorta.   Bernardino Mango MD Electronically signed by Bernardino Mango MD Signature Date/Time: 09/10/2019/2:47:01 PM    Final  MONITORS  LONG TERM MONITOR (3-14 DAYS) 07/29/2019  Narrative Ryan Vaughan, DOB 11-02-1945, MRN [id number]  EVENT MONITOR REPORT:   Patient was monitored from 07/15/2019 to 07/18/2019. Indication:                    Atrial fibrillation Ordering physician:  Shamiya Demeritt JONELLE Crape, MD Referring physician:  Lynzee Lindquist JONELLE Crape, MD   Baseline rhythm: Atrial fibrillation  Minimum heart rate: 34 BPM.  Average heart rate: 68 BPM.  Maximal heart rate 155 BPM.  Atrial arrhythmia: Patient remained in atrial fibrillation throughout the monitoring  Ventricular arrhythmia: Few PVCs.  One 7 beat nonsustained ventricular  tachycardia.  Conduction abnormality: None significant  Symptoms: None significant   Conclusion: Abnormal event monitor.  Patient remained in atrial fibrillation with fairly controlled ventricular rates.  One 7 beat nonsustained ventricular tachycardia as mentioned above.  Interpreting  cardiologist: Jacques Fife JONELLE Crape, MD Date: 08/05/2019 2:57 PM       ______________________________________________________________________________________________      Risk Assessment/Calculations:    CHA2DS2-VASc Score =     This indicates a  % annual risk of stroke. The patient's score is based upon:           Physical Exam:   VS:  BP (!) 160/82   Pulse 60   Ht 6' 4 (1.93 m)   Wt (!) 308 lb (139.7 kg)   SpO2 96%   BMI 37.49 kg/m    Wt Readings from Last 3 Encounters:  05/05/24 (!) 308 lb (139.7 kg)  04/15/24 (!) 311 lb (141.1 kg)  03/03/24 300 lb (136.1 kg)    GEN: Well nourished, well developed in no acute distress NECK: No JVD; No carotid bruits CARDIAC: irregularly irregular, no murmurs, rubs, gallops RESPIRATORY: expiratory wheezing RUL, clear otherwise ABDOMEN: Soft, non-tender, non-distended EXTREMITIES:  no pedal  edema; No deformity   ASSESSMENT AND PLAN: .   CAD -s/p CABG x 4 in 2020 >> Lexiscan  in April 2024 was normal, low risk study.  Stable with no anginal symptoms. No indication for ischemic evaluation.  Continue nitroglycerin  as needed, continue Coreg  12.5 mg twice daily.   Atrial fibrillation/hypercoagulable state-currently on Xarelto  20 mg daily--creatinine clearance 97 so there is no indication for dose reduction,  hemoglobin 12.2 hematocrit 37.7 on 03/30/2024.  CHA2DS2-VASc score of 7. Continue Coreg  12.5 mg twice daily.   Pedal edema - will continue lasix  20 mg daily for now, no appreciable pedal edema.   HTN -blood pressure is elevated at  160/82 however at home was 140/70, wife states if it is much lower he is dizzy and almost incapacitated. He checks BP twice  daily, most readings are 130/80, for now, continue lisinopril  10 mg daily, continue Coreg  12.5 mg twice daily.  Aneurysm of ascending aorta-stable at 43 mm, CT of chest arranged in 1 year for surveillance.  Cough -persistent for the last 2 months I do not think it is related to his lisinopril  use.  He does have some mild wheezing today suggested over-the-counter Claritin and Flonase, follow-up with PCP if his symptoms persist.       Dispo: follow up in 6 months.   Signed, Delon JAYSON Hoover, NP

## 2024-05-04 NOTE — Telephone Encounter (Signed)
 Called patients daughter and let her know Dr .Cyndia advice

## 2024-05-05 ENCOUNTER — Encounter: Payer: Self-pay | Admitting: Cardiology

## 2024-05-05 ENCOUNTER — Ambulatory Visit: Attending: Cardiology | Admitting: Cardiology

## 2024-05-05 VITALS — BP 140/70 | HR 60 | Ht 76.0 in | Wt 308.0 lb

## 2024-05-05 DIAGNOSIS — D6859 Other primary thrombophilia: Secondary | ICD-10-CM | POA: Diagnosis not present

## 2024-05-05 DIAGNOSIS — I2510 Atherosclerotic heart disease of native coronary artery without angina pectoris: Secondary | ICD-10-CM

## 2024-05-05 DIAGNOSIS — Z951 Presence of aortocoronary bypass graft: Secondary | ICD-10-CM | POA: Diagnosis not present

## 2024-05-05 DIAGNOSIS — I7121 Aneurysm of the ascending aorta, without rupture: Secondary | ICD-10-CM

## 2024-05-05 DIAGNOSIS — R058 Other specified cough: Secondary | ICD-10-CM | POA: Diagnosis not present

## 2024-05-05 DIAGNOSIS — I10 Essential (primary) hypertension: Secondary | ICD-10-CM | POA: Diagnosis not present

## 2024-05-05 DIAGNOSIS — I4821 Permanent atrial fibrillation: Secondary | ICD-10-CM | POA: Diagnosis not present

## 2024-05-05 NOTE — Patient Instructions (Signed)
 Medication Instructions:   *If you need a refill on your cardiac medications before your next appointment, please call your pharmacy*  Lab Work:   If you have labs (blood work) drawn today and your tests are completely normal, you will receive your results only by: MyChart Message (if you have MyChart) OR A paper copy in the mail If you have any lab test that is abnormal or we need to change your treatment, we will call you to review the results.  Testing/Procedures:   Follow-Up: At [facility], you and your health needs are our priority.  As part of our continuing mission to provide you with exceptional heart care, our providers are all part of one team.  This team includes your primary Cardiologist (physician) and Advanced Practice Providers or APPs (Physician Assistants and Nurse Practitioners) who all work together to provide you with the care you need, when you need it.  Your next appointment:   6 month(s)  Provider:   Darcie Mellone Crape, MD    We recommend signing up for the patient portal called MyChart.  Sign up information is provided on this After Visit Summary.  MyChart is used to connect with patients for Virtual Visits (Telemedicine).  Patients are able to view lab/test results, encounter notes, upcoming appointments, etc.  Non-urgent messages can be sent to your provider as well.   To learn more about what you can do with MyChart, go to [email or web address].   Other Instructions

## 2024-05-28 DIAGNOSIS — M170 Bilateral primary osteoarthritis of knee: Secondary | ICD-10-CM | POA: Diagnosis not present

## 2024-06-07 ENCOUNTER — Other Ambulatory Visit: Payer: Self-pay | Admitting: Cardiology

## 2024-06-08 NOTE — Telephone Encounter (Signed)
 Prescription refill request for Xarelto  received.  Indication:afib Last office visit:8/25 Weight:139.7  kg Age:78 Scr:1.29  6/25 CrCl:94.76  ml/min  Prescription refilled

## 2024-06-09 ENCOUNTER — Ambulatory Visit: Admitting: Physician Assistant

## 2024-06-26 ENCOUNTER — Other Ambulatory Visit: Payer: Self-pay | Admitting: Cardiology

## 2024-07-01 DIAGNOSIS — J208 Acute bronchitis due to other specified organisms: Secondary | ICD-10-CM | POA: Diagnosis not present

## 2024-07-20 DIAGNOSIS — D62 Acute posthemorrhagic anemia: Secondary | ICD-10-CM | POA: Diagnosis not present

## 2024-07-20 DIAGNOSIS — E785 Hyperlipidemia, unspecified: Secondary | ICD-10-CM | POA: Diagnosis not present

## 2024-07-20 DIAGNOSIS — I2510 Atherosclerotic heart disease of native coronary artery without angina pectoris: Secondary | ICD-10-CM | POA: Diagnosis not present

## 2024-07-20 DIAGNOSIS — I10 Essential (primary) hypertension: Secondary | ICD-10-CM | POA: Diagnosis not present

## 2024-07-20 DIAGNOSIS — E1165 Type 2 diabetes mellitus with hyperglycemia: Secondary | ICD-10-CM | POA: Diagnosis not present

## 2024-07-20 DIAGNOSIS — M109 Gout, unspecified: Secondary | ICD-10-CM | POA: Diagnosis not present

## 2024-07-20 DIAGNOSIS — Z125 Encounter for screening for malignant neoplasm of prostate: Secondary | ICD-10-CM | POA: Diagnosis not present

## 2024-07-20 DIAGNOSIS — Z23 Encounter for immunization: Secondary | ICD-10-CM | POA: Diagnosis not present

## 2024-07-20 DIAGNOSIS — F331 Major depressive disorder, recurrent, moderate: Secondary | ICD-10-CM | POA: Diagnosis not present

## 2024-07-20 DIAGNOSIS — I4891 Unspecified atrial fibrillation: Secondary | ICD-10-CM | POA: Diagnosis not present

## 2024-07-20 DIAGNOSIS — G300 Alzheimer's disease with early onset: Secondary | ICD-10-CM | POA: Diagnosis not present

## 2024-07-20 DIAGNOSIS — F0280 Dementia in other diseases classified elsewhere without behavioral disturbance: Secondary | ICD-10-CM | POA: Diagnosis not present

## 2024-07-20 DIAGNOSIS — E1129 Type 2 diabetes mellitus with other diabetic kidney complication: Secondary | ICD-10-CM | POA: Diagnosis not present

## 2024-07-27 ENCOUNTER — Other Ambulatory Visit: Payer: Self-pay | Admitting: Cardiology

## 2024-08-20 DIAGNOSIS — G309 Alzheimer's disease, unspecified: Secondary | ICD-10-CM | POA: Diagnosis not present

## 2024-08-20 DIAGNOSIS — F02818 Dementia in other diseases classified elsewhere, unspecified severity, with other behavioral disturbance: Secondary | ICD-10-CM | POA: Diagnosis not present

## 2024-08-20 DIAGNOSIS — I10 Essential (primary) hypertension: Secondary | ICD-10-CM | POA: Diagnosis not present

## 2024-08-28 ENCOUNTER — Other Ambulatory Visit: Payer: Self-pay | Admitting: Cardiology

## 2024-09-12 ENCOUNTER — Encounter (HOSPITAL_BASED_OUTPATIENT_CLINIC_OR_DEPARTMENT_OTHER): Payer: Self-pay

## 2024-09-12 ENCOUNTER — Emergency Department (HOSPITAL_BASED_OUTPATIENT_CLINIC_OR_DEPARTMENT_OTHER)
Admission: EM | Admit: 2024-09-12 | Discharge: 2024-09-12 | Disposition: A | Discharge status: Home / Self Care | Attending: Emergency Medicine | Admitting: Emergency Medicine

## 2024-09-12 ENCOUNTER — Other Ambulatory Visit: Payer: Self-pay

## 2024-09-12 DIAGNOSIS — J101 Influenza due to other identified influenza virus with other respiratory manifestations: Secondary | ICD-10-CM | POA: Insufficient documentation

## 2024-09-12 DIAGNOSIS — Z7982 Long term (current) use of aspirin: Secondary | ICD-10-CM | POA: Insufficient documentation

## 2024-09-12 DIAGNOSIS — J069 Acute upper respiratory infection, unspecified: Secondary | ICD-10-CM

## 2024-09-12 DIAGNOSIS — Z79899 Other long term (current) drug therapy: Secondary | ICD-10-CM | POA: Insufficient documentation

## 2024-09-12 LAB — RESP PANEL BY RT-PCR (RSV, FLU A&B, COVID)  RVPGX2
Influenza A by PCR: POSITIVE — AB
Influenza B by PCR: NEGATIVE
Resp Syncytial Virus by PCR: NEGATIVE
SARS Coronavirus 2 by RT PCR: NEGATIVE

## 2024-09-12 MED ORDER — OSELTAMIVIR PHOSPHATE 75 MG PO CAPS: 75.0000 mg | ORAL_CAPSULE | Freq: Two times a day (BID) | ORAL | 0 refills | Status: DC

## 2024-09-12 NOTE — ED Provider Notes (Signed)
 [place] [facility] Provider Note   CSN: [id number] Arrival date & time: 09/12/24  9142     Patient presents with: Cough   Ryan Vaughan is a 78 y.o. male.  He has been sick for couple days with nonproductive cough.  Wife was seen earlier and tested positive for flu and ultimately expired with sepsis.  He has had no fever nausea vomiting.  He said he otherwise feels pretty well.  Does want treatment with Tamiflu .   The history is provided by the patient.  Cough Cough characteristics:  Non-productive Sputum characteristics:  Nondescript Onset quality:  Gradual Duration:  3 days Timing:  Intermittent Progression:  Unchanged Chronicity:  New Context: sick contacts   Relieved by:  None tried Associated symptoms: no fever and no shortness of breath        Prior to Admission medications  Medication Sig Start Date End Date Taking? Authorizing Provider  acetaminophen  (TYLENOL ) 500 MG tablet Take 500-1,500 mg by mouth every 8 (eight) hours as needed for moderate pain or headache.    [provider]  aspirin  EC 81 MG tablet Take 81 mg by mouth daily.    [provider]  atorvastatin  (LIPITOR ) 80 MG tablet Take 1 tablet by mouth daily. 09/02/24   Revankar, Jennifer SAUNDERS, MD  carvedilol  (COREG ) 12.5 MG tablet Take 1 tablet (12.5 mg total) by mouth 2 (two) times daily. 07/27/24   Carlin Delon BROCKS, NP  cyanocobalamin  (VITAMIN B12) 1000 MCG tablet Take 1,000 mcg by mouth daily.    [provider]  donepezil (ARICEPT) 10 MG tablet Take 10 mg by mouth at bedtime. 09/06/23   [provider]  escitalopram (LEXAPRO) 20 MG tablet Take 20 mg by mouth daily. 09/16/23   [provider]  FLUoxetine (PROZAC) 20 MG capsule Take 20 mg by mouth daily. 03/25/24   [provider]  furosemide  (LASIX ) 20 MG tablet Take 1 tablet (20 mg total) by mouth daily. 10/07/23   Revankar, Rajan R, MD  gabapentin (NEURONTIN) 800 MG  tablet Take 800 mg by mouth as needed for pain.    [provider]  glimepiride  (AMARYL ) 4 MG tablet Take 4 mg by mouth 2 (two) times daily. 08/10/19   [provider]  LANTUS SOLOSTAR 100 UNIT/ML Solostar Pen Inject 25 Units into the skin at bedtime. 05/15/22   [provider]  lisinopril  (ZESTRIL ) 10 MG tablet Take 1 tablet (10 mg total) by mouth daily. 06/26/24   Revankar, Jennifer SAUNDERS, MD  memantine (NAMENDA) 10 MG tablet Take 10 mg by mouth 2 (two) times daily. 02/27/24   [provider]  nitroGLYCERIN  (NITROSTAT ) 0.4 MG SL tablet Place 1 tablet (0.4 mg total) under the tongue every 5 (five) minutes as needed for chest pain. 08/24/19   Revankar, Jennifer SAUNDERS, MD  pantoprazole  (PROTONIX ) 40 MG tablet Take 1 tablet (40 mg total) by mouth 2 (two) times daily. 03/03/24   Charlanne Groom, MD  Vitamin D, Ergocalciferol, (DRISDOL) 1.25 MG (50000 UT) CAPS capsule Take 50,000 Units by mouth every Friday.     [provider]  XARELTO  20 MG TABS tablet Take 1 tablet by mouth daily with supper. 06/08/24   Revankar, Jennifer SAUNDERS, MD    Allergies: Penicillins, Amiodarone , Isosorbide  mononitrate er [isosorbide  dinitrate], and Losartan     Review of Systems  Constitutional:  Negative for fever.  Respiratory:  Positive for cough. Negative for shortness of breath.     Updated Vital  Signs BP (!) 141/105 (BP Location: Right Arm)   Pulse 85   Temp 98.6 F (37 C) (Oral)   Resp 18   Ht 6' 4 (1.93 m)   Wt (!) 139.7 kg   SpO2 96%   BMI 37.49 kg/m   Physical Exam Vitals and nursing note reviewed.  Constitutional:      Appearance: Normal appearance. He is well-developed.  HENT:     Head: Normocephalic and atraumatic.  Eyes:     Conjunctiva/sclera: Conjunctivae normal.  Cardiovascular:     Rate and Rhythm: Normal rate and regular rhythm.  Pulmonary:     Effort: Pulmonary effort is normal.     Breath sounds: No wheezing or rhonchi.  Musculoskeletal:     Cervical back: Neck  supple.  Skin:    General: Skin is warm and dry.  Neurological:     General: No focal deficit present.     Mental Status: He is alert.     GCS: GCS eye subscore is 4. GCS verbal subscore is 5. GCS motor subscore is 6.     (all labs ordered are listed, but only abnormal results are displayed) Labs Reviewed  RESP PANEL BY RT-PCR (RSV, FLU A&B, COVID)  RVPGX2 - Abnormal; Notable for the following components:      Result Value   Influenza A by PCR POSITIVE (*)    All other components within normal limits    EKG: None  Radiology: No results found.   Procedures   Medications Ordered in the ED - No data to display                                  Medical Decision Making Risk Prescription drug management.   This patient complains of cough; this involves an extensive number of treatment Options and is a complaint that carries with it a high risk of complications and morbidity. The differential includes COVID, flu, pneumonia  I ordered, reviewed and interpreted labs, which included flu positive Additional history obtained from patient's family member Previous records obtained and reviewed in epic including outpatient cardiology notes Social determinants considered, no significant barriers Critical Interventions: None  After the interventions stated above, I reevaluated the patient and found patient to be nontoxic-appearing with stable vitals Admission and further testing considered, no indications for admission or further workup at this time.  Will treat symptomatically with Tamiflu .  Recommend close follow-up with PCP.  Return instructions discussed      Final diagnoses:  Viral URI with cough  Influenza A    ED Discharge Orders     None          Towana Ozell BROCKS, MD 09/12/24 1650

## 2024-09-12 NOTE — ED Triage Notes (Signed)
 Arrives with complaints of ongoing cough x2 days. Patient was recently exposed to the flu as well.

## 2024-11-03 ENCOUNTER — Encounter: Payer: Self-pay | Admitting: Cardiology

## 2024-11-03 ENCOUNTER — Ambulatory Visit: Admitting: Cardiology

## 2024-11-03 VITALS — BP 112/84 | HR 72 | Ht 75.5 in | Wt 317.0 lb

## 2024-11-03 DIAGNOSIS — I480 Paroxysmal atrial fibrillation: Secondary | ICD-10-CM

## 2024-11-03 DIAGNOSIS — R079 Chest pain, unspecified: Secondary | ICD-10-CM

## 2024-11-03 DIAGNOSIS — E785 Hyperlipidemia, unspecified: Secondary | ICD-10-CM | POA: Diagnosis not present

## 2024-11-03 DIAGNOSIS — E0841 Diabetes mellitus due to underlying condition with diabetic mononeuropathy: Secondary | ICD-10-CM | POA: Diagnosis not present

## 2024-11-03 DIAGNOSIS — I2510 Atherosclerotic heart disease of native coronary artery without angina pectoris: Secondary | ICD-10-CM

## 2024-11-03 DIAGNOSIS — Z951 Presence of aortocoronary bypass graft: Secondary | ICD-10-CM

## 2024-11-03 DIAGNOSIS — I7121 Aneurysm of the ascending aorta, without rupture: Secondary | ICD-10-CM | POA: Diagnosis not present

## 2024-11-03 DIAGNOSIS — Z9889 Other specified postprocedural states: Secondary | ICD-10-CM

## 2024-11-03 MED ORDER — NITROGLYCERIN 0.4 MG SL SUBL: 0.4000 mg | SUBLINGUAL_TABLET | SUBLINGUAL | 4 refills | Status: AC | PRN

## 2024-11-03 NOTE — Patient Instructions (Signed)
 Medication Instructions:  Your physician recommends that you continue on your current medications as directed. Please refer to the Current Medication list given to you today.  *If you need a refill on your cardiac medications before your next appointment, please call your pharmacy*   Lab Work: None ordered If you have labs (blood work) drawn today and your tests are completely normal, you will receive your results only by: MyChart Message (if you have MyChart) OR A paper copy in the mail If you have any lab test that is abnormal or we need to change your treatment, we will call you to review the results.   Testing/Procedures: CT Chest [facility]    Follow-Up: At [facility], you and your health needs are our priority.  As part of our continuing mission to provide you with exceptional heart care, we have created designated Provider Care Teams.  These Care Teams include your primary Cardiologist (physician) and Advanced Practice Providers (APPs -  Physician Assistants and Nurse Practitioners) who all work together to provide you with the care you need, when you need it.  We recommend signing up for the patient portal called MyChart.  Sign up information is provided on this After Visit Summary.  MyChart is used to connect with patients for Virtual Visits (Telemedicine).  Patients are able to view lab/test results, encounter notes, upcoming appointments, etc.  Non-urgent messages can be sent to your provider as well.   To learn more about what you can do with MyChart, go to [email or web address].    Your next appointment:   9 month(s)  The format for your next appointment:   In Person  Provider:   Jennifer Crape, MD    Other Instructions none  Important Information About Sugar

## 2024-11-04 NOTE — Progress Notes (Signed)
 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 RH male with a history of hypertension, hyperlipidemia, anemia, atrial fibrillation on chronic Xarelto , history of CVA in 2015, CAD, RAS, vitamin D deficiency, DM2, bilateral carotid artery stenosis, RP1 and LP2 stenosis per arteriogram by IR, GERD and a diagnosis of mild cognitive impairment per neuropsych evaluation presenting today in follow-up for evaluation of memory loss. Patient is on donepezil  10 mg daily and memantine  10 mg twice a day per PCP. Patient was last seen on 04/15/2024. Memory is stable with MMSE 21/30.Patient is still able to participate on ADLs. HE prefers not to drive,  Mood is controlled with Risperdal. This patient is accompanied in the office by his daughter who supplements the history. Previous records as well as any outside records available were reviewed prior to todays visit   Follow up in 6 months Continue donepezil  10 mg daily and memantine  10 mg twice daily as per PCP, side effects discussed Continue to control mood as per PCP, he is on Risperdal Recommend good control of cardiovascular risk factors is on Xarelto  Recommend increasing socialization and physical activities  Recommend checking hearing get in an effort to improve comprehension      Discussed the use of AI scribe software for clinical note transcription with the patient, who gave verbal consent to proceed.  History of Present Illness   Memory symptoms have remained stable since the last visit in July 2025. He continues to experience short-term memory impairment, particularly with recent conversations and names of new acquaintances, while long-term memory remains intact. He occasionally repeats himself and asks or tells his daughter the same things multiple times. He remains oriented to his home and neighborhood and does not experience disorientation. He has improved in  keeping track of his keys by using a designated rack, but now more frequently misplaces his wallet and cell phone. He is currently taking donepezil  10 mg daily and memantine  10 mg twice daily, with medication management overseen by his daughter using a weekly pill box. Adherence has improved since he assumed responsibility after the sudden death of his wife in 2025/09/21 .His daughter manages his finances.  Behavioral symptoms include periods of irritability and agitation, which became severe in September 2025. He was previously on fluoxetine, but this was discontinued by his wife, and subsequently his primary care physician initiated risperidone for agitation with great control.  He denies hallucinations, delusions, or seizures. Mood symptoms include some sadness and boredom, particularly following the recent death of his wife to acute sepsis after flu, but he denies any worsening depressive symptoms. He has little interest in activities and describes himself as lazy and bored. He does not initiate activities independently and relies on his daughters for engagement in household chores. He enjoys visiting his daughter's house for dinner and spending time with grandchildren. He has lost interest in driving and now only drives short distances preferring others to drive for longer trips.  Sleep has improved. Previously, he would wake up at 2-3 AM, turn on lights, and disturb his wife, but this has not occurred since his daughter has been staying with him more frequently. He denies nightmares, vivid dreams, or acting out dreams. He does not experience sleep-related movements. Hygiene is generally maintained, with daily showers, though he requires prompting from his daughter.  He has osteoarthritis in both knees, described as bone on bone. He has not received knee injections  in the past 4-5 months and is not currently considering knee replacement. He ambulates without a cane except occasionally when walking to  the mailbox. He has experienced a few mechanical falls, attributed to slipping on hickory nuts in the yard, without loss of consciousness or injury.  Urinary symptoms include nocturia and occasional urge incontinence at night, with rare episodes of not reaching the bathroom in time. He denies daytime incontinence. Bowel function is normal without constipation or diarrhea.  Appetite is good, possibly increased, with regular meals prepared by his daughter and sister. No dysphagia or choking. No headaches, visual loss, focal weakness, or speech difficulties. He has not seen an ophthalmologist in over a year. No recent urinary tract infections.  He lives alone, with family nearby and his daughter providing regular support. Cameras have been installed in the home for safety and medication monitoring. He is not planning to move to an assisted or independent  living facility at this time.       Initial visit May 2024 How long did patient have memory difficulties? For about 1 year, worse over the last 6 months, especially wit STM.  Family noticed memory changes since Christmas of 2023.  He reports that he forgets new information.  He initially attributed  the symptoms to some depression but for the last 3 months, he has significant difficulty with numbers.  He denies any issues with LTM . repeats oneself?  Endorsed every 5 minutes he asks the same question and tells the same story-wife reports.  (He did not repeat himself during this visit) Disoriented when walking into a room?  Patient denies except occasionally not remembering what patient came to the room for   Leaving objects in unusual places?   denies other than occasionally misplacing his glasses.  Wandering behavior? denies   Any personality changes ? denies   Any history of depression?:  Patient denies but daughter reports that he may be experiencing situational depression, with less desire of doing activities outside, having a feeling of  worthlessness, decreased appetite.  His daughter states that my parents do not believe in mental illness but some of his friends have noticed that he is depressed. Hallucinations or paranoia?  denies   Seizures? denies    Any sleep changes?  Sleeps well too much - daughter says.  Denies vivid dreams, REM behavior or sleepwalking   Sleep apnea? denies   Any hygiene concerns?  denies   Independent of bathing and dressing?  Endorsed  Does the patient need help with medications? Wife is in charge for the last 2 months as he was not taking Xarelto .    Who is in charge of the finances? Patient  is in charge     Any changes in appetite? Decreased. Sees GI because he has early satiation, for endoscopy in 04/2023.   Patient have trouble swallowing?  denies   Does the patient cook? No  Any kitchen accidents such as leaving the stove on? Patient denies   Any headaches?  denies   Chronic back pain?  Knee and back chronic pain due to arthritis.  I need surgery  Ambulates with difficulty? denies   Recent falls or head injuries? denies     Vision changes? Unilateral weakness, numbness or tingling?  denies   Any tremors?  denies   Any anosmia?  denies   Any incontinence of urine? denies   Any bowel dysfunction? denies      Patient lives with wife  History of heavy alcohol  intake? denies   History of heavy tobacco use? denies   Family history of dementia?  Denies  Does patient drive? Yes. Daughter says sometimes he has to think a little more about the directions, but she not worried about him getting lost.  Retired education administrator.  MRI of the brain personally reviewed remarkable for progressed ischemia in the L PCA territory since a November MRI, with a moderate sized late subacute to early chronic cortical infarct .  Associated hemosiderin but no malignant hemorrhagic transformation or associated mass effect was noted.  Also seen, moderately advanced small vessel disease without changes from prior film.      Bilateral common carotid arteriograms, right vertebral arteriogram and left subclavian arteriogram by IR 05/17/2023 showing severe stenosis of the right posterior cerebral artery P1 segment and to a lesser degree of the left posterior cerebral artery, moderate stenosis of the right middle cerebral M1 segment, nonopacification of the left vertebral artery proximally.  IR and vascular surgery are recommending a repeat angiogram 6 months after this one, to further evaluate the R P1 L P2 stenosis    MRA of the head and neck read by radiology 01/30/2024 with stable intracranial MRA compared to prior CTA from 04/16/2023, with stable MRA of the neck as compared to prior CTA from 04/16/2023. 2. Atheromatous change about the left carotid bulb with no more than mild 25% stenosis by NASCET criteria. 3. Wide patency of the right carotid artery system within the neck. 4. Moderate atheromatous stenoses at the origins of the vertebral arteries bilaterally. Vertebral arteries otherwise widely patent.     Neuropsych evaluation 11/2023 Briefly, results suggested severe impairment surrounding all aspects of learning and memory. Additional impairments were exhibited across complex attention, executive functioning, and verbal fluency (phonemic worse than semantic), while performance was variable across processing speed and visuospatial abilities. The cause for ongoing memory impairment is unclear at the present time. Prior neuroimaging has suggested moderately advanced microvascular ischemic disease, a tiny left posterior frontal vertex stroke, and another more recent stroke affecting left PCA circulation. Specific to the latter, depending on the size and extension into temporal lobe structures, left PCA strokes can create some verbal memory dysfunction assuming normal brain lateralization in this right-handed individual. The combination of these factors creating ongoing dysfunction is at least a plausible conceptualization.  Despite this, I do have concern surrounding an underlying neurodegenerative illness, namely Alzheimer's disease. Across verbal and visual memory testing, Mr. Minnie was basically amnestic (i.e., 0% retention across list and figure tasks, 17% across a story task) after a brief delay and performed very poorly across yes/no recognition trials. This suggests evidence for rapid forgetting and a prominent storage impairment, both of which represent the hallmark testing characteristics of this illness. Intact confrontation naming is encouraging and could suggest this illness being in early stages if it is indeed present. While his left PCA infarct could account for some verbal memory impairment, it would not explain complete amnesia surrounding visual memory. Overall, a mixed etiology may fit the testing data best and ongoing medical monitoring will be very important moving forward to better determine the risk for underlying Alzheimer's disease.         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   Atrial fibrillation    CAD (coronary artery disease) 11/05/2019   Carotid artery disease    Carotid artery stenosis 05/14/2017   Cerebrovascular disease    moderately advanced per MRI    Chronic anticoagulation  04/08/2014   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   Coronary artery disease    Diabetic neuropathy 12/03/2022   Dyslipidemia 05/04/2015   Emphysema of lung    Essential hypertension 04/08/2014   Gout 01/24/2018   Hammertoes of both feet 11/14/2022   Hearing loss    bilateral - no hearing aids   History of carotid endarterectomy 05/14/2017   Hx of cerebral artery stenosis 12/03/2023   Iron deficiency anemia due to chronic blood loss 03/03/2024   Lacunar infarction    b/l dorsal thalami    Left PCA stroke 03/2023   Major depressive disorder    Mild neurocognitive disorder due to multiple etiologies 11/12/2023   Osteoarthritis of left knee  01/24/2018   Plantar fasciitis of left foot 12/03/2022   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 Ascending colon polyp status post polypectomy.   CORONARY ARTERY BYPASS GRAFT N/A 09/10/2019   Procedure: CORONARY ARTERY BYPASS GRAFTING (CABG) x4 , using left internal mammary artery, and right leg greater saphenous vein harvested endoscopically;  Surgeon: Lucas Dorise POUR, MD;  Location: [facility] OR;  Service: Open Heart Surgery;  Laterality: N/A;   ENTEROSCOPY N/A 03/03/2024   Procedure: ENTEROSCOPY;  Surgeon: Charlanne Groom, MD;  Location: [facility] ENDOSCOPY;  Service: Gastroenterology;  Laterality: N/A;  Push Enteroscopy   IR ANGIO INTRA EXTRACRAN SEL COM CAROTID INNOMINATE BILAT MOD SED  05/17/2023   IR ANGIO VERTEBRAL SEL VERTEBRAL UNI R MOD SED  05/17/2023   IR RADIOLOGIST EVAL & MGMT  05/13/2023   IR US  GUIDE VASC ACCESS RIGHT  05/17/2023   LEFT HEART CATH AND CORONARY ANGIOGRAPHY N/A 08/28/2019   Procedure: LEFT HEART CATH AND CORONARY ANGIOGRAPHY;  Surgeon: Claudene Victory ORN, MD;  Location: [facility] INVASIVE CV LAB;  Service: Cardiovascular;  Laterality: N/A;   SIGMOIDOSCOPY  11/17/2012   Minimal residual polyp, status post  polypectomy. Small internal hemorrhoids.   TEE WITHOUT CARDIOVERSION N/A 09/10/2019   Procedure: TRANSESOPHAGEAL ECHOCARDIOGRAM (TEE);  Surgeon: Lucas Dorise POUR, MD;  Location: [facility] OR;  Service: Open Heart Surgery;  Laterality: N/A;         Objective:     PHYSICAL EXAMINATION:    VITALS:   Vitals:   11/05/24 1115  BP: (!) 155/77  Pulse: 83  Resp: 20  SpO2: 97%  Weight: (!) 317 lb (143.8 kg)    GEN:  The patient appears stated age and is in NAD. HEENT:  Normocephalic, atraumatic.   Neurological examination:  General: NAD, well-groomed, appears stated age. Orientation: The patient is alert. Oriented to person, place and not to date.  Cranial nerves: There is good facial symmetry.The speech is fluent and clear.  No aphasia or dysarthria. Fund of knowledge is appropriate. Recent memory impaired and remote memory is normal.  Attention and concentration are normal.  Able to name objects and repeat phrases.  Hearing is decreased to conversational tone.   Delayed recall 0/3 Sensation: Sensation is intact to light touch throughout Motor: Strength is at least antigravity x4. DTR's 2/4 in UE/LE      02/15/2023    9:00 AM  Montreal Cognitive Assessment   Visuospatial/ Executive (0/5) 3  Naming (0/3) 2  Attention: Read list of digits (0/2) 2  Attention: Read list of letters (0/1) 1  Attention: Serial 7 subtraction starting at 100 (0/3) 3  Language: Repeat phrase (0/2) 0  Language : Fluency (0/1) 0  Abstraction (0/2) 2  Delayed Recall (0/5) 0  Orientation (0/6) 2  Total 15  Adjusted Score (based on education) 16       11/05/2024   12:00 PM 04/15/2024    5:00 PM  MMSE - Mini Mental State Exam  Orientation to time 3 2  Orientation to Place 3 3  Registration 3 3  Attention/ Calculation 4 4  Recall 0 0  Language- name 2 objects 2 2  Language- repeat 1 1  Language- follow 3 step command 3 3  Language- read & follow direction 1 1  Write a sentence 1 0  Copy design 0 0  Total  score 21 19      Movement examination: Tone: There is normal tone in the UE/LE Abnormal movements:  no tremor.  No myoclonus.  No asterixis.   Coordination:  There is no decremation with RAM's. Normal finger to nose  Gait and Station: The patient has some difficulty arising out of a deep-seated chair without the use of the hands due to severe knee arthritis. The patient's stride length is good.  Gait is cautious and narrow.   Thank you for allowing us  the opportunity to participate in the care of this nice patient. Please do not hesitate to contact us  for any questions or concerns.   Total time spent on today's visit was 31 minutes dedicated to this patient today, preparing to see patient, examining the patient, ordering tests and/or medications and counseling the patient, documenting clinical information in the EHR or other health record, independently interpreting results and communicating results to the patient/family, discussing treatment and goals, answering patient's questions and coordinating care.  Cc:  Keren Vicenta BRAVO, MD  Camie Sevin 11/05/2024 12:14 PM      "

## 2024-11-05 ENCOUNTER — Ambulatory Visit: Admitting: Physician Assistant

## 2024-11-05 VITALS — BP 155/77 | HR 83 | Resp 20 | Wt 317.0 lb

## 2024-11-05 DIAGNOSIS — F0670 Mild neurocognitive disorder due to known physiological condition without behavioral disturbance: Secondary | ICD-10-CM | POA: Diagnosis not present

## 2024-11-05 MED ORDER — DONEPEZIL HCL 10 MG PO TABS: 10.0000 mg | ORAL_TABLET | Freq: Every day | ORAL | 3 refills | Status: AC

## 2024-11-05 MED ORDER — MEMANTINE HCL 10 MG PO TABS: 10.0000 mg | ORAL_TABLET | Freq: Two times a day (BID) | ORAL | 3 refills | Status: AC

## 2024-11-05 NOTE — Patient Instructions (Signed)
 It was a pleasure to see you today at our office.   Recommendations:   Continue donepezil  and memantine    Follow up  6 months       RECOMMENDATIONS FOR ALL PATIENTS WITH MEMORY PROBLEMS: 1. Continue to exercise (Recommend 30 minutes of walking everyday, or 3 hours every week) 2. Increase social interactions - continue going to [place] and enjoy social gatherings with friends and family 3. Eat healthy, avoid fried foods and eat more fruits and vegetables 4. Maintain adequate blood pressure, blood sugar, and blood cholesterol level. Reducing the risk of stroke and cardiovascular disease also helps promoting better memory. 5. Avoid stressful situations. Live a simple life and avoid aggravations. Organize your time and prepare for the next day in anticipation. 6. Sleep well, avoid any interruptions of sleep and avoid any distractions in the bedroom that may interfere with adequate sleep quality 7. Avoid sugar, avoid sweets as there is a strong link between excessive sugar intake, diabetes, and cognitive impairment We discussed the Mediterranean diet, which has been shown to help patients reduce the risk of progressive memory disorders and reduces cardiovascular risk. This includes eating fish, eat fruits and green leafy vegetables, nuts like almonds and hazelnuts, walnuts, and also use olive oil. Avoid fast foods and fried foods as much as possible. Avoid sweets and sugar as sugar use has been linked to worsening of memory function.  There is always a concern of gradual progression of memory problems. If this is the case, then we may need to adjust level of care according to patient needs. Support, both to the patient and caregiver, should then be put into place.       FALL PRECAUTIONS: Be cautious when walking. Scan the area for obstacles that may increase the risk of trips and falls. When getting up in the mornings, sit up at the edge of the bed for a few minutes before getting out of bed.  Consider elevating the bed at the head end to avoid drop of blood pressure when getting up. Walk always in a well-lit room (use night lights in the walls). Avoid area rugs or power cords from appliances in the middle of the walkways. Use a walker or a cane if necessary and consider physical therapy for balance exercise. Get your eyesight checked regularly.  FINANCIAL OVERSIGHT: Supervision, especially oversight when making financial decisions or transactions is also recommended.  HOME SAFETY: Consider the safety of the kitchen when operating appliances like stoves, microwave oven, and blender. Consider having supervision and share cooking responsibilities until no longer able to participate in those. Accidents with firearms and other hazards in the house should be identified and addressed as well.   ABILITY TO BE LEFT ALONE: If patient is unable to contact 911 operator, consider using LifeLine, or when the need is there, arrange for someone to stay with patients. Smoking is a fire hazard, consider supervision or cessation. Risk of wandering should be assessed by caregiver and if detected at any point, supervision and safe proof recommendations should be instituted.  MEDICATION SUPERVISION: Inability to self-administer medication needs to be constantly addressed. Implement a mechanism to ensure safe administration of the medications.   DRIVING: Regarding driving, in patients with progressive memory problems, driving will be impaired. We advise to have someone else do the driving if trouble finding directions or if minor accidents are reported. Independent driving assessment is available to determine safety of driving.   If you are interested in the driving assessment, you can  contact the following:  [facility] [employer] [facility] in [place] [phone]  [facility] [phone]  [facility] [phone] or  [phone]    Mediterranean Diet A Mediterranean diet refers to food and lifestyle choices that are based on the traditions of countries located on the [employer] [place]. This way of eating has been shown to help prevent certain conditions and improve outcomes for people who have chronic diseases, like kidney disease and heart disease. What are tips for following this plan? Lifestyle  Cook and eat meals together with your family, when possible. Drink enough fluid to keep your urine clear or pale yellow. Be physically active every day. This includes: Aerobic exercise like running or swimming. Leisure activities like gardening, walking, or housework. Get 7-8 hours of sleep each night. If recommended by your health care provider, drink red wine in moderation. This means 1 glass a day for nonpregnant women and 2 glasses a day for men. A glass of wine equals 5 oz (150 mL). Reading food labels  Check the serving size of packaged foods. For foods such as rice and pasta, the serving size refers to the amount of cooked product, not dry. Check the total fat in packaged foods. Avoid foods that have saturated fat or trans fats. Check the ingredients list for added sugars, such as corn syrup. Shopping  At the grocery store, buy most of your food from the areas near the walls of the store. This includes: Fresh fruits and vegetables (produce). Grains, beans, nuts, and seeds. Some of these may be available in unpackaged forms or large amounts (in bulk). Fresh seafood. Poultry and eggs. Low-fat dairy products. Buy whole ingredients instead of prepackaged foods. Buy fresh fruits and vegetables in-season from local farmers markets. Buy frozen fruits and vegetables in resealable bags. If you do not have access to quality fresh seafood, buy precooked frozen shrimp or canned fish, such as tuna, salmon, or sardines. Buy small amounts of raw or cooked vegetables, salads, or olives from the deli or salad bar  at your store. Stock your pantry so you always have certain foods on hand, such as olive oil, canned tuna, canned tomatoes, rice, pasta, and beans. Cooking  Cook foods with extra-virgin olive oil instead of using butter or other vegetable oils. Have meat as a side dish, and have vegetables or grains as your main dish. This means having meat in small portions or adding small amounts of meat to foods like pasta or stew. Use beans or vegetables instead of meat in common dishes like chili or lasagna. Experiment with different cooking methods. Try roasting or broiling vegetables instead of steaming or sauteing them. Add frozen vegetables to soups, stews, pasta, or rice. Add nuts or seeds for added healthy fat at each meal. You can add these to yogurt, salads, or vegetable dishes. Marinate fish or vegetables using olive oil, lemon juice, garlic, and fresh herbs. Meal planning  Plan to eat 1 vegetarian meal one day each week. Try to work up to 2 vegetarian meals, if possible. Eat seafood 2 or more times a week. Have healthy snacks readily available, such as: Vegetable sticks with hummus. Greek yogurt. Fruit and nut trail mix. Eat balanced meals throughout the week. This includes: Fruit: 2-3 servings a day Vegetables: 4-5 servings a day Low-fat dairy: 2 servings a day Fish, poultry, or lean meat: 1 serving a day Beans and legumes: 2 or more servings a week Nuts and seeds: 1-2 servings a day  Whole grains: 6-8 servings a day Extra-virgin olive oil: 3-4 servings a day Limit red meat and sweets to only a few servings a month What are my food choices? Mediterranean diet Recommended Grains: Whole-grain pasta. Brown rice. Bulgar wheat. Polenta. Couscous. Whole-wheat bread. [place] Madeira. Vegetables: Artichokes. Beets. Broccoli. Cabbage. Carrots. Eggplant. Green beans. Chard. Kale. Spinach. Onions. Leeks. Peas. Squash. Tomatoes. Peppers. Radishes. Fruits: Apples. Apricots. Avocado. Berries.  Bananas. Cherries. Dates. Figs. Grapes. Lemons. Melon. Oranges. Peaches. Plums. Pomegranate. Meats and other protein foods: Beans. Almonds. Sunflower seeds. Pine nuts. Peanuts. Cod. Salmon. Scallops. Shrimp. Tuna. Tilapia. Clams. Oysters. Eggs. Dairy: Low-fat milk. Cheese. Greek yogurt. Beverages: Water. Red wine. Herbal tea. Fats and oils: Extra virgin olive oil. Avocado oil. Grape seed oil. Sweets and desserts: Greek yogurt with honey. Baked apples. Poached pears. Trail mix. Seasoning and other foods: Basil. Cilantro. Coriander. Cumin. Mint. Parsley. Sage. Rosemary. Tarragon. Garlic. Oregano. Thyme. Pepper. Balsalmic vinegar. Tahini. Hummus. Tomato sauce. Olives. Mushrooms. Limit these Grains: Prepackaged pasta or rice dishes. Prepackaged cereal with added sugar. Vegetables: Deep fried potatoes (french fries). Fruits: Fruit canned in syrup. Meats and other protein foods: Beef. Pork. Lamb. Poultry with skin. Hot dogs. [place]. Dairy: Ice cream. Sour cream. Whole milk. Beverages: Juice. Sugar-sweetened soft drinks. Beer. Liquor and spirits. Fats and oils: Butter. Canola oil. Vegetable oil. Beef fat (tallow). Lard. Sweets and desserts: Cookies. Cakes. Pies. Candy. Seasoning and other foods: Mayonnaise. Premade sauces and marinades. The items listed may not be a complete list. Talk with your dietitian about what dietary choices are right for you. Summary The Mediterranean diet includes both food and lifestyle choices. Eat a variety of fresh fruits and vegetables, beans, nuts, seeds, and whole grains. Limit the amount of red meat and sweets that you eat. Talk with your health care provider about whether it is safe for you to drink red wine in moderation. This means 1 glass a day for nonpregnant women and 2 glasses a day for men. A glass of wine equals 5 oz (150 mL). This information is not intended to replace advice given to you by your health care provider. Make sure you discuss any questions you  have with your health care provider. Document Released: 05/10/2016 Document Revised: 06/12/2016 Document Reviewed: 05/10/2016 Elsevier Interactive Patient Education  2017 [employer].

## 2025-05-21 ENCOUNTER — Ambulatory Visit: Payer: Self-pay | Admitting: Physician Assistant
